# Patient Record
Sex: Female | Born: 1975
Health system: Southern US, Community
[De-identification: ages and names within clinical notes are randomized; demographics above are authoritative.]

## PROBLEM LIST (undated history)

## (undated) DIAGNOSIS — T7840XA Allergy, unspecified, initial encounter: Secondary | ICD-10-CM

## (undated) DIAGNOSIS — J302 Other seasonal allergic rhinitis: Secondary | ICD-10-CM

## (undated) DIAGNOSIS — J45909 Unspecified asthma, uncomplicated: Secondary | ICD-10-CM

## (undated) DIAGNOSIS — Z6791 Unspecified blood type, Rh negative: Secondary | ICD-10-CM

## (undated) DIAGNOSIS — F329 Major depressive disorder, single episode, unspecified: Secondary | ICD-10-CM

## (undated) DIAGNOSIS — R51 Headache: Secondary | ICD-10-CM

## (undated) DIAGNOSIS — J42 Unspecified chronic bronchitis: Secondary | ICD-10-CM

## (undated) DIAGNOSIS — M19071 Primary osteoarthritis, right ankle and foot: Secondary | ICD-10-CM

## (undated) DIAGNOSIS — F1910 Other psychoactive substance abuse, uncomplicated: Secondary | ICD-10-CM

## (undated) HISTORY — DX: Allergy, unspecified, initial encounter: T78.40XA

## (undated) HISTORY — DX: Other psychoactive substance abuse, uncomplicated: F19.10

## (undated) HISTORY — DX: Major depressive disorder, single episode, unspecified: F32.9

## (undated) HISTORY — DX: Unspecified blood type, rh negative: Z67.91

## (undated) HISTORY — PX: BREAST BIOPSY: SHX20

## (undated) HISTORY — DX: Headache: R51

---

## 1998-07-12 ENCOUNTER — Encounter: Admission: RE | Admit: 1998-07-12 | Discharge: 1998-07-12 | Payer: Self-pay | Admitting: *Deleted

## 1998-10-07 DIAGNOSIS — F32A Depression, unspecified: Secondary | ICD-10-CM

## 1998-10-07 HISTORY — DX: Depression, unspecified: F32.A

## 2000-10-16 ENCOUNTER — Encounter: Payer: Self-pay | Admitting: Obstetrics & Gynecology

## 2000-10-16 ENCOUNTER — Encounter: Admission: RE | Admit: 2000-10-16 | Discharge: 2000-10-16 | Payer: Self-pay | Admitting: Obstetrics & Gynecology

## 2000-10-16 ENCOUNTER — Emergency Department (HOSPITAL_COMMUNITY): Admission: EM | Admit: 2000-10-16 | Discharge: 2000-10-16 | Payer: Self-pay | Admitting: Emergency Medicine

## 2001-09-03 ENCOUNTER — Emergency Department (HOSPITAL_COMMUNITY): Admission: EM | Admit: 2001-09-03 | Discharge: 2001-09-03 | Payer: Self-pay | Admitting: Emergency Medicine

## 2001-09-03 ENCOUNTER — Encounter: Payer: Self-pay | Admitting: Emergency Medicine

## 2001-12-17 ENCOUNTER — Emergency Department (HOSPITAL_COMMUNITY): Admission: EM | Admit: 2001-12-17 | Discharge: 2001-12-17 | Payer: Self-pay

## 2004-12-18 ENCOUNTER — Emergency Department (HOSPITAL_COMMUNITY): Admission: EM | Admit: 2004-12-18 | Discharge: 2004-12-18 | Payer: Self-pay | Admitting: Emergency Medicine

## 2008-09-20 ENCOUNTER — Emergency Department (HOSPITAL_COMMUNITY): Admission: EM | Admit: 2008-09-20 | Discharge: 2008-09-20 | Payer: Self-pay | Admitting: Emergency Medicine

## 2009-05-22 ENCOUNTER — Encounter: Admission: RE | Admit: 2009-05-22 | Discharge: 2009-05-22 | Payer: Self-pay | Admitting: Obstetrics and Gynecology

## 2011-08-08 ENCOUNTER — Other Ambulatory Visit: Payer: Self-pay | Admitting: Podiatrist

## 2011-08-08 DIAGNOSIS — M25571 Pain in right ankle and joints of right foot: Secondary | ICD-10-CM

## 2011-08-09 ENCOUNTER — Other Ambulatory Visit: Payer: Self-pay

## 2011-08-09 ENCOUNTER — Ambulatory Visit
Admission: RE | Admit: 2011-08-09 | Discharge: 2011-08-09 | Disposition: A | Discharge status: Home / Self Care | Payer: Federal, State, Local not specified - PPO | Source: Ambulatory Visit | Attending: Podiatrist | Admitting: Podiatrist

## 2011-08-09 DIAGNOSIS — M25571 Pain in right ankle and joints of right foot: Secondary | ICD-10-CM

## 2012-02-25 ENCOUNTER — Ambulatory Visit (INDEPENDENT_AMBULATORY_CARE_PROVIDER_SITE_OTHER): Payer: Federal, State, Local not specified - PPO | Admitting: Obstetrics and Gynecology

## 2012-02-25 ENCOUNTER — Encounter: Payer: Self-pay | Admitting: Obstetrics and Gynecology

## 2012-02-25 VITALS — BP 112/72 | Temp 98.1°F | Ht 60.5 in | Wt 179.0 lb

## 2012-02-25 DIAGNOSIS — N644 Mastodynia: Secondary | ICD-10-CM

## 2012-02-25 DIAGNOSIS — N63 Unspecified lump in unspecified breast: Secondary | ICD-10-CM

## 2012-02-25 NOTE — Progress Notes (Signed)
36 YO complains of pain in right breast x 2 weeks. Pain is achy, occurs off & on throughout the day.  No obvious trigger.  Has worked for the [employer] x 8 years with lots  upper body activity.  Denies trauma to breast, FH breast problems or nipple discharge.  Admits to having breast tissue removed from right breast-benign. Denies chest pain or shortness of breath though she does on occasion have "a cough" (alternating years).   O: Chest: clear to auscultation       Breast: right breast: tender lipomatous appearing  mass        at 10 o'clock adjacent to previous breast biopsy scar; no       additional masses, tenderness, retractions, skin changes       or nipple discharge; no adenpathy.        left breast: no masses, discharge, skin changes, nipple       retraction or adenopathy  A:  Right Breast Pain/Mass      S/P Right Breast Mass Excision   P: Diagnostic Mammogram with ultrasound as needed.     Patient may also want a surgical consultation   Rasaan Brotherton, PA-C

## 2012-03-06 ENCOUNTER — Other Ambulatory Visit: Payer: Self-pay | Admitting: Obstetrics and Gynecology

## 2012-03-06 ENCOUNTER — Ambulatory Visit
Admission: RE | Admit: 2012-03-06 | Discharge: 2012-03-06 | Disposition: A | Discharge status: Home / Self Care | Payer: Federal, State, Local not specified - PPO | Source: Ambulatory Visit | Attending: Obstetrics and Gynecology | Admitting: Obstetrics and Gynecology

## 2012-03-06 DIAGNOSIS — N644 Mastodynia: Secondary | ICD-10-CM

## 2012-03-06 DIAGNOSIS — N63 Unspecified lump in unspecified breast: Secondary | ICD-10-CM

## 2012-03-11 ENCOUNTER — Ambulatory Visit: Payer: Self-pay | Admitting: Obstetrics and Gynecology

## 2012-04-28 ENCOUNTER — Encounter: Payer: Self-pay | Admitting: Obstetrics and Gynecology

## 2012-04-28 ENCOUNTER — Ambulatory Visit (INDEPENDENT_AMBULATORY_CARE_PROVIDER_SITE_OTHER): Payer: Federal, State, Local not specified - PPO | Admitting: Obstetrics and Gynecology

## 2012-04-28 VITALS — BP 118/72 | Ht 60.0 in | Wt 180.0 lb

## 2012-04-28 DIAGNOSIS — Z01419 Encounter for gynecological examination (general) (routine) without abnormal findings: Secondary | ICD-10-CM

## 2012-04-28 NOTE — Progress Notes (Signed)
Last Pap: 09/20/2010  WNL: Yes Regular Periods:yes Contraception: none   Monthly Breast exam:yes Tetanus<79yrs:?  Nl.Bladder Function:yes Daily BMs:yes Healthy Diet:no Calcium:yes Mammogram:yes Date of Mammogram: 03/06/2012 Exercise:no Have often Exercise: no  Seatbelt: yes Abuse at home: no Stressful work:no Sigmoid-colonoscopy: no  BP 118/72  Ht 5' (1.524 m)  Wt 180 lb (81.647 kg)  BMI 35.15 kg/m2  LMP 04/10/2012 Pt without complaints Physical Examination: General appearance - alert, well appearing, and in no distress Mental status - normal mood, behavior, speech, dress, motor activity, and thought processes Neck - supple, no significant adenopathy, thyroid exam: thyroid is normal in size without nodules or tenderness Chest - clear to auscultation, no wheezes, rales or rhonchi, symmetric air entry Heart - normal rate and regular rhythm Abdomen - soft, nontender, nondistended, no masses or organomegaly Breasts - breasts appear normal, no suspicious masses, no skin or nipple changes or axillary nodes Pelvic - normal external genitalia, vulva, vagina, cervix, uterus and adnexa Rectal - normal rectal, no masses, rectal exam not indicated Back exam - full range of motion, no tenderness, palpable spasm or pain on motion Neurological - alert, oriented, normal speech, no focal findings or movement disorder noted Musculoskeletal - no joint tenderness, deformity or swelling Extremities - no edema, redness or tenderness in the calves or thighs Skin - normal coloration and turgor, no rashes, no suspicious skin lesions noted Routine exam Pap sent yes Mammogram due no pt is abstinant RT 1 yr  Bone Density: No PCP: none  Change in PMH: unchanged Change in BJY:[id number]

## 2012-04-29 LAB — PAP IG W/ RFLX HPV ASCU

## 2012-10-07 DIAGNOSIS — N63 Unspecified lump in unspecified breast: Secondary | ICD-10-CM | POA: Insufficient documentation

## 2012-10-10 ENCOUNTER — Encounter (HOSPITAL_COMMUNITY): Payer: Self-pay | Admitting: *Deleted

## 2012-10-10 ENCOUNTER — Emergency Department (HOSPITAL_COMMUNITY)
Admission: EM | Admit: 2012-10-10 | Discharge: 2012-10-10 | Disposition: A | Discharge status: Home / Self Care | Payer: Federal, State, Local not specified - PPO | Source: Home / Self Care

## 2012-10-10 DIAGNOSIS — J9801 Acute bronchospasm: Secondary | ICD-10-CM

## 2012-10-10 DIAGNOSIS — R0982 Postnasal drip: Secondary | ICD-10-CM

## 2012-10-10 DIAGNOSIS — J069 Acute upper respiratory infection, unspecified: Secondary | ICD-10-CM

## 2012-10-10 MED ORDER — ALBUTEROL SULFATE HFA 108 (90 BASE) MCG/ACT IN AERS: 1.0000 | INHALATION_SPRAY | Freq: Four times a day (QID) | RESPIRATORY_TRACT | Status: DC | PRN

## 2012-10-10 NOTE — ED Notes (Signed)
Has been taking Dayquil and Alka-Seltzer Plus.

## 2012-10-10 NOTE — ED Notes (Signed)
On Monday started with back ache, body aches, scratchy throat, slightly productive cough without fevers.  All sxs have gone away except continued cough.  States she feels "clammy".  BBS clear.

## 2012-10-10 NOTE — ED Provider Notes (Signed)
History     CSN: [id number]  Arrival date & time 10/10/12  1120   None     Chief Complaint  Patient presents with  . Cough    (Consider location/radiation/quality/duration/timing/severity/associated sxs/prior treatment) HPI Comments: 37 year old female who 6 days ago had myalgias and scratchy throat. She is improved with the symptoms but continues to have a stuffy nose and a cough. Shelf and feels cold. Denies recent fevers.   Past Medical History  Diagnosis Date  . Yeast infection   . Bacterial infection   . Blood type, Rh negative     History reviewed. No pertinent past surgical history.  Family History  Problem Relation Age of Onset  . Hypertension Paternal Grandmother   . Diabetes Paternal Grandmother   . Heart disease Maternal Grandmother     9 bypass surgery  . Cancer Maternal Grandmother     mouth  . Sickle cell trait Father   . Seizures Father   . Thyroid disease Mother     as a child  . Hypertension Mother   . Anemia Mother   . Migraines Mother   . Hernia Mother   . Arthritis Maternal Aunt     History  Substance Use Topics  . Smoking status: Never Smoker   . Smokeless tobacco: Never Used  . Alcohol Use: Yes     Comment: Drinks 3x daily    OB History    Grav Para Term Preterm Abortions TAB SAB Ect Mult Living   9 0   8  1   0      Review of Systems  Constitutional: Negative.  Negative for fever, chills, activity change, appetite change and fatigue.  HENT: Positive for rhinorrhea. Negative for congestion, sore throat, facial swelling, neck pain, neck stiffness and postnasal drip.         As per history of present illness  Eyes: Negative.   Respiratory: Negative.   Cardiovascular: Negative.   Skin: Negative for pallor and rash.  Neurological: Negative.     Allergies  Review of patient's allergies indicates no known allergies.  Home Medications   Current Outpatient Rx  Name  Route  Sig  Dispense  Refill  . ALBUTEROL SULFATE HFA 108 (90  BASE) MCG/ACT IN AERS   Inhalation   Inhale 1-2 puffs into the lungs every 6 (six) hours as needed for wheezing.   1 Inhaler   0   . ONE-DAILY MULTI VITAMINS PO TABS   Oral   Take 1 tablet by mouth daily.           BP 131/83  Pulse 83  Temp 98.9 F (37.2 C) (Oral)  Resp 18  SpO2 100%  LMP 10/06/2012  Physical Exam  Nursing note and vitals reviewed. Constitutional: She is oriented to person, place, and time. She appears well-developed and well-nourished. No distress.  HENT:  Head: Normocephalic.       Minor erythema of the posterior oropharynx. More and streaky type fashion. No exudates or edema Clear PND Bilateral TMs are normal  Eyes: Conjunctivae normal and EOM are normal.  Neck: Normal range of motion. Neck supple.  Cardiovascular: Normal rate and regular rhythm.   No murmur heard. Pulmonary/Chest: Effort normal and breath sounds normal. No respiratory distress. She has no rales.       With cough and forced expiration there is a faint and expiratory coarseness.  Musculoskeletal: Normal range of motion.  Lymphadenopathy:    She has no cervical adenopathy.  Neurological: She  is alert and oriented to person, place, and time. No cranial nerve deficit.  Skin: Skin is warm and dry.  Psychiatric: She has a normal mood and affect.    ED Course  Procedures (including critical care time)  Labs Reviewed - No data to display No results found.   1. URI (upper respiratory infection)   2. Bronchospasm   3. PND (post-nasal drip)       MDM  Patient has minor bronchospasm we will treat with albuterol HFA 2 puffs every 4-6 hours when necessary cough and wheeze. Claritin 10 mg once daily for drainage Tylenol every 4 hours as needed for discomfort or fever For any worsening, new symptoms or problems may return. Patient is discharged in a stable condition.         Hayden Rasmussen, NP 10/10/12 1215

## 2012-10-13 NOTE — ED Provider Notes (Signed)
Medical screening examination/treatment/procedure(s) were performed by resident physician or non-physician practitioner and as supervising physician I was immediately available for consultation/collaboration.   Barkley Bruns MD.    Linna Hoff, MD 10/13/12 [phone]

## 2013-01-27 ENCOUNTER — Ambulatory Visit (INDEPENDENT_AMBULATORY_CARE_PROVIDER_SITE_OTHER): Payer: Federal, State, Local not specified - PPO | Admitting: Emergency Medicine

## 2013-01-27 VITALS — BP 108/78 | HR 98 | Temp 99.0°F | Resp 16 | Ht 60.5 in | Wt 189.8 lb

## 2013-01-27 DIAGNOSIS — Z0000 Encounter for general adult medical examination without abnormal findings: Secondary | ICD-10-CM

## 2013-01-27 DIAGNOSIS — I8390 Asymptomatic varicose veins of unspecified lower extremity: Secondary | ICD-10-CM

## 2013-01-27 DIAGNOSIS — Z789 Other specified health status: Secondary | ICD-10-CM

## 2013-01-27 DIAGNOSIS — R002 Palpitations: Secondary | ICD-10-CM

## 2013-01-27 DIAGNOSIS — F1010 Alcohol abuse, uncomplicated: Secondary | ICD-10-CM

## 2013-01-27 DIAGNOSIS — N898 Other specified noninflammatory disorders of vagina: Secondary | ICD-10-CM

## 2013-01-27 DIAGNOSIS — N939 Abnormal uterine and vaginal bleeding, unspecified: Secondary | ICD-10-CM

## 2013-01-27 DIAGNOSIS — R200 Anesthesia of skin: Secondary | ICD-10-CM

## 2013-01-27 DIAGNOSIS — D72829 Elevated white blood cell count, unspecified: Secondary | ICD-10-CM

## 2013-01-27 LAB — PROTIME-INR: INR: 0.95 (ref <1.50)

## 2013-01-27 LAB — COMPREHENSIVE METABOLIC PANEL
ALT: 15 U/L (ref 0–35)
Albumin: 4.4 g/dL (ref 3.5–5.2)
CO2: 21 mEq/L (ref 19–32)
Calcium: 9.8 mg/dL (ref 8.4–10.5)
Chloride: 100 mEq/L (ref 96–112)
Glucose, Bld: 89 mg/dL (ref 70–99)
Potassium: 3.8 mEq/L (ref 3.5–5.3)
Sodium: 132 mEq/L — ABNORMAL LOW (ref 135–145)
Total Bilirubin: 0.7 mg/dL (ref 0.3–1.2)
Total Protein: 8 g/dL (ref 6.0–8.3)

## 2013-01-27 LAB — POCT CBC
Granulocyte percent: 73.7 %G (ref 37–80)
Hemoglobin: 12.6 g/dL (ref 12.2–16.2)
MCH, POC: 26.6 pg — AB (ref 27–31.2)
MCV: 82.8 fL (ref 80–97)
MPV: 10.1 fL (ref 0–99.8)
RBC: 4.73 M/uL (ref 4.04–5.48)
WBC: 12.8 10*3/uL — AB (ref 4.6–10.2)

## 2013-01-27 LAB — POCT URINALYSIS DIPSTICK
Glucose, UA: NEGATIVE
Leukocytes, UA: NEGATIVE
Spec Grav, UA: >=1.03
Urobilinogen, UA: 0.2

## 2013-01-27 LAB — POCT UA - MICROSCOPIC ONLY: Yeast, UA: NEGATIVE

## 2013-01-27 LAB — LIPID PANEL
Cholesterol: 188 mg/dL (ref 0–200)
Triglycerides: 92 mg/dL (ref <150)

## 2013-01-27 LAB — LIPASE: Lipase: 19 U/L (ref 0–75)

## 2013-01-27 LAB — AMYLASE: Amylase: 61 U/L (ref 0–105)

## 2013-01-27 NOTE — Progress Notes (Addendum)
Subjective:    Patient ID: Sherri Buckley, female    DOB: Oct 29, 1975, 37 y.o.   MRN: [id number]  HPI Patient here today for a physical exam.  Patient states she does see an OBGYN for pap exams.  Patient concerned with vericose veins on her legs. Complains of palpitations also. EKG obtained in office today to evaluate this. Has appointment next week for endometrial biopsy, advised to avoid aspirin or NSAIDs until after this is done.    Review of Systems     Objective:   Physical Exam HEENT exam is unremarkable TMs are clear. The nose is normal. Throat is normal to chest is clear to both auscultation and percussion heart regular rate without murmurs rubs or gallops the abdomen is soft without tenderness to extremity exam reveals multiple spider varicosities of both legs. Results for orders placed in visit on 01/27/13  POCT CBC      Result Value Range   WBC 12.8 (*) 4.6 - 10.2 K/uL   Lymph, poc 2.5  0.6 - 3.4   POC LYMPH PERCENT 19.5  10 - 50 %L   MID (cbc) 0.9  0 - 0.9   POC MID % 6.8  0 - 12 %M   POC Granulocyte 9.4 (*) 2 - 6.9   Granulocyte percent 73.7  37 - 80 %G   RBC 4.73  4.04 - 5.48 M/uL   Hemoglobin 12.6  12.2 - 16.2 g/dL   HCT, POC 40.9  81.1 - 47.9 %   MCV 82.8  80 - 97 fL   MCH, POC 26.6 (*) 27 - 31.2 pg   MCHC 32.1  31.8 - 35.4 g/dL   RDW, POC 91.4     Platelet Count, POC 322  142 - 424 K/uL   MPV 10.1  0 - 99.8 fL  GLUCOSE, POCT (MANUAL RESULT ENTRY)      Result Value Range   POC Glucose 82  70 - 99 mg/dl  POCT URINALYSIS DIPSTICK      Result Value Range   Color, UA yellow     Clarity, UA hazy     Glucose, UA neg     Bilirubin, UA neg     Ketones, UA 40     Spec Grav, UA >=1.030     Blood, UA neg     pH, UA 5.5     Protein, UA tr     Urobilinogen, UA 0.2     Nitrite, UA neg     Leukocytes, UA Negative    POCT UA - MICROSCOPIC ONLY      Result Value Range   WBC, Ur, HPF, POC 1-3     RBC, urine, microscopic 0-2     Bacteria, U Microscopic small     Mucus, UA small     Epithelial cells, urine per micros 3-6     Crystals, Ur, HPF, POC neg     Casts, Ur, LPF, POC neg     Yeast, UA neg     EKG NSR      Assessment & Plan:  Discussed with patient she needs to decrease alcohol intake. Encouraged AA meetings, advised to speak to substance abuse professional and try to eliminate the alcohol intake. Patient will check status of last Tdap and contact our office if she is due for this. CT scan of head to evaluate her complaints of numbness of arms and legs. It is unclear to me why her white count is up. She does not have any signs  of infection. Daughter past reviewed on her blood work as well as routine labs that were done .

## 2013-01-28 ENCOUNTER — Emergency Department (HOSPITAL_COMMUNITY)
Admission: EM | Admit: 2013-01-28 | Discharge: 2013-01-28 | Disposition: A | Discharge status: Home / Self Care | Payer: Federal, State, Local not specified - PPO | Attending: Emergency Medicine | Admitting: Emergency Medicine

## 2013-01-28 ENCOUNTER — Ambulatory Visit
Admission: RE | Admit: 2013-01-28 | Discharge: 2013-01-28 | Disposition: A | Discharge status: Home / Self Care | Payer: Federal, State, Local not specified - PPO | Source: Ambulatory Visit | Attending: Emergency Medicine | Admitting: Emergency Medicine

## 2013-01-28 ENCOUNTER — Encounter: Payer: Self-pay | Admitting: Family Medicine

## 2013-01-28 ENCOUNTER — Encounter (HOSPITAL_COMMUNITY): Payer: Self-pay

## 2013-01-28 DIAGNOSIS — Z8619 Personal history of other infectious and parasitic diseases: Secondary | ICD-10-CM | POA: Insufficient documentation

## 2013-01-28 DIAGNOSIS — R05 Cough: Secondary | ICD-10-CM | POA: Insufficient documentation

## 2013-01-28 DIAGNOSIS — Z8659 Personal history of other mental and behavioral disorders: Secondary | ICD-10-CM | POA: Insufficient documentation

## 2013-01-28 DIAGNOSIS — R059 Cough, unspecified: Secondary | ICD-10-CM | POA: Insufficient documentation

## 2013-01-28 DIAGNOSIS — R200 Anesthesia of skin: Secondary | ICD-10-CM

## 2013-01-28 DIAGNOSIS — R209 Unspecified disturbances of skin sensation: Secondary | ICD-10-CM | POA: Insufficient documentation

## 2013-01-28 DIAGNOSIS — IMO0001 Reserved for inherently not codable concepts without codable children: Secondary | ICD-10-CM | POA: Insufficient documentation

## 2013-01-28 DIAGNOSIS — F1910 Other psychoactive substance abuse, uncomplicated: Secondary | ICD-10-CM | POA: Insufficient documentation

## 2013-01-28 DIAGNOSIS — R202 Paresthesia of skin: Secondary | ICD-10-CM

## 2013-01-28 DIAGNOSIS — R079 Chest pain, unspecified: Secondary | ICD-10-CM | POA: Insufficient documentation

## 2013-01-28 DIAGNOSIS — Z8739 Personal history of other diseases of the musculoskeletal system and connective tissue: Secondary | ICD-10-CM | POA: Insufficient documentation

## 2013-01-28 LAB — SICKLE CELL SCREEN: Sickle Cell Screen: NEGATIVE

## 2013-01-28 LAB — PATHOLOGIST SMEAR REVIEW

## 2013-01-28 MED ORDER — ASPIRIN EC 81 MG PO TBEC: 81.0000 mg | DELAYED_RELEASE_TABLET | Freq: Every day | ORAL | Status: DC

## 2013-01-28 NOTE — ED Provider Notes (Signed)
I saw and evaluated the patient, reviewed the resident's note and I agree with the findings and plan.  I personally evaluated the ECG and agree with the interpretation of the resident  The patient is well-appearing.  She has a completely normal neurologic exam at this time.  She does report some abnormal sensations in her left arm and leg which are described as "tingling".  She has no numbness to pinprick or light touch on exam.  She does not need an MRI at this time.  I do not believe this to be a stroke.  She was referred to neurology for ongoing workup.  Home on an aspirin.   Lyanne Co, MD 01/28/13 1534

## 2013-01-28 NOTE — ED Notes (Signed)
Pt c/o numbness to left side of face and arm. Dull and sharp sensation intact.

## 2013-01-28 NOTE — ED Notes (Signed)
Pt presents with 2 day h/o numbness to L side.  Pt reports she went to Urgent Care yesterday morning "for my annual exam", told provider about the numbness and was referred for CT scan of which she just had.  Pt reports she had abnormal vaginal bleeding x 2 weeks ago "with blood clots" and is scheduled for biopsy 4/29.  Pt reports she came here "because I got scared and didn't want to wait for the results", reports labs showed slightly low sodium and elevated WBC.

## 2013-01-28 NOTE — ED Provider Notes (Signed)
History     CSN: [id number]  Arrival date & time 01/28/13  1334   First MD Initiated Contact with Patient 01/28/13 1342      No chief complaint on file.   (Consider location/radiation/quality/duration/timing/severity/associated sxs/prior treatment) HPI  Patient presents with left sided face, arm, and leg tingling.  The arm tingling has been going on several days but the face tingling is new.  She says she saw Dr. Cleta Alberts for a physical yesterday and complained about this as well as palpitations.  He ordered a CT head which she had done.  She says the tingling in her face made her worry about a stroke so she came to the ER after the CT scan.  She denies weakness in left arm or leg, and denies facial droop, difficulty speaking, or other focal neurological deficits. She endorses several other complaints on ROS- including cough, mild chest pain, muscle aches.  She says that she is a heavy drinker but has been cutting back lately.  She says she used to drink 1/2 liter of vodka in 2 days.  She says she has been cutting back, drinking moscoto wine over the past few weeks but has not had a drink in 3 days. She says her sodium was low yesterday at Dr. Ellis Parents office and her white count was mildly elevated.   Past Medical History  Diagnosis Date  . Yeast infection   . Bacterial infection   . Blood type, Rh negative   . Allergy   . Arthritis   . Depression   . Substance abuse     Past Surgical History  Procedure Laterality Date  . Breast surgery      Family History  Problem Relation Age of Onset  . Hypertension Paternal Grandmother   . Diabetes Paternal Grandmother   . Heart disease Maternal Grandmother     9 bypass surgery  . Cancer Maternal Grandmother     mouth  . Sickle cell trait Father   . Seizures Father   . Thyroid disease Mother     as a child  . Hypertension Mother   . Anemia Mother   . Migraines Mother   . Hernia Mother   . Arthritis Maternal Aunt     History   Substance Use Topics  . Smoking status: Never Smoker   . Smokeless tobacco: Never Used  . Alcohol Use: Yes     Comment: Drinks 3x daily    OB History   Grav Para Term Preterm Abortions TAB SAB Ect Mult Living   9 0   8  1   0      Review of Systems See HPI Allergies  Review of patient's allergies indicates no known allergies.  Home Medications  No current outpatient prescriptions on file.  BP 140/85  Pulse 84  Temp(Src) 98.7 F (37.1 C) (Oral)  Resp 18  SpO2 99%  LMP 12/31/2012  Physical Exam  Constitutional: She is oriented to person, place, and time. She appears well-developed and well-nourished. No distress.  HENT:  Head: Normocephalic and atraumatic.  Mouth/Throat: Oropharynx is clear and moist.  Eyes: EOM are normal. Pupils are equal, round, and reactive to light.  Neck: Normal range of motion. Neck supple.  Cardiovascular: Normal rate, regular rhythm and normal heart sounds.   Pulmonary/Chest: Effort normal and breath sounds normal. She has no rales.  Abdominal: Soft. Bowel sounds are normal.  Musculoskeletal: She exhibits no edema.  Neurological: She is alert and oriented to person, place, and  time. She has normal strength. No cranial nerve deficit or sensory deficit.  Skin: No rash noted.    ED Course  Procedures (including critical care time)  Labs Reviewed - No data to display Ct Head Wo Contrast  01/28/2013  *RADIOLOGY REPORT*  Clinical Data: Numbness in extremities.  Headache.  CT HEAD WITHOUT CONTRAST  Technique:  Contiguous axial images were obtained from the base of the skull through the vertex without contrast.  Comparison: None  Findings: Ventricle size is normal.  Negative for intracranial hemorrhage.  Negative for infarct or mass lesion.  No fluid collection or shift of the midline structures.  No acute bony abnormality.  There is mucosal edema in the paranasal sinuses.  IMPRESSION: Normal CT brain.  Mucosal disease in the ethmoid sinuses.    Original Report Authenticated By: Janeece Riggers, M.D.     Date: 01/28/2013  Rate: 87  Rhythm: normal sinus rhythm  QRS Axis: normal  Intervals: normal  ST/T Wave abnormalities: normal  Conduction Disutrbances:none  Narrative Interpretation: Normal EKG  Old EKG Reviewed: unchanged    1. Tingling sensation       MDM  Patient with normal neurologic exam, CT head normal.  Will have her start ASA 81 daily and follow up with Neurology.        Ardyth Gal, MD 01/28/13 1426

## 2013-01-29 ENCOUNTER — Telehealth: Payer: Self-pay

## 2013-01-29 NOTE — Telephone Encounter (Signed)
Pt would like a copy of her ct scan results mailed to her home address  Best number [phone]

## 2013-01-29 NOTE — Telephone Encounter (Signed)
done

## 2013-02-02 ENCOUNTER — Other Ambulatory Visit: Payer: Self-pay | Admitting: Obstetrics and Gynecology

## 2013-02-05 ENCOUNTER — Ambulatory Visit (INDEPENDENT_AMBULATORY_CARE_PROVIDER_SITE_OTHER): Payer: Federal, State, Local not specified - PPO | Admitting: Neurology

## 2013-02-05 ENCOUNTER — Encounter: Payer: Self-pay | Admitting: Neurology

## 2013-02-05 VITALS — BP 145/86 | HR 80 | Temp 98.5°F | Ht 62.0 in | Wt 195.0 lb

## 2013-02-05 DIAGNOSIS — R209 Unspecified disturbances of skin sensation: Secondary | ICD-10-CM

## 2013-02-05 DIAGNOSIS — IMO0001 Reserved for inherently not codable concepts without codable children: Secondary | ICD-10-CM
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
## 2013-02-05 NOTE — Patient Instructions (Addendum)
I think overall you are doing fairly well but I do want to suggest a few things today:  I will order an MRI brain and blood work today.   Remember to drink plenty of fluid, eat healthy meals and do not skip any meals. Try to eat protein with a every meal and eat a healthy snack such as fruit or nuts in between meals. Try to keep a regular sleep-wake schedule and try to exercise daily, particularly in the form of walking, 20-30 minutes a day, if you can.   As far as your medications are concerned, I would like to suggest no changes today.   I would like to see you back in 3 months, sooner if we need to. Please call us with any interim questions, concerns, problems, updates or refill requests.  Please also call us for any test results so we can go over those with you on the phone. Brett Canales is my clinical assistant and will answer any of your questions and relay your messages to me and also relay most of my messages to you.  Our phone number is [phone]. We also have an after hours call service for urgent matters and there is a physician on-call for urgent questions. For any emergencies you know to call 911 or go to the nearest emergency room.

## 2013-02-08 LAB — COMPREHENSIVE METABOLIC PANEL
AST: 21 IU/L (ref 0–40)
Albumin: 4 g/dL (ref 3.5–5.5)
Alkaline Phosphatase: 44 IU/L (ref 39–117)
BUN/Creatinine Ratio: 19 (ref 8–20)
BUN: 13 mg/dL (ref 6–20)
CO2: 25 mmol/L (ref 19–28)
Chloride: 107 mmol/L (ref 97–108)
GFR calc Af Amer: 127 mL/min/{1.73_m2} (ref >59)
Sodium: 142 mmol/L (ref 134–144)
Total Bilirubin: 0.3 mg/dL (ref 0.0–1.2)

## 2013-02-08 LAB — CBC WITH DIFFERENTIAL
Basos: 0 % (ref 0–3)
Eos: 6 % — ABNORMAL HIGH (ref 0–5)
Hemoglobin: 11.5 g/dL (ref 11.1–15.9)
Immature Grans (Abs): 0 10*3/uL (ref 0.0–0.1)
Immature Granulocytes: 0 % (ref 0–2)
Monocytes: 8 % (ref 4–12)
Neutrophils Relative %: 63 % (ref 40–74)
Platelets: 347 10*3/uL (ref 155–379)
RBC: 4.19 x10E6/uL (ref 3.77–5.28)
WBC: 8.7 10*3/uL (ref 3.4–10.8)

## 2013-02-08 LAB — VITAMIN D 25 HYDROXY (VIT D DEFICIENCY, FRACTURES): Vit D, 25-Hydroxy: 18.9 ng/mL — ABNORMAL LOW (ref 30.0–100.0)

## 2013-02-08 LAB — B12 AND FOLATE PANEL: Vitamin B-12: 716 pg/mL (ref 211–946)

## 2013-02-08 LAB — RPR: RPR: NONREACTIVE

## 2013-02-08 LAB — AMMONIA: Ammonia: 31 ug/dL (ref 19–87)

## 2013-02-08 LAB — CK TOTAL AND CKMB (NOT AT ARMC): CK-MB Index: 5 ng/mL — ABNORMAL HIGH (ref 0.0–2.9)

## 2013-02-08 LAB — C-REACTIVE PROTEIN: CRP: 2.5 mg/L (ref 0.0–4.9)

## 2013-02-08 NOTE — Progress Notes (Signed)
Quick Note:  I just talked with the patient regarding her blood work results. Her sodium has normalized and her white count has normalized. Her B12, TSH and ammonia, CBC and CMP look fine. She does have a CK elevation to 206 and a CK-MB elevation to 5.0. She denies any recent falls or her hurting herself or bumping into anything, she denies chest pain or shortness of breath. She had a recent EKG in the emergency room. She is advised to find a primary care physician and get her cardiac enzymes be checked, repeat EKG and a general checkup as well as a cardiac checkup done. She indicates that she will go see somebody tomorrow that was recommended to her. Once she has established her appointment she is advised to call us so we can fax her test results. In addition. She was found to have low vitamin D level and is advised to start over-the-counter vitamin D in it but may need a prescription from her new PCP for that as well. She understands. She also indicates that she was previously diagnosed with low vitamin D but did not followup with a prescription for that.  She has not yet been contacted for her brain MRI and I told her I would follow up on that. ______

## 2013-02-09 ENCOUNTER — Ambulatory Visit (INDEPENDENT_AMBULATORY_CARE_PROVIDER_SITE_OTHER): Payer: Federal, State, Local not specified - PPO | Admitting: Emergency Medicine

## 2013-02-09 VITALS — BP 136/88 | HR 69 | Temp 99.3°F | Resp 18 | Wt 195.0 lb

## 2013-02-09 DIAGNOSIS — E559 Vitamin D deficiency, unspecified: Secondary | ICD-10-CM

## 2013-02-09 DIAGNOSIS — IMO0001 Reserved for inherently not codable concepts without codable children: Secondary | ICD-10-CM

## 2013-02-09 DIAGNOSIS — F1010 Alcohol abuse, uncomplicated: Secondary | ICD-10-CM | POA: Insufficient documentation

## 2013-02-09 LAB — COMPREHENSIVE METABOLIC PANEL
ALT: 13 U/L (ref 0–35)
AST: 16 U/L (ref 0–37)
Alkaline Phosphatase: 40 U/L (ref 39–117)
BUN: 11 mg/dL (ref 6–23)
Calcium: 9.2 mg/dL (ref 8.4–10.5)
Creat: 0.68 mg/dL (ref 0.50–1.10)
Total Bilirubin: 0.7 mg/dL (ref 0.3–1.2)

## 2013-02-09 LAB — CK TOTAL AND CKMB (NOT AT ARMC)
CK, MB: 3.1 ng/mL (ref 0.3–4.0)
Relative Index: 1.9 (ref 0.0–2.5)
Total CK: 159 U/L (ref 7–177)

## 2013-02-09 MED ORDER — ERGOCALCIFEROL 1.25 MG (50000 UT) PO CAPS: 50000.0000 [IU] | ORAL_CAPSULE | ORAL | Status: DC

## 2013-02-09 NOTE — Progress Notes (Signed)
[facility] [address] [place] [address] [phone]  Date:  02/09/2013   Name:  Sherri Buckley   DOB:  10/12/1975   MRN:  [id number]  PCP:  Pcp Not In System    Chief Complaint: Follow-up   History of Present Illness:  Sherri Buckley is a 37 y.o. very pleasant female patient who presents with the following:  Multiple concerns about labs and recent visits.  Had a CK/MB done by her neurologist and they were elevated in the absence of other symptoms.  She is undergoing a workup for headaches and weakness by the neurologist that has thus far been normal.    Patient Active Problem List   Diagnosis Date Noted  . Hypovitaminosis D 02/09/2013  . Alcohol abuse 02/09/2013    Past Medical History  Diagnosis Date  . Yeast infection   . Bacterial infection   . Blood type, Rh negative   . Allergy   . Arthritis   . Depression   . Substance abuse   . Headache     Past Surgical History  Procedure Laterality Date  . Breast surgery      History  Substance Use Topics  . Smoking status: Never Smoker   . Smokeless tobacco: Never Used  . Alcohol Use: Yes     Comment: Drinks 3x daily    Family History  Problem Relation Age of Onset  . Hypertension Paternal Grandmother   . Diabetes Paternal Grandmother   . Heart disease Maternal Grandmother     9 bypass surgery  . Cancer Maternal Grandmother     mouth  . Sickle cell trait Father   . Seizures Father   . Thyroid disease Mother     as a child  . Hypertension Mother   . Anemia Mother   . Migraines Mother   . Hernia Mother   . Arthritis Maternal Aunt     No Known Allergies  Medication list has been reviewed and updated.  Current Outpatient Prescriptions on File Prior to Visit  Medication Sig Dispense Refill  . aspirin EC 81 MG tablet Take 1 tablet (81 mg total) by mouth daily.  30 tablet  0   No current facility-administered medications on file prior to visit.    Review of  Systems:  As per HPI, otherwise negative.    Physical Examination: Filed Vitals:   02/09/13 1530  BP: 136/88  Pulse: 69  Temp: 99.3 F (37.4 C)  Resp: 18   Filed Vitals:   02/09/13 1530  Weight: 195 lb (88.451 kg)   Body mass index is 35.66 kg/(m^2). Ideal Body Weight:     GEN: WDWN, NAD, Non-toxic, Alert & Oriented x 3 HEENT: Atraumatic, Normocephalic.  Ears and Nose: No external deformity. EXTR: No clubbing/cyanosis/edema NEURO: Normal gait.  PSYCH: Normally interactive. Conversant. Not depressed or anxious appearing.  Calm demeanor.    Assessment and Plan: Hypovitaminosis d Hyponatremia Follow up based on labs Signed,  Phillips Odor, MD   Looks to me as though her elevation in ck and mb are related to lifting at work. Will repeat now.  Low risk for CAD.

## 2013-02-10 ENCOUNTER — Ambulatory Visit (INDEPENDENT_AMBULATORY_CARE_PROVIDER_SITE_OTHER): Payer: Federal, State, Local not specified - PPO

## 2013-02-10 DIAGNOSIS — IMO0001 Reserved for inherently not codable concepts without codable children: Secondary | ICD-10-CM

## 2013-02-10 DIAGNOSIS — R209 Unspecified disturbances of skin sensation: Secondary | ICD-10-CM

## 2013-02-11 ENCOUNTER — Telehealth: Payer: Self-pay

## 2013-02-11 NOTE — Telephone Encounter (Signed)
PATIENT IS REQUESTING LAB RESULTS. STATES THAT SHE WAS HERE 2 DAYS AGO AND GOT LAB WORK DONE. STATES THAT SHE WAS ALSO HERE 2 WEEKS AGO AND GOT A CALLBACK THE NEXT DAY ABOUT THOSE LAB RESULTS AND SHE WOULD LIKE KNOW WHY THESE HAVE NOT COME BACK YET. STATED THAT SHE WOULD TRY TO BE PATIENT... [phone]

## 2013-02-11 NOTE — Progress Notes (Signed)
Quick Note:  Please call patient back and informed her that her MRI of brain without contrast showed no problems with her brain but there was evidence of sinus disease in both maxillary sinuses, left more than right. She may benefit from treatment of sinusitis if she has symptoms of sinus congestion. Please have her discuss this with her primary care physician. ______

## 2013-02-11 NOTE — Telephone Encounter (Signed)
Called patient. Advised her labs normal to you FYI

## 2013-02-12 ENCOUNTER — Telehealth: Payer: Self-pay | Admitting: Neurology

## 2013-02-12 NOTE — Telephone Encounter (Signed)
Results called to pt per Natividad Brood, CMA.  (MRI and labs)

## 2013-02-12 NOTE — Progress Notes (Signed)
Quick Note:  Spoke with patient and relayed the results of their MRI and labs as well as Dr Teofilo Pod advise or recommendations. The patient was also reminded of any future appointments. The patient understood and had no questions.  ______

## 2013-02-12 NOTE — Telephone Encounter (Signed)
Patient calling to tell us she would like to have the results from her most recent MRI.  Her call back number is:  [phone]

## 2013-02-15 ENCOUNTER — Ambulatory Visit: Payer: Federal, State, Local not specified - PPO | Admitting: Neurology

## 2013-02-16 NOTE — Telephone Encounter (Signed)
Called and left message about MRI results. Stated if any other questions she could call the office back.

## 2013-03-06 ENCOUNTER — Other Ambulatory Visit: Payer: Self-pay | Admitting: Radiology

## 2013-03-06 ENCOUNTER — Ambulatory Visit (INDEPENDENT_AMBULATORY_CARE_PROVIDER_SITE_OTHER): Payer: Federal, State, Local not specified - PPO | Admitting: Family Medicine

## 2013-03-06 VITALS — BP 138/81 | HR 77 | Temp 97.9°F | Resp 16 | Ht 60.5 in | Wt 191.0 lb

## 2013-03-06 DIAGNOSIS — J0100 Acute maxillary sinusitis, unspecified: Secondary | ICD-10-CM

## 2013-03-06 DIAGNOSIS — J309 Allergic rhinitis, unspecified: Secondary | ICD-10-CM

## 2013-03-06 MED ORDER — FLUTICASONE PROPIONATE 50 MCG/ACT NA SUSP: 2.0000 | Freq: Every day | NASAL | Status: DC

## 2013-03-06 NOTE — Progress Notes (Signed)
[address]   [place], [place]  [address]   [phone]  Subjective:    Patient ID: Sherri Buckley, female    DOB: April 23, 1976, 37 y.o.   MRN: [id number]  HPI This 37 y.o. female presents for evaluation of cough, wheezing.  Going to sleep after getting of work, has a wheezing, crackling.  Sneezing, bad cough.  February had bronchitis.  Hears crackling at night.  Onset of cold symptoms in past two weeks.  No fever/chills/sweats.  No ear pain or sore throat.  +sneezing.  +itchy eyes and nose; no medications.  +dry itching eyes.  S/p MRI head and noticed sinusitis; ordered by neurologist for muscle spasms and tingling/prickling that was normal other than sinus congestion; placed on antibiotic but has not started taking it; prescribed two weeks ago.  No headaches.  +rhinorrhea clear  +nasal congestion.  +PND; +coughing; no SOB.  Neurology referred to ENT due to sinusitis on MRI brain.  Evaluated by Dr. Pollyann Kennedy two weeks ago who prescribed the antibiotic; never started.  PCP:  None; wants to establish with [facility].   Review of Systems  Constitutional: Negative for fever, chills, diaphoresis and fatigue.  HENT: Positive for congestion, rhinorrhea, sneezing, voice change and postnasal drip. Negative for ear pain, sore throat, trouble swallowing and sinus pressure.   Eyes: Positive for itching. Negative for redness.  Respiratory: Positive for cough and wheezing. Negative for shortness of breath and stridor.   Gastrointestinal: Negative for nausea, vomiting and diarrhea.  Neurological: Negative for dizziness and headaches.    Past Medical History  Diagnosis Date  . Yeast infection   . Bacterial infection   . Blood type, Rh negative   . Allergy   . Arthritis   . Depression   . Substance abuse   . UVOZDGUY(403.4)     Past Surgical History  Procedure Laterality Date  . Breast surgery      Prior to Admission medications   Medication Sig Start Date End Date Taking? Authorizing Provider    ergocalciferol (VITAMIN D2) 50000 UNITS capsule Take 1 capsule (50,000 Units total) by mouth once a week. 02/09/13  Yes Phillips Odor, MD  fluticasone (FLONASE) 50 MCG/ACT nasal spray Place 2 sprays into the nose daily. 03/06/13   Ethelda Chick, MD    No Known Allergies  History   Social History  . Marital Status: Single    Spouse Name: N/A    Number of Children: N/A  . Years of Education: N/A   Occupational History  .  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 Smoking status: Never Smoker   . Smokeless tobacco: Never Used  . Alcohol Use: Yes     Comment: Drinks 3x daily  . Drug Use: No  . Sexually Active: Not Currently   Other Topics Concern  . Not on file   Social History Narrative  . No narrative on file    Family History  Problem Relation Age of Onset  . Hypertension Paternal Grandmother   . Diabetes Paternal Grandmother   . Heart disease Maternal Grandmother     9 bypass surgery  . Cancer Maternal Grandmother     mouth  . Sickle cell trait Father   . Seizures Father   . Thyroid disease Mother     as a child  . Hypertension Mother   . Anemia Mother   . Migraines Mother   . Hernia Mother   . Arthritis  Maternal Aunt        Objective:   Physical Exam  Nursing note and vitals reviewed. Constitutional: She is oriented to person, place, and time. She appears well-developed and well-nourished. No distress.  HENT:  Head: Normocephalic and atraumatic.  Right Ear: External ear normal.  Left Ear: External ear normal.  Nose: Nose normal.  Mouth/Throat: Mucous membranes are normal. Oropharyngeal exudate and posterior oropharyngeal edema present. No posterior oropharyngeal erythema or tonsillar abscesses.  Eyes: Conjunctivae are normal. Pupils are equal, round, and reactive to light.  Neck: Normal range of motion. Neck supple.  Cardiovascular: Normal rate and regular rhythm.   Pulmonary/Chest: Effort normal and breath sounds  normal.  Neurological: She is alert and oriented to person, place, and time.  Skin: She is not diaphoretic.  Psychiatric: She has a normal mood and affect. Her behavior is normal.        Assessment & Plan:  Sinusitis, acute maxillary  Allergic rhinitis, cause unspecified   1.  Acute maxillary sinusitis:  New.  Start antibiotic prescribed by Dr. Sandford Craze of ENT. 2. Allergic Rhinitis:  New.  Uncontrolled; rx for Flonase provided; also recommend starting Claritin 10mg  daily.  Call in two weeks if crackling in throat/PND not improved.  Meds ordered this encounter  Medications  . fluticasone (FLONASE) 50 MCG/ACT nasal spray    Sig: Place 2 sprays into the nose daily.    Dispense:  16 g    Refill:  6

## 2013-03-06 NOTE — Patient Instructions (Addendum)
1.  Call with name of antibiotic that was prescribed by neurologist. 2.  Purchase Claritin 10mg  one tablet.  Recommend taking Claritin once daily through May and June for allergies. 3.  Start Flonase two sprays in each nostril once daily; recommend taking through May and June for allergies.

## 2013-03-22 ENCOUNTER — Ambulatory Visit: Payer: Federal, State, Local not specified - PPO

## 2013-03-22 ENCOUNTER — Ambulatory Visit (INDEPENDENT_AMBULATORY_CARE_PROVIDER_SITE_OTHER): Payer: Federal, State, Local not specified - PPO | Admitting: Family Medicine

## 2013-03-22 ENCOUNTER — Encounter: Payer: Self-pay | Admitting: Family Medicine

## 2013-03-22 VITALS — BP 113/69 | HR 76 | Temp 98.8°F | Resp 20 | Ht 61.0 in | Wt 191.0 lb

## 2013-03-22 DIAGNOSIS — R05 Cough: Secondary | ICD-10-CM

## 2013-03-22 DIAGNOSIS — R059 Cough, unspecified: Secondary | ICD-10-CM

## 2013-03-22 DIAGNOSIS — J9801 Acute bronchospasm: Secondary | ICD-10-CM

## 2013-03-22 DIAGNOSIS — J209 Acute bronchitis, unspecified: Secondary | ICD-10-CM

## 2013-03-22 DIAGNOSIS — J309 Allergic rhinitis, unspecified: Secondary | ICD-10-CM

## 2013-03-22 MED ORDER — AZITHROMYCIN 250 MG PO TABS: ORAL_TABLET | ORAL | Status: DC

## 2013-03-22 MED ORDER — PREDNISONE 20 MG PO TABS: ORAL_TABLET | ORAL | Status: DC

## 2013-03-22 NOTE — Progress Notes (Signed)
[address]   [place], [place]  [address]   [phone]  Subjective:    Patient ID: Sherri Buckley, female    DOB: 07/25/1976, 37 y.o.   MRN: [id number]  HPI This 37 y.o. female presents for evaluation of cough, wheezing.  Two week follow-up for sinusitis, allergic rhinitis.  Will completed Augmentin therapy x 14 days that was prescribed by Dr. Pollyann Kennedy.  Started Claritin 10mg  daily for past two weeks. Just started Flonase one week ago.  Also taking Cheratussin AC tid for worsening cough, wheezing.  Things got worse one week ago.  Slight fever Tmax 99.3.  No headaches.   No sore throat or ear pain.  No chest pain.  No rhinorrhea; no nasal congestion.  +PND clear and yellowish.  Cough really worsened one week ago.  Cough comes in bouts; early mornings really bad; OK during the day; wheezing occurs in throat.  No chest wheezing.  Breathing is a little labored.  Sneezing and rhinorrhea and nasal congestion improved since last visit.  Brother with bronchitis one week ago.  Has been prescribed Tessalon Perles but has never used; has also been prescribed ProAir but has never used.  No tobacco.  No history of asthma.  Has follow-up with  Dr. Pollyann Kennedy for repeat CT scan of sinuses next week.   Review of Systems  Constitutional: Positive for fever. Negative for chills, diaphoresis and fatigue.  HENT: Positive for postnasal drip. Negative for ear pain, congestion, sore throat, rhinorrhea, sneezing, mouth sores, trouble swallowing, voice change and sinus pressure.   Respiratory: Positive for cough, shortness of breath and wheezing.   Gastrointestinal: Negative for nausea, vomiting and diarrhea.  Neurological: Negative for dizziness, numbness and headaches.    Past Medical History  Diagnosis Date  . Yeast infection   . Bacterial infection   . Blood type, Rh negative   . Allergy   . Arthritis   . Depression   . Substance abuse   . UXLKGMWN(027.2)     Past Surgical History  Procedure Laterality Date    . Breast surgery      Prior to Admission medications   Medication Sig Start Date End Date Taking? Authorizing Provider  amoxicillin-clavulanate (AUGMENTIN) 875-125 MG per tablet Take 1 tablet by mouth 2 (two) times daily.   Yes Historical Provider, MD  ergocalciferol (VITAMIN D2) 50000 UNITS capsule Take 1 capsule (50,000 Units total) by mouth once a week. 02/09/13  Yes Phillips Odor, MD  fluticasone (FLONASE) 50 MCG/ACT nasal spray Place 2 sprays into the nose daily. 03/06/13  Yes Ethelda Chick, MD  guaiFENesin-codeine [facility]) 100-10 MG/5ML syrup Take 5 mLs by mouth 3 (three) times daily as needed for cough.   Yes Historical Provider, MD  loratadine (CLARITIN) 10 MG tablet Take 10 mg by mouth daily.   Yes Historical Provider, MD  azithromycin (ZITHROMAX Z-PAK) 250 MG tablet Take two tablets daily x 1 day then one tablet daily x 4 days 03/22/13   Ethelda Chick, MD  predniSONE (DELTASONE) 20 MG tablet Two tablets daily x 5 days then one tablet daily x 5 days 03/22/13   Ethelda Chick, MD    No Known Allergies  History   Social History  . Marital Status: Single    Spouse Name: N/A    Number of Children: N/A  . Years of Education: N/A   Occupational History  .  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  Smoking status: Never Smoker   . Smokeless tobacco: Never Used  . Alcohol Use: Yes     Comment: Drinks 3x daily  . Drug Use: No  . Sexually Active: Not Currently   Other Topics Concern  . Not on file   Social History Narrative  . No narrative on file    Family History  Problem Relation Age of Onset  . Hypertension Paternal Grandmother   . Diabetes Paternal Grandmother   . Heart disease Maternal Grandmother     9 bypass surgery  . Cancer Maternal Grandmother     mouth  . Sickle cell trait Father   . Seizures Father   . Thyroid disease Mother     as a child  . Hypertension Mother   . Anemia Mother   . Migraines Mother   .  Hernia Mother   . Arthritis Maternal Aunt        Objective:   Physical Exam  Nursing note and vitals reviewed. Constitutional: She is oriented to person, place, and time. She appears well-developed and well-nourished. No distress.  HENT:  Head: Normocephalic and atraumatic.  Right Ear: External ear normal.  Left Ear: External ear normal.  Nose: Nose normal.  Mouth/Throat: Oropharynx is clear and moist.  OP with drainage.  Eyes: Conjunctivae are normal. Pupils are equal, round, and reactive to light.  Neck: Normal range of motion. Neck supple.  Cardiovascular: Normal rate, regular rhythm and normal heart sounds.   Pulmonary/Chest: Effort normal. No respiratory distress. She has wheezes in the right upper field, the right middle field, the right lower field, the left upper field, the left middle field and the left lower field. She has no rhonchi. She has no rales.  Lymphadenopathy:    She has no cervical adenopathy.  Neurological: She is alert and oriented to person, place, and time.  Skin: She is not diaphoretic.  Psychiatric: She has a normal mood and affect. Her behavior is normal.   ALBUTEROL NEBULIZER ADMINISTERED IN OFFICE.    [facility] reading (PRIMARY) by  Dr. Katrinka Blazing. CXR: NAD.   Assessment & Plan:  Cough - Plan: DG Chest 2 View, PR INHAL RX, AIRWAY OBST/DX SPUTUM INDUCT, Pulse oximetry (single)  Bronchospasm - Plan: DG Chest 2 View, PR INHAL RX, AIRWAY OBST/DX SPUTUM INDUCT, Pulse oximetry (single), predniSONE (DELTASONE) 20 MG tablet  Allergic rhinitis, cause unspecified  Acute bronchitis - Plan: azithromycin (ZITHROMAX Z-PAK) 250 MG tablet   1.  Acute bronchitis: New.  Rx for Zpack provided; continue cough syrup and Tessalon Perles PRN. 2.  Bronchospasm:  New.  S/p albuterol nebulizer in office; advised to start Proair qid for next week and then PRN.  Rx for Prednisone also provided. 3.  Allergic Rhinitis: Improving with Flonase and Claritin; advised to continue both  medications. 4. Acute sinusitis: Improving; s/p Augmentin x 14 days; rx for Zpack for bronchitis. Follow-up with Dr. Pollyann Kennedy of ENT next week.  Meds ordered this encounter  Medications  . loratadine (CLARITIN) 10 MG tablet    Sig: Take 10 mg by mouth daily.  Marland Kitchen guaiFENesin-codeine (ROBITUSSIN AC) 100-10 MG/5ML syrup    Sig: Take 5 mLs by mouth 3 (three) times daily as needed for cough.  Marland Kitchen amoxicillin-clavulanate (AUGMENTIN) 875-125 MG per tablet    Sig: Take 1 tablet by mouth 2 (two) times daily.  . predniSONE (DELTASONE) 20 MG tablet    Sig: Two tablets daily x 5 days then one tablet daily x 5 days    Dispense:  15 tablet    Refill:  0  . azithromycin (ZITHROMAX Z-PAK) 250 MG tablet    Sig: Take two tablets daily x 1 day then one tablet daily x 4 days    Dispense:  6 each    Refill:  0

## 2013-03-22 NOTE — Patient Instructions (Addendum)
1.  Continue Claritin and Flonase. 2.  Start ProAir inhaler two inhalations four times daily. 3.  Start Prednisone and Zpack for bronchitis. 4.  Continue cough syrup until gone.  When you run out of cough syrup, start  (Benzonatate) cough pills three times daily as needed for cough.

## 2013-05-07 ENCOUNTER — Other Ambulatory Visit: Payer: Self-pay | Admitting: Obstetrics and Gynecology

## 2013-05-07 DIAGNOSIS — N6311 Unspecified lump in the right breast, upper outer quadrant: Secondary | ICD-10-CM

## 2013-05-08 ENCOUNTER — Encounter (HOSPITAL_COMMUNITY): Payer: Self-pay | Admitting: Emergency Medicine

## 2013-05-08 ENCOUNTER — Emergency Department (HOSPITAL_COMMUNITY): Payer: Federal, State, Local not specified - PPO

## 2013-05-08 ENCOUNTER — Emergency Department (HOSPITAL_COMMUNITY)
Admission: EM | Admit: 2013-05-08 | Discharge: 2013-05-08 | Disposition: A | Discharge status: Home / Self Care | Payer: Federal, State, Local not specified - PPO | Attending: Emergency Medicine | Admitting: Emergency Medicine

## 2013-05-08 DIAGNOSIS — R202 Paresthesia of skin: Secondary | ICD-10-CM

## 2013-05-08 DIAGNOSIS — R29810 Facial weakness: Secondary | ICD-10-CM | POA: Insufficient documentation

## 2013-05-08 DIAGNOSIS — Z8659 Personal history of other mental and behavioral disorders: Secondary | ICD-10-CM | POA: Insufficient documentation

## 2013-05-08 DIAGNOSIS — Z79899 Other long term (current) drug therapy: Secondary | ICD-10-CM | POA: Insufficient documentation

## 2013-05-08 DIAGNOSIS — Z8619 Personal history of other infectious and parasitic diseases: Secondary | ICD-10-CM | POA: Insufficient documentation

## 2013-05-08 DIAGNOSIS — Z3202 Encounter for pregnancy test, result negative: Secondary | ICD-10-CM | POA: Insufficient documentation

## 2013-05-08 DIAGNOSIS — Z8739 Personal history of other diseases of the musculoskeletal system and connective tissue: Secondary | ICD-10-CM | POA: Insufficient documentation

## 2013-05-08 DIAGNOSIS — R209 Unspecified disturbances of skin sensation: Secondary | ICD-10-CM | POA: Insufficient documentation

## 2013-05-08 DIAGNOSIS — R259 Unspecified abnormal involuntary movements: Secondary | ICD-10-CM | POA: Insufficient documentation

## 2013-05-08 DIAGNOSIS — F411 Generalized anxiety disorder: Secondary | ICD-10-CM | POA: Insufficient documentation

## 2013-05-08 LAB — CBC WITH DIFFERENTIAL/PLATELET
Basophils Absolute: 0 10*3/uL (ref 0.0–0.1)
Basophils Relative: 0 % (ref 0–1)
Eosinophils Absolute: 0.2 10*3/uL (ref 0.0–0.7)
MCH: 26.5 pg (ref 26.0–34.0)
MCHC: 36.3 g/dL — ABNORMAL HIGH (ref 30.0–36.0)
Monocytes Absolute: 1.1 10*3/uL — ABNORMAL HIGH (ref 0.1–1.0)
Monocytes Relative: 10 % (ref 3–12)
Neutro Abs: 5.8 10*3/uL (ref 1.7–7.7)
Neutrophils Relative %: 57 % (ref 43–77)
RDW: 14.1 % (ref 11.5–15.5)

## 2013-05-08 LAB — COMPREHENSIVE METABOLIC PANEL
AST: 29 U/L (ref 0–37)
Albumin: 4.1 g/dL (ref 3.5–5.2)
BUN: 13 mg/dL (ref 6–23)
Chloride: 98 mEq/L (ref 96–112)
Creatinine, Ser: 0.78 mg/dL (ref 0.50–1.10)
Potassium: 4.3 mEq/L (ref 3.5–5.1)
Total Bilirubin: 0.9 mg/dL (ref 0.3–1.2)
Total Protein: 8.1 g/dL (ref 6.0–8.3)

## 2013-05-08 LAB — URINALYSIS, ROUTINE W REFLEX MICROSCOPIC
Glucose, UA: NEGATIVE mg/dL
Hgb urine dipstick: NEGATIVE
Leukocytes, UA: NEGATIVE
pH: 5.5 (ref 5.0–8.0)

## 2013-05-08 LAB — PREGNANCY, URINE: Preg Test, Ur: NEGATIVE

## 2013-05-08 NOTE — ED Notes (Signed)
Pt c/o tingling to head since last night, pt states she has a "pricking" sensation x 3 days. Pt states has had CT and MRI in past for same with no etiology found. Pt now feels shaky.

## 2013-05-08 NOTE — ED Provider Notes (Signed)
CSN: [id number]     Arrival date & time 05/08/13  0700 History     First MD Initiated Contact with Patient 05/08/13 [phone]     Chief Complaint  Patient presents with  . parasthesia    (Consider location/radiation/quality/duration/timing/severity/associated sxs/prior Treatment) HPI Comments: Patient presents with "pricking" sensation to her head that has been going on for the past 3 days. She describes this as a tingling sensation that lasted a few seconds at a time and resolves. It is moving all over her head. Since last night she felt a "goose bumps" sensation in concerns her. She feels anxious and shaky. She's had the pricking sensation before in May and so neurology and had a CT scan MRI that showed only sinusitis. She denies any headache, vision change, focal weakness, difficulty breathing, difficulty swallowing or talking. No paresthesias in the arms or legs. She denies any chest pain or shortness of breath. She denies any chronic medical problems.  The history is provided by the patient.    Past Medical History  Diagnosis Date  . Yeast infection   . Bacterial infection   . Blood type, Rh negative   . Allergy   . Arthritis   . Depression   . Substance abuse   . JXBJYNWG(956.2)    Past Surgical History  Procedure Laterality Date  . Breast surgery     Family History  Problem Relation Age of Onset  . Hypertension Paternal Grandmother   . Diabetes Paternal Grandmother   . Heart disease Maternal Grandmother     9 bypass surgery  . Cancer Maternal Grandmother     mouth  . Sickle cell trait Father   . Seizures Father   . Thyroid disease Mother     as a child  . Hypertension Mother   . Anemia Mother   . Migraines Mother   . Hernia Mother   . Arthritis Maternal Aunt    History  Substance Use Topics  . Smoking status: Never Smoker   . Smokeless tobacco: Never Used  . Alcohol Use: Yes     Comment: Drinks 3x daily   OB History   Grav Para Term Preterm Abortions TAB SAB  Ect Mult Living   9 0   8  1   0     Review of Systems  Constitutional: Negative for fever, activity change and appetite change.  HENT: Negative for neck pain and neck stiffness.   Eyes: Negative for visual disturbance.  Respiratory: Negative for cough, chest tightness and shortness of breath.   Cardiovascular: Negative for chest pain.  Gastrointestinal: Negative for nausea, vomiting and abdominal pain.  Genitourinary: Negative for dysuria and hematuria.  Musculoskeletal: Negative for back pain.  Skin: Negative for rash.  Neurological: Positive for facial asymmetry and numbness. Negative for dizziness, speech difficulty and weakness.  Psychiatric/Behavioral: The patient is nervous/anxious.   A complete 10 system review of systems was obtained and all systems are negative except as noted in the HPI and PMH.    Allergies  Review of patient's allergies indicates no known allergies.  Home Medications   Current Outpatient Rx  Name  Route  Sig  Dispense  Refill  . Multiple Vitamin (MULTIVITAMIN WITH MINERALS) TABS   Oral   Take 1 tablet by mouth daily.         . Vitamin D, Ergocalciferol, (DRISDOL) 50000 UNITS CAPS   Oral   Take 50,000 Units by mouth every Saturday.  BP 121/61  Pulse 106  Temp(Src) 99.1 F (37.3 C) (Oral)  Resp 18  Ht 5' (1.524 m)  Wt 186 lb (84.369 kg)  BMI 36.33 kg/m2  SpO2 100%  LMP 04/18/2013 Physical Exam  Constitutional: She is oriented to person, place, and time. She appears well-developed and well-nourished. No distress.  HENT:  Head: Normocephalic and atraumatic.  Mouth/Throat: Oropharynx is clear and moist. No oropharyngeal exudate.  Eyes: Conjunctivae and EOM are normal. Pupils are equal, round, and reactive to light.  Neck: Normal range of motion. Neck supple.  No meningismus  Cardiovascular: Normal rate, regular rhythm and normal heart sounds.   No murmur heard. Pulmonary/Chest: Effort normal and breath sounds normal. No  respiratory distress.  Abdominal: Soft. There is no tenderness. There is no rebound and no guarding.  Musculoskeletal: Normal range of motion. She exhibits no edema and no tenderness.  Neurological: She is alert and oriented to person, place, and time. She exhibits normal muscle tone. Coordination normal.  Subtle R sided facial droop with NLF flattening. Forehead spared. no ataxia on finger to nose, no nystagmus, 5/5 strength throughout, no pronator drift, Romberg negative, normal gait.   Skin: Skin is warm.    ED Course   Procedures (including critical care time)  Labs Reviewed  CBC WITH DIFFERENTIAL - Abnormal; Notable for the following:    RBC 5.39 (*)    MCV 73.1 (*)    MCHC 36.3 (*)    Monocytes Absolute 1.1 (*)    All other components within normal limits  COMPREHENSIVE METABOLIC PANEL - Abnormal; Notable for the following:    Sodium 132 (*)    All other components within normal limits  URINALYSIS, ROUTINE W REFLEX MICROSCOPIC - Abnormal; Notable for the following:    Ketones, ur 15 (*)    All other components within normal limits  PREGNANCY, URINE   Mr Brain Wo Contrast  05/08/2013   *RADIOLOGY REPORT*  Clinical Data: Right facial droop.  Tingling sensation in head.  MRI HEAD WITHOUT CONTRAST  Technique:  Multiplanar, multiecho pulse sequences of the brain and surrounding structures were obtained according to standard protocol without intravenous contrast.  Comparison: None.  Findings: The diffusion weighted images demonstrate no evidence for acute or subacute infarction.  No acute hemorrhage or mass lesion is present.  The ventricles are of normal size.  No significant extra-axial fluid collection is present.  No significant white matter disease is evident.  Flow is present in the major intracranial arteries.  Midline structures are within normal limits. The globes orbits are intact.  Circumferential mucosal thickening is present in the maxillary sinuses bilaterally.  The fluid  levels are present in both maxillary sinuses.  The anterior ethmoid air cells are opacified. Mucosal thickening is present in the hypoplastic frontal sinuses. Minimal mucosal thickening is present in the right sphenoid sinus and posterior ethmoid air cells.  The left sphenoid sinus is clear. The mastoid air cells are clear.  IMPRESSION:  1.  Extensive anterior paranasal sinusitis. 2.  Normal MRI appearance of the brain.   Original Report Authenticated By: Marin Roberts, M.D.   1. Paresthesias      MDM  Nonspecific "pricking" sensation and goosebump sensation diffusely in the head for the past 3 days. This is not overly concerning for primary neurological process however her exam reveals a subtle right-sided facial droop. She has been doing a liquid diet for some type of cleanse.  MRI is negative for stroke. Patient was able to show  me some pictures of herself where she has a similar asymmetric smile.  Electrolytes are normal. She does appear slightly anxious on exam. Her paresthesias suggest TIA or CVA. She saw neurology in May for similar symptoms and has followup this month. Patient has googled her symptoms and is worried about MS.  While I told her testing today does not rule out MS, there is nothing on the MRI brain to suggest it. Her symptoms may be metabolic or anxiety related but do not appear to be life threatening. She is stable for followup with neurology.  Glynn Octave, MD 05/08/13 [phone]

## 2013-05-10 ENCOUNTER — Telehealth: Payer: Self-pay | Admitting: Neurology

## 2013-05-11 ENCOUNTER — Ambulatory Visit: Payer: Federal, State, Local not specified - PPO | Admitting: Neurology

## 2013-05-11 NOTE — Telephone Encounter (Signed)
Appt made for 05-14-13 as of today.

## 2013-05-12 ENCOUNTER — Emergency Department (HOSPITAL_COMMUNITY)
Admission: EM | Admit: 2013-05-12 | Discharge: 2013-05-12 | Disposition: A | Discharge status: Home / Self Care | Payer: Federal, State, Local not specified - PPO | Source: Home / Self Care | Attending: Family Medicine | Admitting: Family Medicine

## 2013-05-12 ENCOUNTER — Telehealth: Payer: Self-pay | Admitting: *Deleted

## 2013-05-12 ENCOUNTER — Encounter (HOSPITAL_COMMUNITY): Payer: Self-pay | Admitting: Emergency Medicine

## 2013-05-12 DIAGNOSIS — F10239 Alcohol dependence with withdrawal, unspecified: Secondary | ICD-10-CM

## 2013-05-12 DIAGNOSIS — R251 Tremor, unspecified: Secondary | ICD-10-CM

## 2013-05-12 DIAGNOSIS — R259 Unspecified abnormal involuntary movements: Secondary | ICD-10-CM

## 2013-05-12 DIAGNOSIS — F10230 Alcohol dependence with withdrawal, uncomplicated: Secondary | ICD-10-CM

## 2013-05-12 LAB — POCT I-STAT, CHEM 8
BUN: 8 mg/dL (ref 6–23)
Chloride: 104 mEq/L (ref 96–112)
HCT: 42 % (ref 36.0–46.0)
Potassium: 3.6 mEq/L (ref 3.5–5.1)
Sodium: 139 mEq/L (ref 135–145)

## 2013-05-12 MED ORDER — LORAZEPAM 0.5 MG PO TABS: 0.5000 mg | ORAL_TABLET | Freq: Two times a day (BID) | ORAL | Status: DC | PRN

## 2013-05-12 NOTE — ED Notes (Signed)
Pt c/o waking up this am shaking and doesn't know why... Reports she was seen at [facility] ED on 8/2 for shaking and prickly sensation all over body... MRI was done; neg... Also seen by neurologist and MRI was also done; Neg MRI... Reports she has stopped drinking alcahol x2-3 weeks and has changed her diet since last Monday that consists of "smoothies" 3x per day w/snacks in between... Has appt w/neuro this Friday... Alert w/no signs of acute distress.

## 2013-05-12 NOTE — Telephone Encounter (Signed)
Pt recalling due to her sx that she is experiencing.  Shakiness, was unable to continue conversation due to pt seeing nurse at urgent care at this time.   She has an appt on Friday, with Dr. Frances Furbish.  She will call us back after appt.

## 2013-05-12 NOTE — ED Provider Notes (Addendum)
CSN: [id number]     Arrival date & time 05/12/13  0826 History     First MD Initiated Contact with Patient 05/12/13 0900     Chief Complaint  Patient presents with  . Shaking   (Consider location/radiation/quality/duration/timing/severity/associated sxs/prior Treatment) HPI Comments: 37 year old female with history of mood disorder an substance abuse here complaining of an episode of "body shaking" this morning and waking up. No loss of consciousness. Mostly hands involved. Lasted few minutes. Patient had similar symptoms associated with "prickly" sensation all over her body intermittently since May. She has seen a neurologist with whom she has an appointment in 2 days. Today symptoms have resolved now. She was seen 4 days ago at the emergency department for similar symptoms and had a normal MRI and her complete metabolic panel was only significant for a sodium level of 132. Patient states she has been trying to lose weight and is "drinking half her body weight in water daily". She does not have a primary care provider but she has an appointment at [facility] to practice next week to establish primary care. Patient also tells me that she used to drink alcohol at least 3 drinks daily and has not had alcoholic drinks for the last 2 and half weeks. States that in the past she has stopped drinking alcohol for up to 2 years and did not experience side effects when she stopped. states her neurologist is aware of her stopping alcohol drinking. Denies dizziness, headache or visual changes. No balance problems. No extremity numbness or weakness. She has had issues with anxiety and is not taking any medications other than vitamin D and sporadically multivitamins. She had B12 and TSH checked at her neurologist office with normal results as per her report.   Past Medical History  Diagnosis Date  . Yeast infection   . Bacterial infection   . Blood type, Rh negative   . Allergy   . Arthritis   . Depression   .  Substance abuse   . NWGNFAOZ(308.6)    Past Surgical History  Procedure Laterality Date  . Breast surgery     Family History  Problem Relation Age of Onset  . Hypertension Paternal Grandmother   . Diabetes Paternal Grandmother   . Heart disease Maternal Grandmother     9 bypass surgery  . Cancer Maternal Grandmother     mouth  . Sickle cell trait Father   . Seizures Father   . Thyroid disease Mother     as a child  . Hypertension Mother   . Anemia Mother   . Migraines Mother   . Hernia Mother   . Arthritis Maternal Aunt    History  Substance Use Topics  . Smoking status: Never Smoker   . Smokeless tobacco: Never Used  . Alcohol Use: Yes     Comment: Drinks 3x daily   OB History   Grav Para Term Preterm Abortions TAB SAB Ect Mult Living   9 0   8  1   0     Review of Systems  Constitutional: Negative for fever, chills, diaphoresis and fatigue.  Eyes: Negative for visual disturbance.  Gastrointestinal: Negative for nausea, vomiting and abdominal pain.  Endocrine: Negative for polydipsia, polyphagia and polyuria.  Skin: Negative for rash.  Neurological: Positive for tremors. Negative for dizziness, seizures, syncope, speech difficulty, weakness, numbness and headaches.  All other systems reviewed and are negative.    Allergies  Review of patient's allergies indicates no known allergies.  Home Medications   Current Outpatient Rx  Name  Route  Sig  Dispense  Refill  . Vitamin D, Ergocalciferol, (DRISDOL) 50000 UNITS CAPS   Oral   Take 50,000 Units by mouth every Saturday.         Marland Kitchen LORazepam (ATIVAN) 0.5 MG tablet   Oral   Take 1 tablet (0.5 mg total) by mouth 2 (two) times daily as needed for anxiety.   6 tablet   0   . Multiple Vitamin (MULTIVITAMIN WITH MINERALS) TABS   Oral   Take 1 tablet by mouth daily.          BP 115/82  Pulse 84  Temp(Src) 98.9 F (37.2 C) (Oral)  Resp 18  SpO2 100%  LMP 04/18/2013 Physical Exam  Nursing note and  vitals reviewed. Constitutional: She is oriented to person, place, and time. She appears well-developed and well-nourished. No distress.  HENT:  Mouth/Throat: Oropharynx is clear and moist.  Eyes: Conjunctivae are normal. No scleral icterus.  Neck: No thyromegaly present.  Cardiovascular: Normal heart sounds.   Pulmonary/Chest: Breath sounds normal. No respiratory distress. She has no wheezes. She has no rales. She exhibits no tenderness.  Abdominal: Soft. There is no tenderness.  Neurological: She is alert and oriented to person, place, and time. She has normal strength. She displays no tremor. No cranial nerve deficit or sensory deficit. She displays a negative Romberg sign. Coordination and gait normal.  No face drop. No arm drop.  Normal rapid alternating movements. Visual fields normal by comparison.  Skin: No rash noted. She is not diaphoretic.  Psychiatric: She has a normal mood and affect. Her behavior is normal. Judgment and thought content normal.    ED Course   Procedures (including critical care time)  Labs Reviewed  POCT I-STAT, CHEM 8 - Abnormal; Notable for the following:    Glucose, Bld 101 (*)    All other components within normal limits   No results found. 1. Occasional tremors   2. Withdrawal symptoms, alcohol, uncomplicated     MDM  Normal neurologic exam and vital signs. Sodium and other I-stat components normal.  Possible mild alcohol withdrawal symptoms, patient already followed by a neurologist with close follow up in 2 days. Prescribed ativan 0.5mg  bid prn #6 pills. Behavioral health referral. Counseled about withdrawal from alcohol. Asked to go tobehavioral health hospital if worsening or new symptoms despite following treatment.    Sharin Grave, MD 05/13/13 1610  Sharin Grave, MD 05/13/13 [id number]

## 2013-05-12 NOTE — Telephone Encounter (Signed)
Pt called me back and relayed that urgent care thinks more anxiety / ? ETOH withdrawal?  Pt has had 2 MRI brain, CT and normal.  Labs ok today.  (Na back to normal from 132).  Given 6 tabs of ativan by urgent care.  Told her to keep appt on Friday with Dr. Frances Furbish.  She was reassured.  Will back as needed.

## 2013-05-14 ENCOUNTER — Encounter: Payer: Self-pay | Admitting: Neurology

## 2013-05-14 ENCOUNTER — Ambulatory Visit (INDEPENDENT_AMBULATORY_CARE_PROVIDER_SITE_OTHER): Payer: Federal, State, Local not specified - PPO | Admitting: Neurology

## 2013-05-14 VITALS — BP 118/72 | HR 66 | Temp 98.4°F | Ht 62.0 in | Wt 186.0 lb

## 2013-05-14 DIAGNOSIS — F1010 Alcohol abuse, uncomplicated: Secondary | ICD-10-CM

## 2013-05-14 DIAGNOSIS — R209 Unspecified disturbances of skin sensation: Secondary | ICD-10-CM

## 2013-05-14 DIAGNOSIS — F411 Generalized anxiety disorder: Secondary | ICD-10-CM

## 2013-05-14 NOTE — Patient Instructions (Signed)
I think overall you are doing fairly well and are stable at this point.  Pls stop drinking alcohol.  As far as your medications are concerned, I would like to suggest: no new meds.     As far as diagnostic testing, I recommend: no new test. I do think you should address your anxiety with your new PCP. I do not think we need to make any changes in your medications at this point. I think you're stable enough that I can see you back if needed.  Call 911 for sudden blindness, weakness and numbness. For any emergencies you know to call 911 or go to the nearest emergency room.
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
## 2013-05-17 ENCOUNTER — Ambulatory Visit
Admission: RE | Admit: 2013-05-17 | Discharge: 2013-05-17 | Disposition: A | Discharge status: Home / Self Care | Payer: Federal, State, Local not specified - PPO | Source: Ambulatory Visit | Attending: Obstetrics and Gynecology | Admitting: Obstetrics and Gynecology

## 2013-05-17 DIAGNOSIS — N6311 Unspecified lump in the right breast, upper outer quadrant: Secondary | ICD-10-CM

## 2013-05-20 ENCOUNTER — Other Ambulatory Visit: Payer: Federal, State, Local not specified - PPO

## 2013-05-24 ENCOUNTER — Ambulatory Visit: Payer: Federal, State, Local not specified - PPO | Admitting: Neurology

## 2013-06-04 ENCOUNTER — Encounter (INDEPENDENT_AMBULATORY_CARE_PROVIDER_SITE_OTHER): Payer: Self-pay | Admitting: Surgery

## 2013-06-11 ENCOUNTER — Ambulatory Visit (INDEPENDENT_AMBULATORY_CARE_PROVIDER_SITE_OTHER): Payer: Federal, State, Local not specified - PPO | Admitting: Surgery

## 2013-06-21 ENCOUNTER — Encounter (INDEPENDENT_AMBULATORY_CARE_PROVIDER_SITE_OTHER): Payer: Self-pay | Admitting: Surgery

## 2013-06-21 ENCOUNTER — Ambulatory Visit (INDEPENDENT_AMBULATORY_CARE_PROVIDER_SITE_OTHER): Payer: Federal, State, Local not specified - PPO | Admitting: Surgery

## 2013-06-21 VITALS — BP 110/80 | HR 78 | Temp 98.2°F | Resp 14 | Ht 60.0 in | Wt 183.4 lb

## 2013-06-21 DIAGNOSIS — N6452 Nipple discharge: Secondary | ICD-10-CM | POA: Insufficient documentation

## 2013-06-21 DIAGNOSIS — N6459 Other signs and symptoms in breast: Secondary | ICD-10-CM

## 2013-06-21 NOTE — Progress Notes (Signed)
Patient ID: Sherri Buckley, female   DOB: 24-May-1976, 37 y.o.   MRN: [id number]  Chief Complaint  Patient presents with  . Other    breast pain/nipple discharge    HPI Sherri Buckley is a 37 y.o. female.   HPI This is a pleasant female for by Dr. Jaymes Graff for evaluation of right breast nipple discharge. She reports she thinks she is has a discharge from her breast over the past 2 months which is intermittent and clear. She really has a difficult time characterizing the drainage. It is definitely not bloody. She feels like her right breast is slightly larger and more lumpy. She's had a previous benign mass excised from the breast many years ago on the right side. She has consistently been getting mammograms and ultrasounds for last several years. Her most recent films which showed no abnormalities in August of this year. Her family history is negative for breast cancer Past Medical History  Diagnosis Date  . Yeast infection   . Bacterial infection   . Blood type, Rh negative   . Allergy   . Arthritis   . Depression   . Substance abuse   . [id number](478.2)     Past Surgical History  Procedure Laterality Date  . Breast surgery      Family History  Problem Relation Age of Onset  . Hypertension Paternal Grandmother   . Diabetes Paternal Grandmother   . Heart disease Maternal Grandmother     9 bypass surgery  . Cancer Maternal Grandmother     mouth  . Sickle cell trait Father   . Seizures Father   . Thyroid disease Mother     as a child  . Hypertension Mother   . Anemia Mother   . Migraines Mother   . Hernia Mother   . Arthritis Maternal Aunt     Social History History  Substance Use Topics  . Smoking status: Never Smoker   . Smokeless tobacco: Never Used  . Alcohol Use: Yes     Comment: Drinks 3x daily    No Known Allergies  Current Outpatient Prescriptions  Medication Sig Dispense Refill  . LORazepam (ATIVAN) 0.5 MG tablet Take 1 tablet (0.5 mg total)  by mouth 2 (two) times daily as needed for anxiety.  6 tablet  0  . Multiple Vitamin (MULTIVITAMIN WITH MINERALS) TABS Take 1 tablet by mouth daily.      . Vitamin D, Ergocalciferol, (DRISDOL) 50000 UNITS CAPS Take 50,000 Units by mouth every Saturday.       No current facility-administered medications for this visit.    Review of Systems Review of Systems  Constitutional: Negative for fever, chills and unexpected weight change.  HENT: Negative for hearing loss, congestion, sore throat, trouble swallowing and voice change.   Eyes: Negative for visual disturbance.  Respiratory: Negative for cough and wheezing.   Cardiovascular: Negative for chest pain, palpitations and leg swelling.  Gastrointestinal: Negative for nausea, vomiting, abdominal pain, diarrhea, constipation, blood in stool, abdominal distention and anal bleeding.  Genitourinary: Negative for hematuria, vaginal bleeding and difficulty urinating.  Musculoskeletal: Negative for arthralgias.  Skin: Negative for rash and wound.  Neurological: Negative for seizures, syncope and headaches.  Hematological: Negative for adenopathy. Does not bruise/bleed easily.  Psychiatric/Behavioral: Negative for confusion. The patient is nervous/anxious.     Blood pressure 110/80, pulse 78, temperature 98.2 F (36.8 C), temperature source Temporal, resp. rate 14, height 5' (1.524 m), weight 183 lb 6.4 oz (83.19 kg).  Physical Exam Physical Exam  Constitutional: She is oriented to person, place, and time. She appears well-developed and well-nourished. No distress.  HENT:  Head: Normocephalic and atraumatic.  Right Ear: External ear normal.  Left Ear: External ear normal.  Nose: Nose normal.  Eyes: Conjunctivae are normal. Pupils are equal, round, and reactive to light. Right eye exhibits no discharge. Left eye exhibits no discharge. No scleral icterus.  Neck: Normal range of motion. Neck supple. No tracheal deviation present.  Cardiovascular:  Normal rate, regular rhythm, normal heart sounds and intact distal pulses.   Pulmonary/Chest: Effort normal and breath sounds normal. No respiratory distress. She has no wheezes.  Abdominal: Soft. Bowel sounds are normal. She exhibits no distension.  Musculoskeletal: Normal range of motion. She exhibits no edema and no tenderness.  Lymphadenopathy:    She has no cervical adenopathy.    She has no axillary adenopathy.  Neurological: She is alert and oriented to person, place, and time.  Skin: Skin is warm and dry. No erythema.  Psychiatric: Her behavior is normal. Judgment normal.  Breast:  I cannot palpate a mass in the breast. I cannot elicit any discharge from the right nipple.  Data Reviewed I have reviewed her mammograms and ultrasound. There are no masses or dilated ducts  Assessment    Right nipple discharge     Plan    At this point, I believe this is benign and I am uncertain whether she is really having discharge. I will see her back in 3 months for reevaluation. She will try to better characterize the nipple discharge. If The discharge persists, she may need a ductogram        Ixel Boehning A 06/21/2013, 9:30 AM

## 2013-08-12 ENCOUNTER — Other Ambulatory Visit: Payer: Self-pay

## 2013-10-04 ENCOUNTER — Encounter (INDEPENDENT_AMBULATORY_CARE_PROVIDER_SITE_OTHER): Payer: Self-pay | Admitting: Surgery

## 2013-10-04 ENCOUNTER — Ambulatory Visit (INDEPENDENT_AMBULATORY_CARE_PROVIDER_SITE_OTHER): Payer: Federal, State, Local not specified - PPO | Admitting: Surgery

## 2013-10-04 VITALS — BP 126/82 | HR 84 | Temp 98.7°F | Resp 14 | Ht 60.0 in | Wt 189.0 lb

## 2013-10-04 DIAGNOSIS — N6452 Nipple discharge: Secondary | ICD-10-CM

## 2013-10-04 DIAGNOSIS — N6459 Other signs and symptoms in breast: Secondary | ICD-10-CM

## 2013-10-04 NOTE — Progress Notes (Signed)
Subjective:     Patient ID: Sherri Buckley, female   DOB: [birth year], 37 y.o.   MRN: [id number]  HPI She is here for a followup visit of her nipple discharge. She has had no nipple discharge since I saw her last approximately 3 months ago. She has no other problems regarding her breast.  Review of Systems     Objective:   Physical Exam On exam, there are no palpable breast masses in the areolar are normal    Assessment:     Resolved nipple discharge     Plan:     At this point, I doubt any kind of malignancy or mass. If the nipple discharge returned, I will order a ductogram. If not, she will continue her yearly mammograms and I will see her as needed

## 2013-12-17 ENCOUNTER — Emergency Department (HOSPITAL_COMMUNITY)
Admission: EM | Admit: 2013-12-17 | Discharge: 2013-12-17 | Disposition: A | Discharge status: Home / Self Care | Payer: Federal, State, Local not specified - PPO | Attending: Emergency Medicine | Admitting: Emergency Medicine

## 2013-12-17 ENCOUNTER — Encounter (HOSPITAL_COMMUNITY): Payer: Self-pay | Admitting: Emergency Medicine

## 2013-12-17 DIAGNOSIS — Z8659 Personal history of other mental and behavioral disorders: Secondary | ICD-10-CM | POA: Insufficient documentation

## 2013-12-17 DIAGNOSIS — R209 Unspecified disturbances of skin sensation: Secondary | ICD-10-CM | POA: Insufficient documentation

## 2013-12-17 DIAGNOSIS — Z8619 Personal history of other infectious and parasitic diseases: Secondary | ICD-10-CM | POA: Insufficient documentation

## 2013-12-17 DIAGNOSIS — F1020 Alcohol dependence, uncomplicated: Secondary | ICD-10-CM | POA: Insufficient documentation

## 2013-12-17 DIAGNOSIS — R202 Paresthesia of skin: Secondary | ICD-10-CM

## 2013-12-17 DIAGNOSIS — M129 Arthropathy, unspecified: Secondary | ICD-10-CM | POA: Insufficient documentation

## 2013-12-17 NOTE — ED Provider Notes (Signed)
Medical screening examination/treatment/procedure(s) were performed by non-physician practitioner and as supervising physician I was immediately available for consultation/collaboration.   EKG Interpretation None       Varney Biles, MD 12/17/13 0930

## 2013-12-17 NOTE — ED Provider Notes (Signed)
CSN: [id number]     Arrival date & time 12/17/13  0515 History   First MD Initiated Contact with Patient 12/17/13 [phone]     Chief Complaint  Patient presents with  . Back Pain    (Consider location/radiation/quality/duration/timing/severity/associated sxs/prior Treatment) HPI Comments: Patient presents with complaint of tingling and a gripping sensation in the middle of her back underneath her bra strap. This has been going on for 6 days. It is worse with activity and standing and better with lying flat and sleeping. Patient saw her physician for this 3 days ago and she had vitamin D, vitamin B and basic labs checked which were normal. She is concerned that she has a blood clots. Patient has history of paresthesias for which she was worked up with head CT and MRI 1-2 years ago. This was unrevealing. Patient admits to alcoholism. No red flag signs and symptoms of back pain. The onset of this condition was acute. The course is constant. Aggravating factors: none. Alleviating factors: none.    The history is provided by the patient.    Past Medical History  Diagnosis Date  . Yeast infection   . Bacterial infection   . Blood type, Rh negative   . Allergy   . Arthritis   . Depression   . Substance abuse   . TFTDDUKG(254.2)    Past Surgical History  Procedure Laterality Date  . Breast surgery     Family History  Problem Relation Age of Onset  . Hypertension Paternal Grandmother   . Diabetes Paternal Grandmother   . Heart disease Maternal Grandmother     9 bypass surgery  . Cancer Maternal Grandmother     mouth  . Sickle cell trait Father   . Seizures Father   . Thyroid disease Mother     as a child  . Hypertension Mother   . Anemia Mother   . Migraines Mother   . Hernia Mother   . Arthritis Maternal Aunt    History  Substance Use Topics  . Smoking status: Never Smoker   . Smokeless tobacco: Never Used  . Alcohol Use: Yes     Comment: Drinks 3x daily   OB History   Grav Para Term Preterm Abortions TAB SAB Ect Mult Living   9 0   8  1   0     Review of Systems  Constitutional: Negative for fever.  HENT: Negative for rhinorrhea and sore throat.   Eyes: Negative for redness.  Respiratory: Negative for cough.   Cardiovascular: Negative for chest pain.  Gastrointestinal: Negative for nausea, vomiting, abdominal pain and diarrhea.  Genitourinary: Negative for dysuria.  Musculoskeletal: Negative for myalgias.  Skin: Negative for rash.  Neurological: Positive for numbness (paresthesia). Negative for headaches.    Allergies  Review of patient's allergies indicates no known allergies.  Home Medications   Current Outpatient Rx  Name  Route  Sig  Dispense  Refill  . Multiple Vitamin (MULTIVITAMIN WITH MINERALS) TABS   Oral   Take 1 tablet by mouth daily.         . Vitamin D, Ergocalciferol, (DRISDOL) 50000 UNITS CAPS   Oral   Take 50,000 Units by mouth every Saturday.          BP 115/86  Pulse 87  Temp(Src) 97.9 F (36.6 C) (Oral)  Resp 18  SpO2 100%  LMP 11/29/2013  Physical Exam  Nursing note and vitals reviewed. Constitutional: She is oriented to person, place, and time. She  appears well-developed and well-nourished.  HENT:  Head: Normocephalic and atraumatic.  Right Ear: Tympanic membrane, external ear and ear canal normal.  Left Ear: Tympanic membrane, external ear and ear canal normal.  Nose: Nose normal.  Mouth/Throat: Uvula is midline, oropharynx is clear and moist and mucous membranes are normal.  Eyes: Conjunctivae, EOM and lids are normal. Pupils are equal, round, and reactive to light. Right eye exhibits no nystagmus. Left eye exhibits no nystagmus.  Neck: Normal range of motion. Neck supple.  Cardiovascular: Normal rate and regular rhythm.   Pulmonary/Chest: Effort normal and breath sounds normal.  Abdominal: Soft. There is no tenderness. There is no CVA tenderness.  Musculoskeletal: Normal range of motion. She  exhibits no edema and no tenderness.       Cervical back: She exhibits normal range of motion, no tenderness and no bony tenderness.  No step-off noted with palpation of spine.   Neurological: She is alert and oriented to person, place, and time. She has normal strength and normal reflexes. No cranial nerve deficit or sensory deficit. She displays a negative Romberg sign. Coordination and gait normal. GCS eye subscore is 4. GCS verbal subscore is 5. GCS motor subscore is 6.  5/5 strength in entire lower extremities bilaterally. No sensation deficit.   Skin: Skin is warm and dry. No rash noted.  Psychiatric: She has a normal mood and affect.   ED Course  Procedures (including critical care time) Labs Review Labs Reviewed - No data to display Imaging Review No results found.   EKG Interpretation None      7:19 AM Patient seen and examined.   Vital signs reviewed and are as follows: Filed Vitals:   12/17/13 0519  BP: 115/86  Pulse: 87  Temp: 97.9 F (36.6 C)  Resp: 18   We discussed at length that there are no concerns for blood clot, stroke, spinal cord injury at this current time. She is reassured.  I encouraged patient to followup with her primary care physician for further workup and evaluation of her symptoms.  Patient counseled to return if they have weakness in their arms or legs, slurred speech, trouble walking or talking, confusion, trouble with their balance, or if they have any other concerns. Patient verbalizes understanding and agrees with plan.    MDM   Final diagnoses:  Paresthesia   Patient with paresthesias without any objective findings on her neuro exam. No concern for cauda equina, spinal cord infarction or infection. No concern for stroke at this time. Not able to definitively rule out MS. Patient has had venous evaluation of her PCP. She is to follow up with PCP and return with worsening.  No dangerous or life-threatening conditions suspected or  identified by history, physical exam, and by work-up. No indications for hospitalization identified.     Carlisle Cater, PA-C 12/17/13 [phone]

## 2013-12-17 NOTE — Discharge Instructions (Signed)
Please read and follow all provided instructions.  Your diagnoses today include:  1. Paresthesia     Tests performed today include:  Vital signs. See below for your results today.   Medications prescribed:   None  Home care instructions:  Follow any educational materials contained in this packet.  Follow-up instructions: Please follow-up with your primary care provider as needed for further evaluation of your symptoms.  If you do not have a primary care doctor -- see below for referral information.   Return instructions:   Please return to the Emergency Department if you experience worsening symptoms.   Please return if you have any other emergent concerns.  Additional Information:  Your vital signs today were: BP 115/86   Pulse 87   Temp(Src) 97.9 F (36.6 C) (Oral)   Resp 18   SpO2 100%   LMP 11/29/2013 If your blood pressure (BP) was elevated above 135/85 this visit, please have this repeated by your doctor within one month. ---------------

## 2013-12-17 NOTE — ED Notes (Signed)
Pt reports thoracic "pulling and tingling" up into her neck since last Saturday. Denies any known injuries or strenuous activities. States that she has had x2 MRI and x1 CT of her brain for this previous sensation in other parts of her body. States she was seen at her PCP for the same on Tuesday but states she did not feel it was necessary at that time, but was told to follow up if the symptoms increased. Pt states that she also has tremors and they are believed to be d/t her alcohol dependence. Pt a&o x4, ambulatory to triage.

## 2013-12-20 ENCOUNTER — Other Ambulatory Visit: Payer: Self-pay | Admitting: Family Medicine

## 2013-12-20 ENCOUNTER — Ambulatory Visit
Admission: RE | Admit: 2013-12-20 | Discharge: 2013-12-20 | Disposition: A | Discharge status: Home / Self Care | Payer: Federal, State, Local not specified - PPO | Source: Ambulatory Visit | Attending: Family Medicine | Admitting: Family Medicine

## 2013-12-20 DIAGNOSIS — M549 Dorsalgia, unspecified: Secondary | ICD-10-CM

## 2014-01-12 ENCOUNTER — Other Ambulatory Visit: Payer: Self-pay

## 2014-01-12 DIAGNOSIS — Z1231 Encounter for screening mammogram for malignant neoplasm of breast: Secondary | ICD-10-CM

## 2014-02-09 ENCOUNTER — Telehealth: Payer: Self-pay | Admitting: Emergency Medicine

## 2014-02-09 ENCOUNTER — Ambulatory Visit (INDEPENDENT_AMBULATORY_CARE_PROVIDER_SITE_OTHER): Payer: Federal, State, Local not specified - PPO | Admitting: Emergency Medicine

## 2014-02-09 VITALS — BP 120/96 | HR 92 | Temp 98.6°F | Resp 16 | Ht 60.0 in | Wt 189.0 lb

## 2014-02-09 DIAGNOSIS — J309 Allergic rhinitis, unspecified: Secondary | ICD-10-CM

## 2014-02-09 DIAGNOSIS — J209 Acute bronchitis, unspecified: Secondary | ICD-10-CM

## 2014-02-09 MED ORDER — ALBUTEROL SULFATE HFA 108 (90 BASE) MCG/ACT IN AERS: 2.0000 | INHALATION_SPRAY | RESPIRATORY_TRACT | Status: DC | PRN

## 2014-02-09 NOTE — Patient Instructions (Signed)

## 2014-02-09 NOTE — Progress Notes (Signed)
[facility] [address], [place] [address] [phone]  Date:  02/09/2014   Name:  Sherri Buckley   DOB:  [birth year]   MRN:  [id number]  PCP:  Reginia Naas, MD    Chief Complaint: Cough and Wheezing   History of Present Illness:  Sherri Buckley is a 38 y.o. very pleasant female patient who presents with the following:  Ill for two days with a cough. No nasal drainage or congestion.  Has post nasal drainage but doesn't see it.  Now has a non productive cough with some wheezing particularly at night.  No shortness of breath.  No fever or chills.  No nausea or vomiting.  No stool change or rash.  Has trouble with allergies.  No improvement with over the counter medications or other home remedies. Denies other complaint or health concern today.   Patient Active Problem List   Diagnosis Date Noted  . Nipple discharge 06/21/2013  . Hypovitaminosis D 02/09/2013  . Alcohol abuse 02/09/2013    Past Medical History  Diagnosis Date  . Yeast infection   . Bacterial infection   . Blood type, Rh negative   . Allergy   . Arthritis   . Depression   . Substance abuse   . AOZHYQMV(784.6)     Past Surgical History  Procedure Laterality Date  . Breast surgery      History  Substance Use Topics  . Smoking status: Never Smoker   . Smokeless tobacco: Never Used  . Alcohol Use: Yes     Comment: Drinks 3x daily    Family History  Problem Relation Age of Onset  . Hypertension Paternal Grandmother   . Diabetes Paternal Grandmother   . Heart disease Maternal Grandmother     9 bypass surgery  . Cancer Maternal Grandmother     mouth  . Sickle cell trait Father   . Seizures Father   . Thyroid disease Mother     as a child  . Hypertension Mother   . Anemia Mother   . Migraines Mother   . Hernia Mother   . Arthritis Maternal Aunt     No Known Allergies  Medication list has been reviewed and updated.  Current Outpatient Prescriptions on  File Prior to Visit  Medication Sig Dispense Refill  . Multiple Vitamin (MULTIVITAMIN WITH MINERALS) TABS Take 1 tablet by mouth daily.      . Vitamin D, Ergocalciferol, (DRISDOL) 50000 UNITS CAPS Take 50,000 Units by mouth every Saturday.       No current facility-administered medications on file prior to visit.    Review of Systems:  As per HPI, otherwise negative.    Physical Examination: Filed Vitals:   02/09/14 0832  BP: 120/96  Pulse: 92  Temp: 98.6 F (37 C)  Resp: 16   Filed Vitals:   02/09/14 0832  Height: 5' (1.524 m)  Weight: 189 lb (85.73 kg)   Body mass index is 36.91 kg/(m^2). Ideal Body Weight: Weight in (lb) to have BMI = 25: 127.7  GEN: WDWN, NAD, Non-toxic, A & O x 3 HEENT: Atraumatic, Normocephalic. Neck supple. No masses, No LAD. Ears and Nose: No external deformity. CV: RRR, No M/G/R. No JVD. No thrill. No extra heart sounds. PULM: CTA B, scattered wheezes, no crackles, rhonchi. No retractions. No resp. distress. No accessory muscle use. ABD: S, NT, ND, +BS. No rebound. No HSM. EXTR: No c/c/e NEURO Normal gait.  PSYCH: Normally interactive. Conversant.  Not depressed or anxious appearing.  Calm demeanor.    Assessment and Plan: Bronchitis Albuterol Change to claritin d  Signed,  Ellison Carwin, MD

## 2014-02-09 NOTE — Telephone Encounter (Signed)
Patient would like her albuterol script sent to [facility] on [address].   [phone]

## 2014-02-09 NOTE — Telephone Encounter (Signed)
rx sent to [facility]

## 2014-02-09 NOTE — Addendum Note (Signed)
Addended by: Kem Boroughs D on: 02/09/2014 10:03 AM   Modules accepted: Orders

## 2014-02-11 ENCOUNTER — Telehealth: Payer: Self-pay

## 2014-02-11 NOTE — Telephone Encounter (Signed)
Pt saw Dr.Anderson for coughing and wheezing, and said she was told to call if what she was prescribed did not work, and he would call in something else. She also mentioned that her symptoms have worsened and is now coughing up  [place] flim. Please [phone]

## 2014-02-11 NOTE — Telephone Encounter (Signed)
Dr. Anderson please advise

## 2014-02-17 ENCOUNTER — Telehealth: Payer: Self-pay

## 2014-02-17 ENCOUNTER — Other Ambulatory Visit: Payer: Self-pay | Admitting: Emergency Medicine

## 2014-02-17 NOTE — Telephone Encounter (Signed)
Patient stated she called on Feb 11, 2014 complaining of coughing and wheezing and no one followed up with her.She is coughing up clear and brownish fluid.

## 2014-02-18 ENCOUNTER — Other Ambulatory Visit: Payer: Self-pay | Admitting: Emergency Medicine

## 2014-02-18 MED ORDER — AZITHROMYCIN 250 MG PO TABS: ORAL_TABLET | ORAL | Status: DC

## 2014-02-18 NOTE — Telephone Encounter (Signed)
She should return if she has fever, or if her cough is getting worse, and wheezing. She can try Mucinex, if she is improving. Called patient, she indicates Dr Ouida Sills spoke to her yesterday. She is advised to return if she worsens.

## 2014-03-06 ENCOUNTER — Ambulatory Visit (INDEPENDENT_AMBULATORY_CARE_PROVIDER_SITE_OTHER): Payer: Federal, State, Local not specified - PPO | Admitting: Family Medicine

## 2014-03-06 ENCOUNTER — Ambulatory Visit: Payer: Federal, State, Local not specified - PPO

## 2014-03-06 VITALS — BP 112/70 | HR 80 | Temp 98.3°F | Resp 16 | Ht 61.0 in | Wt 183.4 lb

## 2014-03-06 DIAGNOSIS — R059 Cough, unspecified: Secondary | ICD-10-CM

## 2014-03-06 DIAGNOSIS — R0602 Shortness of breath: Secondary | ICD-10-CM

## 2014-03-06 DIAGNOSIS — J309 Allergic rhinitis, unspecified: Secondary | ICD-10-CM

## 2014-03-06 DIAGNOSIS — R209 Unspecified disturbances of skin sensation: Secondary | ICD-10-CM

## 2014-03-06 DIAGNOSIS — R202 Paresthesia of skin: Secondary | ICD-10-CM

## 2014-03-06 DIAGNOSIS — R05 Cough: Secondary | ICD-10-CM

## 2014-03-06 DIAGNOSIS — J9801 Acute bronchospasm: Secondary | ICD-10-CM

## 2014-03-06 MED ORDER — PREDNISONE 20 MG PO TABS: ORAL_TABLET | ORAL | Status: DC

## 2014-03-06 MED ORDER — FLUTICASONE PROPIONATE 50 MCG/ACT NA SUSP: 2.0000 | Freq: Every day | NASAL | Status: DC

## 2014-03-06 MED ORDER — ALBUTEROL SULFATE (2.5 MG/3ML) 0.083% IN NEBU: 2.5000 mg | INHALATION_SOLUTION | Freq: Once | RESPIRATORY_TRACT | Status: DC

## 2014-03-06 NOTE — Progress Notes (Signed)
Subjective:    Patient ID: Sherri Buckley, female    DOB: 25-Nov-1975, 38 y.o.   MRN: [id number]  03/06/2014  Cough and Wheezing   Cough Associated symptoms include wheezing. Pertinent negatives include no chest pain, chills, fever or shortness of breath.  Wheezing  Associated symptoms include coughing. Pertinent negatives include no chest pain, chills, fever, neck pain or shortness of breath.   This 38 y.o. female presents for evaluation of persistent cough.   Onset three weeks ago.  No fever/chill/sweats.  Mild intermittent headache.  L ear pain one day.  No sore throat.  No rhinorrhea; no nasal congestion; no PND.+coughing and wheezing.  Intermittent sputum clear to brown.  No SOB.  No v/d.  Known allergic rhinitis.  Some eye itching; some sneezing.  Coughing has improved; using Albuterol sparingly; using Robitussin DM without improvement.  Works in Pensions consultant at [employer]. No tobacco; smokes marijuana twice yearly.  No history of asthma.  Evaluated two weeks ago; diagnosed with bronchitis; prescribed Albuterol and switch to Claritin.  Two days later, prescribed Zpack without improvement.   Has multiple other concerns today.  Complaining of paresthesias along spine and other parts of body.  Also complaining of loud stomach and poking sensation in lower abdomen.  No n/v/d/c.  Tingling sensation intermittent and comes and goes but occurs daily in upper back midline.  No radiation into arms or legs; no focal weakness; s/p spine films by PCP; also labs checked including TSH, vitamin B12 normal.  Worried about etiology.  No medications prescribed.  S/p MRI brain in past year due to paresthesias and normal other than sinus congestion/fluid.   Review of Systems  Constitutional: Negative for fever, chills, diaphoresis and fatigue.  Respiratory: Positive for cough and wheezing. Negative for shortness of breath and stridor.   Cardiovascular: Negative for chest pain and leg swelling.    Musculoskeletal: Negative for back pain, neck pain and neck stiffness.  Neurological: Positive for numbness. Negative for weakness.    Past Medical History  Diagnosis Date  . Yeast infection   . Bacterial infection   . Blood type, Rh negative   . Allergy   . Arthritis   . Depression   . Substance abuse   . XLKGMWNU(272.5)    Past Surgical History  Procedure Laterality Date  . Breast surgery      No Known Allergies Current Outpatient Prescriptions  Medication Sig Dispense Refill  . albuterol (PROVENTIL HFA;VENTOLIN HFA) 108 (90 BASE) MCG/ACT inhaler Inhale 2 puffs into the lungs every 4 (four) hours as needed for wheezing or shortness of breath (cough, shortness of breath or wheezing.).  1 Inhaler  1  . Multiple Vitamin (MULTIVITAMIN WITH MINERALS) TABS Take 1 tablet by mouth daily.      . Vitamin D, Ergocalciferol, (DRISDOL) 50000 UNITS CAPS Take 50,000 Units by mouth every Saturday.      . fluticasone (FLONASE) 50 MCG/ACT nasal spray Place 2 sprays into both nostrils daily.  16 g  6  . predniSONE (DELTASONE) 20 MG tablet Three tablets daily x 1 day then two tablets daily x 5 days then one tablet daily x 5 days  18 tablet  0   Current Facility-Administered Medications  Medication Dose Route Frequency Provider Last Rate Last Dose  . albuterol (PROVENTIL) (2.5 MG/3ML) 0.083% nebulizer solution 2.5 mg  2.5 mg Nebulization Once Sherri Honour, MD       History   Social History  . Marital Status: Single  Spouse Name: N/A    Number of Children: N/A  . Years of Education: N/A   Occupational History  .  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 Smoking status: Never Smoker   . Smokeless tobacco: Never Used  . Alcohol Use: Yes     Comment: Drinks 3x daily  . Drug Use: Yes    Special: Marijuana  . Sexual Activity: Not Currently   Other Topics Concern  . Not on file   Social History Narrative  . No narrative on file         Objective:    BP 112/70  Pulse 80  Temp(Src) 98.3 F (36.8 C) (Oral)  Resp 16  Ht 5\' 1"  (1.549 m)  Wt 183 lb 6.4 oz (83.19 kg)  BMI 34.67 kg/m2  SpO2 97%  LMP 02/15/2014 Physical Exam  Nursing note and vitals reviewed. Constitutional: She is oriented to person, place, and time. She appears well-developed and well-nourished. No distress.  HENT:  Head: Normocephalic and atraumatic.  Right Ear: External ear normal.  Left Ear: External ear normal.  Nose: Nose normal.  Mouth/Throat: Oropharynx is clear and moist. No oropharyngeal exudate.  Eyes: Conjunctivae and EOM are normal. Pupils are equal, round, and reactive to light.  Neck: Normal range of motion. Neck supple.  Cardiovascular: Normal rate, regular rhythm and normal heart sounds.  Exam reveals no gallop and no friction rub.   No murmur heard. Pulmonary/Chest: Effort normal. She has wheezes. She has no rales.  Diffuse inspiratory and expiratory wheezing throughout; moderate air movement; no tachypnea; no accessory muscle use.  Speaking complete sentences.  Abdominal: Soft. Bowel sounds are normal. She exhibits no distension. There is no tenderness. There is no rebound and no guarding.  Musculoskeletal:       Cervical back: She exhibits normal range of motion, no tenderness, no pain and no spasm.       Thoracic back: She exhibits normal range of motion and no tenderness.  Lymphadenopathy:    She has no cervical adenopathy.  Neurological: She is alert and oriented to person, place, and time. No cranial nerve deficit. She exhibits normal muscle tone. Coordination normal.  Skin: Skin is warm and dry. No rash noted. She is not diaphoretic. No erythema. No pallor.  Psychiatric: She has a normal mood and affect. Her behavior is normal.   ALBUTEROL NEBULIZER ADMINISTERED IN OFFICE.  [facility] reading (PRIMARY) by  Sherri Buckley.  CXR:  NAD       Assessment & Plan:  Shortness of breath - Plan: DG Chest 2 View, albuterol (PROVENTIL) (2.5  MG/3ML) 0.083% nebulizer solution 2.5 mg  Bronchospasm  Allergic rhinitis  Paresthesias  Cough  1 .Bronchospasm:  New.  S/p Albuterol nebulizer in the office.  Rx Prednisone provided; also recommend increasing Albuterol to tid scheduled for five days and then PRN. Call in two weeks if no improvement. Triggered from allergic rhinitis. 2. Allergic Rhinitis: uncontrolled; switch to Claritin; restart Flonase; rx provided. 3.  Paresthesias:  New.  S/p labs and xrays spine by PCP and reported normal; obtain records; Prednisone taper may help any inflammation from thoracic/cervical spines.  No associated weakness or pain. Benign exam.  Normal neurological exam.  Meds ordered this encounter  Medications  . albuterol (PROVENTIL) (2.5 MG/3ML) 0.083% nebulizer solution 2.5 mg    Sig:   . predniSONE (DELTASONE) 20 MG tablet    Sig: Three tablets daily x 1 day then  two tablets daily x 5 days then one tablet daily x 5 days    Dispense:  18 tablet    Refill:  0  . fluticasone (FLONASE) 50 MCG/ACT nasal spray    Sig: Place 2 sprays into both nostrils daily.    Dispense:  16 g    Refill:  6    No Follow-up on file.   Reginia Forts, M.D.  [facility] [address] [place], [place]  [address] [phone] phone [phone] fax

## 2014-03-06 NOTE — Patient Instructions (Signed)
1.  Increase Albuterol inhaler to 2 puffs three times daily for the next 5 days and then use as needed. 2. Take Prednisone as prescribed. 3.  Switch Claritin D to Claritin and take one tablet daily for the next month.   4. Start Flonase 2 sprays into each nostril daily for the next month. 5.  You can stop Claritin and Flonase on July 1; recommend restarting both Claritin and Flonase on March 1 each year.  6. The prednisone taper should help the tingling in your back.

## 2014-03-07 ENCOUNTER — Other Ambulatory Visit: Payer: Self-pay | Admitting: Emergency Medicine

## 2014-04-06 ENCOUNTER — Other Ambulatory Visit: Payer: Self-pay | Admitting: Emergency Medicine

## 2014-04-06 NOTE — Telephone Encounter (Signed)
Pt is needing a refill on Vitamin D, Ergocalciferol, (DRISDOL) 50000 UNITS CAPS capsule [[id number]], says she previously had a prescription for a year and would like to know if she can have that agin instead of having to renew every month, please advice a pt

## 2014-04-07 ENCOUNTER — Other Ambulatory Visit: Payer: Self-pay | Admitting: Emergency Medicine

## 2014-05-23 ENCOUNTER — Ambulatory Visit
Admission: RE | Admit: 2014-05-23 | Discharge: 2014-05-23 | Disposition: A | Discharge status: Home / Self Care | Payer: Federal, State, Local not specified - PPO | Source: Ambulatory Visit

## 2014-05-23 ENCOUNTER — Ambulatory Visit: Payer: Federal, State, Local not specified - PPO

## 2014-05-23 DIAGNOSIS — Z1231 Encounter for screening mammogram for malignant neoplasm of breast: Secondary | ICD-10-CM

## 2014-09-15 ENCOUNTER — Telehealth: Payer: Self-pay

## 2014-09-15 MED ORDER — VITAMIN D (ERGOCALCIFEROL) 1.25 MG (50000 UNIT) PO CAPS: 50000.0000 [IU] | ORAL_CAPSULE | ORAL | Status: DC

## 2014-09-15 NOTE — Telephone Encounter (Signed)
Pt LMOM that she needs RF of Vit D, stating pharm has been sending reqs. We have had no reqs since July in [place]. We have been putting notices on last couple of RFs that she needs f/up, and I don't see any discussion of vit D since 02/2013. We haven't done any vit D labs. I called pt and LMOM explaining this and advised I will send in a 2 week RF and she needs to come in for f/up/labs.

## 2014-10-13 ENCOUNTER — Ambulatory Visit (INDEPENDENT_AMBULATORY_CARE_PROVIDER_SITE_OTHER): Payer: Federal, State, Local not specified - PPO | Admitting: Family Medicine

## 2014-10-13 ENCOUNTER — Ambulatory Visit (INDEPENDENT_AMBULATORY_CARE_PROVIDER_SITE_OTHER): Payer: Federal, State, Local not specified - PPO

## 2014-10-13 VITALS — BP 128/82 | HR 101 | Temp 98.3°F | Resp 18 | Ht 61.0 in | Wt 197.6 lb

## 2014-10-13 DIAGNOSIS — J4521 Mild intermittent asthma with (acute) exacerbation: Secondary | ICD-10-CM

## 2014-10-13 DIAGNOSIS — J9801 Acute bronchospasm: Secondary | ICD-10-CM

## 2014-10-13 MED ORDER — ALBUTEROL SULFATE HFA 108 (90 BASE) MCG/ACT IN AERS: 2.0000 | INHALATION_SPRAY | Freq: Four times a day (QID) | RESPIRATORY_TRACT | Status: DC | PRN

## 2014-10-13 MED ORDER — PREDNISONE 20 MG PO TABS: ORAL_TABLET | ORAL | Status: DC

## 2014-10-13 MED ORDER — BECLOMETHASONE DIPROPIONATE 40 MCG/ACT IN AERS: 2.0000 | INHALATION_SPRAY | Freq: Two times a day (BID) | RESPIRATORY_TRACT | Status: DC

## 2014-10-13 MED ORDER — ALBUTEROL SULFATE (2.5 MG/3ML) 0.083% IN NEBU
2.5000 mg | INHALATION_SOLUTION | Freq: Once | RESPIRATORY_TRACT | Status: AC
  Administered 2014-10-13: 2.5 mg via RESPIRATORY_TRACT

## 2014-10-13 NOTE — Progress Notes (Addendum)
Patient ID: Sherri Buckley, female   DOB: Aug 19, 1976, 39 y.o.   MRN: [id number]   Subjective:  This chart was scribed for Reginia Forts, MD by Randa Evens, ED Scribe. This Patient was seen in room 10 and the patients care was started at 4:55 PM   Patient ID: Sherri Buckley, female    DOB: June 22, 1976, 39 y.o.   MRN: [id number]  10/13/2014  Wheezing and Cough   HPI HPI Comments: Sherri Buckley is a 39 y.o. female who [facility] complaining of recurrent wheezing and cough onset 1 month prior. Pt states she has associated post nasal drip. Pt states that her symptoms present yearly when the weather gets colder.  Pt was here in May 2015 for same symptoms. Pt states she would like to receive breathing treatment and chest x-ray. Pt states she has a Hx of chronic sinus congestion; she is currently not taking Flonase or oral antihistamine as prescribed at visit in 02/2014. Pt states she works at [employer] center and there is a lot of dust in the environment. Pt states she used her inhaler last night for the first time since onset of symptoms one month ago but it didn't provide any relief. Denies fever, chills, sweats, HA, ear pain, sore throat, congestion, rhinorrhea, vomiting, or diarrhea. Denies malaise, myalgias, fatigue.  Energy level is good. Does not feel acutely ill.  Review of Systems  Constitutional: Negative for fever, chills, diaphoresis and fatigue.  HENT: Positive for congestion and postnasal drip. Negative for ear pain, rhinorrhea, sinus pressure and sore throat.   Respiratory: Positive for cough and wheezing. Negative for shortness of breath and stridor.   Cardiovascular: Negative for chest pain, palpitations and leg swelling.  Gastrointestinal: Negative for nausea, vomiting, abdominal pain and diarrhea.  Skin: Negative for rash.  Neurological: Negative for headaches.    Past Medical History  Diagnosis Date  . Yeast infection   .  Bacterial infection   . Blood type, Rh negative   . Allergy   . Arthritis   . Depression   . Substance abuse   . JOITGPQD(826.4)    Past Surgical History  Procedure Laterality Date  . Breast surgery     No Known Allergies      Objective:    BP 128/82 mmHg  Pulse 101  Temp(Src) 98.3 F (36.8 C) (Oral)  Resp 18  Ht 5\' 1"  (1.549 m)  Wt 197 lb 9.6 oz (89.631 kg)  BMI 37.36 kg/m2  SpO2 100%  LMP 10/06/2014   Physical Exam  Constitutional: She is oriented to person, place, and time. She appears well-developed and well-nourished. No distress.  HENT:  Head: Normocephalic and atraumatic.  Right Ear: External ear normal.  Left Ear: External ear normal.  Nose: Mucosal edema and rhinorrhea present. Right sinus exhibits no maxillary sinus tenderness and no frontal sinus tenderness. Left sinus exhibits no maxillary sinus tenderness and no frontal sinus tenderness.  Mouth/Throat: Oropharynx is clear and moist.  Congestion in left nare.   Eyes: Conjunctivae and EOM are normal. Pupils are equal, round, and reactive to light.  Neck: Normal range of motion. Neck supple. No tracheal deviation present.  Cardiovascular: Normal rate, regular rhythm, normal heart sounds and intact distal pulses.  Exam reveals no gallop and no friction rub.   No murmur heard. Pulmonary/Chest: Effort normal. No respiratory distress. She has wheezes. She has no rales.  Diffuse wheezing throughout.   Musculoskeletal: Normal range of motion.  She exhibits no edema.  No leg edema noted.   Lymphadenopathy:    She has no cervical adenopathy.  Neurological: She is alert and oriented to person, place, and time.  Skin: Skin is warm and dry. No rash noted. She is not diaphoretic.  Psychiatric: She has a normal mood and affect. Her behavior is normal.  Nursing note and vitals reviewed.  [facility] reading (PRIMARY) by  Dr. Tamala Julian.  CXR:  NAD  SPIROMETRY: Restricted pattern; FVC 57%  ALBUTEROL NEBULIZER ADMINISTERED IN  OFFICE.     Assessment & Plan:   1. Bronchospasm   2. Asthma with acute exacerbation, mild intermittent     1. Acute Bronchospasm: New/recurrent.  S/p albuterol nebulizer in office with near resolution of wheezing on exam.  Rx for PRednisone taper provided; recommend increasing Albuterol HFA to qid scheduled for one week and then PRN.  RTC for acute worsening. 2.  Asthma mild intermittent with acute exacerbation: New diagnosis for patient.  Has suffered with recurrent wheezing with cold weather exposure for past three years.  Rx for QVAR provided to use daily during winter months; continue Albuterol PRN.  Follow-up in 3 months.  Pt declined flu vaccine.    Meds ordered this encounter  Medications  . albuterol (PROVENTIL) (2.5 MG/3ML) 0.083% nebulizer solution 2.5 mg    Sig:   . DISCONTD: predniSONE (DELTASONE) 20 MG tablet    Sig: Three tablets daily x 2 days, then two tablets daily x 5 days, then one tablet daily x 5 days    Dispense:  21 tablet    Refill:  0  . DISCONTD: albuterol (PROVENTIL HFA;VENTOLIN HFA) 108 (90 BASE) MCG/ACT inhaler    Sig: Inhale 2 puffs into the lungs every 6 (six) hours as needed for wheezing or shortness of breath (cough, shortness of breath or wheezing.).    Dispense:  1 Inhaler    Refill:  1  . beclomethasone (QVAR) 40 MCG/ACT inhaler    Sig: Inhale 2 puffs into the lungs 2 (two) times daily.    Dispense:  1 Inhaler    Refill:  12  . predniSONE (DELTASONE) 20 MG tablet    Sig: Three tablets daily x 2 days, then two tablets daily x 5 days, then one tablet daily x 5 days    Dispense:  21 tablet    Refill:  0  . albuterol (PROVENTIL HFA;VENTOLIN HFA) 108 (90 BASE) MCG/ACT inhaler    Sig: Inhale 2 puffs into the lungs every 6 (six) hours as needed for wheezing or shortness of breath (cough, shortness of breath or wheezing.).    Dispense:  1 Inhaler    Refill:  2    Return if symptoms worsen or fail to improve.    I personally performed the  services described in this documentation, which was scribed in my presence. The recorded information has been reviewed and considered.   Malka Bocek Elayne Guerin, M.D. [facility] [address] [place], [place]  [address] [phone] phone [phone] fax

## 2014-10-13 NOTE — Patient Instructions (Signed)

## 2014-10-25 ENCOUNTER — Ambulatory Visit: Payer: Federal, State, Local not specified - PPO

## 2014-11-15 ENCOUNTER — Ambulatory Visit: Payer: Federal, State, Local not specified - PPO

## 2014-12-13 ENCOUNTER — Ambulatory Visit: Payer: Federal, State, Local not specified - PPO

## 2014-12-13 DIAGNOSIS — M779 Enthesopathy, unspecified: Secondary | ICD-10-CM

## 2014-12-21 ENCOUNTER — Inpatient Hospital Stay (HOSPITAL_COMMUNITY)
Admission: EM | Admit: 2014-12-21 | Discharge: 2014-12-22 | DRG: 310 | Disposition: A | Discharge status: Home / Self Care | Payer: Federal, State, Local not specified - PPO | Attending: Cardiology | Admitting: Cardiology

## 2014-12-21 ENCOUNTER — Encounter (HOSPITAL_COMMUNITY): Payer: Self-pay | Admitting: Emergency Medicine

## 2014-12-21 DIAGNOSIS — R002 Palpitations: Principal | ICD-10-CM | POA: Diagnosis present

## 2014-12-21 DIAGNOSIS — R7989 Other specified abnormal findings of blood chemistry: Secondary | ICD-10-CM | POA: Diagnosis present

## 2014-12-21 DIAGNOSIS — J45909 Unspecified asthma, uncomplicated: Secondary | ICD-10-CM | POA: Diagnosis present

## 2014-12-21 DIAGNOSIS — Z79899 Other long term (current) drug therapy: Secondary | ICD-10-CM | POA: Diagnosis not present

## 2014-12-21 DIAGNOSIS — F329 Major depressive disorder, single episode, unspecified: Secondary | ICD-10-CM | POA: Diagnosis present

## 2014-12-21 DIAGNOSIS — I2510 Atherosclerotic heart disease of native coronary artery without angina pectoris: Secondary | ICD-10-CM | POA: Diagnosis present

## 2014-12-21 DIAGNOSIS — R778 Other specified abnormalities of plasma proteins: Secondary | ICD-10-CM | POA: Diagnosis present

## 2014-12-21 DIAGNOSIS — I493 Ventricular premature depolarization: Secondary | ICD-10-CM | POA: Diagnosis present

## 2014-12-21 DIAGNOSIS — Z7982 Long term (current) use of aspirin: Secondary | ICD-10-CM | POA: Diagnosis not present

## 2014-12-21 HISTORY — DX: Unspecified asthma, uncomplicated: J45.909

## 2014-12-21 HISTORY — DX: Other seasonal allergic rhinitis: J30.2

## 2014-12-21 HISTORY — DX: Unspecified chronic bronchitis: J42

## 2014-12-21 HISTORY — DX: Primary osteoarthritis, right ankle and foot: M19.071

## 2014-12-21 LAB — CK TOTAL AND CKMB (NOT AT ARMC)
CK, MB: 4.6 ng/mL — ABNORMAL HIGH (ref 0.3–4.0)
Relative Index: 3 — ABNORMAL HIGH (ref 0.0–2.5)
Total CK: 153 U/L (ref 7–177)

## 2014-12-21 LAB — CBC
HCT: 36.1 % (ref 36.0–46.0)
HEMOGLOBIN: 12.8 g/dL (ref 12.0–15.0)
MCH: 26.4 pg (ref 26.0–34.0)
MCHC: 35.5 g/dL (ref 30.0–36.0)
MCV: 74.4 fL — ABNORMAL LOW (ref 78.0–100.0)
Platelets: 292 10*3/uL (ref 150–400)
RBC: 4.85 MIL/uL (ref 3.87–5.11)
RDW: 14.5 % (ref 11.5–15.5)
WBC: 10.1 10*3/uL (ref 4.0–10.5)

## 2014-12-21 LAB — TSH: TSH: 1.783 u[IU]/mL (ref 0.350–4.500)

## 2014-12-21 LAB — MAGNESIUM
MAGNESIUM: 1.9 mg/dL (ref 1.5–2.5)
Magnesium: 1.9 mg/dL (ref 1.5–2.5)

## 2014-12-21 LAB — BASIC METABOLIC PANEL
Anion gap: 6 (ref 5–15)
BUN: 11 mg/dL (ref 6–23)
CHLORIDE: 105 mmol/L (ref 96–112)
CO2: 27 mmol/L (ref 19–32)
Calcium: 9.2 mg/dL (ref 8.4–10.5)
Creatinine, Ser: 0.81 mg/dL (ref 0.50–1.10)
GFR calc Af Amer: >90 mL/min (ref >90)
GFR calc non Af Amer: 90 mL/min — ABNORMAL LOW (ref >90)
Glucose, Bld: 87 mg/dL (ref 70–99)
Potassium: 3.8 mmol/L (ref 3.5–5.1)
Sodium: 138 mmol/L (ref 135–145)

## 2014-12-21 LAB — I-STAT TROPONIN, ED: Troponin i, poc: 0.89 ng/mL (ref 0.00–0.08)

## 2014-12-21 LAB — TROPONIN I
TROPONIN I: 1.39 ng/mL — AB (ref <0.031)
Troponin I: 2.05 ng/mL (ref <0.031)
Troponin I: 1.54 ng/mL

## 2014-12-21 LAB — D-DIMER, QUANTITATIVE (NOT AT ARMC): D-Dimer, Quant: <0.27 ug/mL-FEU (ref 0.00–0.48)

## 2014-12-21 LAB — HEPARIN LEVEL (UNFRACTIONATED): HEPARIN UNFRACTIONATED: 0.2 [IU]/mL — AB (ref 0.30–0.70)

## 2014-12-21 MED ORDER — ASPIRIN 300 MG RE SUPP: 300.0000 mg | RECTAL | Status: DC

## 2014-12-21 MED ORDER — ADULT MULTIVITAMIN W/MINERALS CH
1.0000 | ORAL_TABLET | Freq: Every day | ORAL | Status: DC
  Administered 2014-12-22: 1 via ORAL
  Filled 2014-12-21 (×2): qty 1

## 2014-12-21 MED ORDER — HEPARIN BOLUS VIA INFUSION
4000.0000 [IU] | Freq: Once | INTRAVENOUS | Status: AC
  Administered 2014-12-21: 4000 [IU] via INTRAVENOUS
  Filled 2014-12-21: qty 4000

## 2014-12-21 MED ORDER — SODIUM CHLORIDE 0.9 % IJ SOLN: 3.0000 mL | INTRAMUSCULAR | Status: DC | PRN

## 2014-12-21 MED ORDER — HEPARIN (PORCINE) IN NACL 100-0.45 UNIT/ML-% IJ SOLN
900.0000 [IU]/h | INTRAMUSCULAR | Status: DC
  Administered 2014-12-21: 900 [IU]/h via INTRAVENOUS
  Filled 2014-12-21: qty 250

## 2014-12-21 MED ORDER — ASPIRIN 325 MG PO TABS
325.0000 mg | ORAL_TABLET | Freq: Once | ORAL | Status: AC
  Administered 2014-12-21: 325 mg via ORAL
  Filled 2014-12-21: qty 1

## 2014-12-21 MED ORDER — ONDANSETRON HCL 4 MG/2ML IJ SOLN
4.0000 mg | Freq: Four times a day (QID) | INTRAMUSCULAR | Status: DC | PRN
Start: 2014-12-21 — End: 2014-12-22

## 2014-12-21 MED ORDER — ACETAMINOPHEN 325 MG PO TABS: 650.0000 mg | ORAL_TABLET | ORAL | Status: DC | PRN

## 2014-12-21 MED ORDER — HEPARIN (PORCINE) IN NACL 100-0.45 UNIT/ML-% IJ SOLN
1100.0000 [IU]/h | INTRAMUSCULAR | Status: DC
  Administered 2014-12-22: 1050 [IU]/h via INTRAVENOUS
  Filled 2014-12-21 (×3): qty 250

## 2014-12-21 MED ORDER — FLUTICASONE PROPIONATE HFA 44 MCG/ACT IN AERO
1.0000 | INHALATION_SPRAY | Freq: Two times a day (BID) | RESPIRATORY_TRACT | Status: DC
  Administered 2014-12-21: 1 via RESPIRATORY_TRACT
  Filled 2014-12-21: qty 10.6

## 2014-12-21 MED ORDER — ASPIRIN 81 MG PO CHEW: 324.0000 mg | CHEWABLE_TABLET | ORAL | Status: DC

## 2014-12-21 MED ORDER — ASPIRIN EC 81 MG PO TBEC
81.0000 mg | DELAYED_RELEASE_TABLET | Freq: Every day | ORAL | Status: DC
Start: 2014-12-22 — End: 2014-12-22
  Administered 2014-12-22: 81 mg via ORAL
  Filled 2014-12-21: qty 1

## 2014-12-21 MED ORDER — SODIUM CHLORIDE 0.9 % IV SOLN
250.0000 mL | INTRAVENOUS | Status: DC | PRN
  Administered 2014-12-21: 250 mL via INTRAVENOUS

## 2014-12-21 MED ORDER — FLUTICASONE PROPIONATE 50 MCG/ACT NA SUSP: 2.0000 | Freq: Every day | NASAL | Status: DC

## 2014-12-21 MED ORDER — ALBUTEROL SULFATE (2.5 MG/3ML) 0.083% IN NEBU: 2.5000 mg | INHALATION_SOLUTION | Freq: Four times a day (QID) | RESPIRATORY_TRACT | Status: DC | PRN

## 2014-12-21 MED ORDER — NITROGLYCERIN 0.4 MG SL SUBL: 0.4000 mg | SUBLINGUAL_TABLET | SUBLINGUAL | Status: DC | PRN

## 2014-12-21 MED ORDER — SODIUM CHLORIDE 0.9 % IJ SOLN: 3.0000 mL | Freq: Two times a day (BID) | INTRAMUSCULAR | Status: DC

## 2014-12-21 NOTE — Progress Notes (Signed)
ANTICOAGULATION CONSULT NOTE - Initial Consult  Pharmacy Consult for heparin Indication: chest pain/ACS  No Known Allergies  Patient Measurements: Height: 5' (152.4 cm) Weight: 192 lb (87.091 kg) IBW/kg (Calculated) : 45.5 Heparin Dosing Weight: 66kg  Vital Signs: Temp: 98.3 F (36.8 C) (03/16 0436) Temp Source: Oral (03/16 0436) BP: 106/55 mmHg (03/16 1000) Pulse Rate: 88 (03/16 1000)  Labs:  Recent Labs  12/21/14 0645  HGB 12.8  HCT 36.1  PLT 292  CREATININE 0.81  CKTOTAL 153  CKMB 4.6*  TROPONINI 2.05*    Estimated Creatinine Clearance: 90.5 mL/min (by C-G formula based on Cr of 0.81).   Medical History: Past Medical History  Diagnosis Date  . Yeast infection   . Bacterial infection   . Blood type, Rh negative   . Allergy   . Arthritis   . Depression   . Substance abuse   . Headache(784.0)     Medications:  Infusions:  . heparin    . heparin      Assessment: 72 yof presented to the ED with palpitations. Troponin found to be elevated. To start IV heparin. Baseline CBC is WNL and she is not on any anticoagulation PTA.  Goal of Therapy:  Heparin level 0.3-0.7 units/ml Monitor platelets by anticoagulation protocol: Yes   Plan:  - Heparin bolus 4000 units IV x 1 - Heparin gtt 900 units/hr - Check a 6 hour heparin level - Daily heparin level and CBC  Ali Mclaurin, Rande Lawman 12/21/2014,10:56 AM

## 2014-12-21 NOTE — ED Provider Notes (Signed)
Patient presented to the ER with palpitations. Has had intermittently for some time, became worse last night. No chest pain with symptoms.  Face to face Exam: HEENT - PERRLA Lungs - CTAB Heart - RRR, no M/R/G Abd - S/NT/ND Neuro - alert, oriented x3  Plan: Patient with no noted arrhythmia here in the ER. Markedly elevated troponin, cardiology to see.   Orpah Greek, MD 12/21/14 1003

## 2014-12-21 NOTE — ED Notes (Signed)
CRITICAL VALUE ALERT  Critical value received:  Troponin 2.05  Date of notification: 12/21/2014 Time of notification:  0850  Critical value read back: yes  Nurse who received alert:  Renne Crigler RN   MD Notified: Betsey Holiday MD

## 2014-12-21 NOTE — ED Notes (Signed)
Cardiology at bedside.

## 2014-12-21 NOTE — ED Notes (Signed)
Pt c/o palpitations that started last night at 2200. Pt denies any pain or other sx.

## 2014-12-21 NOTE — ED Notes (Signed)
Per 2W, RN did not know she was getting a patient at this time. Bed assigned for 16 minutes. Will return call.

## 2014-12-21 NOTE — Progress Notes (Signed)
ANTICOAGULATION CONSULT NOTE - FOLLOW UP    HL = 0.2 (goal 0.3 - 0.7 units/mL) Heparin dosing weight = 66 kg   Assessment: 38 YOF with elevated troponin to continue on IV heparin.  Heparin level sub-therapeutic.  No bleeding nor complication with infusion reported.   Plan: - Increase heparin gtt to 1050 units/hr - Check 6 hr HL    Sherri Buckley D. Mina Marble, PharmD, BCPS 12/21/2014, 8:03 PM

## 2014-12-21 NOTE — ED Provider Notes (Signed)
CSN: [id number]     Arrival date & time 12/21/14  4401 History   First MD Initiated Contact with Patient 12/21/14 0606     Chief Complaint  Patient presents with  . Palpitations     (Consider location/radiation/quality/duration/timing/severity/associated sxs/prior Treatment) HPI Comments: Sherri Buckley is a 39 y.o. female with a PMHx of chronic headaches, depression, arthritis, and substance abuse, who presents to the ED with complaints of recurrent intermittent palpitations last occurring at 10 PM. She states she's had these in the past but has never been evaluated for them. She reports they feel like a "fluttering" in the middle of her chest, but denies any pain or pressure related to fluttering, as it is currently resolved at this time. She denies any known eliciting factors, denies any dehydration, caffeine, attempted suppressant use, illicit drug use, or stimulant use. She has been attempting to lose weight recently but denies any correlation to exercise with her symptoms. Reports that she has not been drinking any sodas or coffee, only drinking water. Denies any alcohol use. Denies any history of known heart disease, A. fib, thyroid disease, DVT/PE, or over-the-counter sinus medications. Denies any fevers, chills, chest pain, chest pressure, cough, orthopnea, PND, diaphoresis, hemoptysis, leg swelling, recent travel/surgery/immobilizations, shortness of breath, abdominal pain, nausea, vomiting, diarrhea, constipation, dysuria, hematuria, vaginal bleeding or discharge, numbness, tingling, weakness, vision changes, presyncope, dizziness, fatigue, or unintentional weight loss.  Patient is a 39 y.o. female presenting with palpitations. The history is provided by the patient. No language interpreter was used.  Palpitations Palpitations quality:  Irregular Onset quality:  Sudden Duration:  1 minute Timing:  Intermittent Progression:  Resolved Chronicity:  Recurrent Context: not appetite  suppressants, not caffeine, not dehydration, not illicit drugs and not stimulant use   Relieved by:  None tried Worsened by:  Nothing Ineffective treatments:  None tried Associated symptoms: no chest pain, no chest pressure, no cough, no diaphoresis, no dizziness, no hemoptysis, no leg pain, no lower extremity edema, no malaise/fatigue, no nausea, no near-syncope, no numbness, no orthopnea, no PND, no shortness of breath, no syncope, no vomiting and no weakness   Risk factors: no diabetes mellitus, no heart disease, no hx of atrial fibrillation, no hx of DVT, no hx of PE, no hx of thyroid disease, no hyperthyroidism and no OTC sinus medications     Past Medical History  Diagnosis Date  . Yeast infection   . Bacterial infection   . Blood type, Rh negative   . Allergy   . Arthritis   . Depression   . Substance abuse   . UUVOZDGU(440.3)    Past Surgical History  Procedure Laterality Date  . Breast surgery     Family History  Problem Relation Age of Onset  . Hypertension Paternal Grandmother   . Diabetes Paternal Grandmother   . Heart disease Maternal Grandmother     9 bypass surgery  . Cancer Maternal Grandmother     mouth  . Sickle cell trait Father   . Seizures Father   . Thyroid disease Mother     as a child  . Hypertension Mother   . Anemia Mother   . Migraines Mother   . Hernia Mother   . Arthritis Maternal Aunt    History  Substance Use Topics  . Smoking status: Never Smoker   . Smokeless tobacco: Never Used  . Alcohol Use: Yes     Comment: Drinks 3x daily   OB History    Gravida Para Term  Preterm AB TAB SAB Ectopic Multiple Living   9 0   8  1   0     Review of Systems  Constitutional: Negative for fever, chills, malaise/fatigue and diaphoresis.  Respiratory: Negative for cough, hemoptysis and shortness of breath.   Cardiovascular: Positive for palpitations. Negative for chest pain, orthopnea, leg swelling, syncope, PND and near-syncope.  Gastrointestinal:  Negative for nausea, vomiting, abdominal pain and diarrhea.  Genitourinary: Negative for dysuria, hematuria, vaginal bleeding and vaginal discharge.  Musculoskeletal: Negative for myalgias and arthralgias.  Skin: Negative for color change.  Neurological: Negative for dizziness, weakness and numbness.  Psychiatric/Behavioral: Negative for confusion.   10 Systems reviewed and are negative for acute change except as noted in the HPI.    Allergies  Review of patient's allergies indicates no known allergies.  Home Medications   Prior to Admission medications   Medication Sig Start Date End Date Taking? Authorizing Provider  albuterol (PROVENTIL HFA;VENTOLIN HFA) 108 (90 BASE) MCG/ACT inhaler Inhale 2 puffs into the lungs every 6 (six) hours as needed for wheezing or shortness of breath (cough, shortness of breath or wheezing.). 10/13/14  Yes Wardell Honour, MD  beclomethasone (QVAR) 40 MCG/ACT inhaler Inhale 2 puffs into the lungs 2 (two) times daily. 10/13/14  Yes Wardell Honour, MD  Multiple Vitamin (MULTIVITAMIN WITH MINERALS) TABS Take 1 tablet by mouth daily.   Yes Historical Provider, MD  fluticasone (FLONASE) 50 MCG/ACT nasal spray Place 2 sprays into both nostrils daily. Patient not taking: Reported on 10/13/2014 03/06/14   Wardell Honour, MD  predniSONE (DELTASONE) 20 MG tablet Three tablets daily x 2 days, then two tablets daily x 5 days, then one tablet daily x 5 days Patient not taking: Reported on 12/21/2014 10/13/14   Wardell Honour, MD  Vitamin D, Ergocalciferol, (DRISDOL) 50000 UNITS CAPS capsule Take 1 capsule (50,000 Units total) by mouth once a week. Patient not taking: Reported on 10/13/2014 09/15/14   Mancel Bale, PA-C   BP 116/71 mmHg  Pulse 99  Temp(Src) 98.3 F (36.8 C) (Oral)  Resp 17  Ht 5' (1.524 m)  Wt 192 lb (87.091 kg)  BMI 37.50 kg/m2  SpO2 100%  LMP 12/16/2014 Physical Exam  Constitutional: She is oriented to person, place, and time. Vital signs are normal. She  appears well-developed and well-nourished.  Non-toxic appearance. No distress.  Afebrile, nontoxic, NAD  HENT:  Head: Normocephalic and atraumatic.  Mouth/Throat: Oropharynx is clear and moist and mucous membranes are normal.  Eyes: Conjunctivae and EOM are normal. Right eye exhibits no discharge. Left eye exhibits no discharge.  Neck: Normal range of motion. Neck supple. No thyromegaly present.  No thyromegaly  Cardiovascular: Normal rate, regular rhythm, normal heart sounds and intact distal pulses.  Exam reveals no gallop and no friction rub.   No murmur heard. RRR, nl s1/s2, no m/r/g, distal pulses intact, no pedal edema   Pulmonary/Chest: Effort normal and breath sounds normal. No respiratory distress. She has no decreased breath sounds. She has no wheezes. She has no rhonchi. She has no rales.  Abdominal: Soft. Normal appearance and bowel sounds are normal. She exhibits no distension. There is no tenderness. There is no rigidity, no rebound and no guarding.  Musculoskeletal: Normal range of motion.  Neurological: She is alert and oriented to person, place, and time. She has normal strength. No sensory deficit.  Skin: Skin is warm, dry and intact. No rash noted.  Psychiatric: She has a normal mood  and affect.  Nursing note and vitals reviewed.   ED Course  Procedures (including critical care time) Labs Review Labs Reviewed  CBC - Abnormal; Notable for the following:    MCV 74.4 (*)    All other components within normal limits  BASIC METABOLIC PANEL - Abnormal; Notable for the following:    GFR calc non Af Amer 90 (*)    All other components within normal limits  TROPONIN I - Abnormal; Notable for the following:    Troponin I 2.05 (*)    All other components within normal limits  CK TOTAL AND CKMB - Abnormal; Notable for the following:    CK, MB 4.6 (*)    Relative Index 3.0 (*)    All other components within normal limits  I-STAT TROPOININ, ED - Abnormal; Notable for the  following:    Troponin i, poc 0.89 (*)    All other components within normal limits  MAGNESIUM  D-DIMER, QUANTITATIVE  HEPARIN LEVEL (UNFRACTIONATED)    Imaging Review No results found.   EKG Interpretation   Date/Time:  Wednesday December 21 2014 04:35:33 EDT Ventricular Rate:  94 PR Interval:  154 QRS Duration: 77 QT Interval:  344 QTC Calculation: 430 R Axis:   61 Text Interpretation:  Sinus rhythm Nonspecific T abnormalities, lateral  leads Baseline wander in lead(s) I II III aVR aVL aVF Confirmed by OTTER   MD, OLGA ([id number]) on 12/21/2014 5:20:12 AM      MDM   Final diagnoses:  Palpitations  Elevated troponin    39 y.o. female here with palpitations. None ongoing, no pain. Reports that these have occurred in the past. Denies use of caffeine or accelerant medications. Reports good hydration. Denies hx of thyroid disease or s/sx of hyperthyroidism. EKG with some T-wave inversions but no arrhythmias. Will get basic labs and likely refer to cardiology for holter monitoring.   7:04 AM Istat trop elevated at 0.89. Dr. Sharol Given in to evaluate pt, will order formal troponin and CK levels. Chart review reveals that CK has been elevated in the past. Will continue to monitor.   9:00 AM Formal troponin and CK both returning elevated, trop 2.05 and CKMB 4.6. Will consult cardiology. CBC WNL. BMP WNL. Mg WNL.  9:14 AM Cardiology returning page, Dr. Caryl Comes will come to see the pt.   10:54 AM Dr. Aundra Dubin down to see pt, would like d-dimer ordered on pt, and would like [place] to follow up with results. If positive, obtain CTA. They will be admitting patient now. Will monitor until d-dimer results.   12:26 PM D-dimer neg. Pt cleared from ED standpoint, please see cardiology notes for further documentation of care.   BP 118/67 mmHg  Pulse 86  Temp(Src) 98.3 F (36.8 C) (Oral)  Resp 11  Ht 5' (1.524 m)  Wt 192 lb (87.091 kg)  BMI 37.50 kg/m2  SpO2 99%  LMP 12/16/2014  Meds ordered  this encounter  Medications  . aspirin tablet 325 mg    Sig:   . 0.9 %  sodium chloride infusion    Sig:      Jamarii Banks Camprubi-Soms, PA-C 12/21/14 1228  Linton Flemings, MD 12/21/14 [phone]

## 2014-12-21 NOTE — H&P (Signed)
Physician History and Physical    Sherri Buckley MRN: [id number] DOB/AGE: 01-30-1976 39 y.o. Admit date: 12/21/2014  HPI: 39 yo with history of asthma and no prior cardiac problems came to the ER last night for evaluation of palpitations.  She has had periodic palpitations (brief "flutters") in the past, but last night starting around 10, she would feel a brief flutter that would recur every 5-10 minutes.  No long runs. No lightheadedness or syncope.  Because the palpitations continued, she came to the ER.  In the ER, she has continued to have "flutters."  Telemetry has shown occasional PVCs.  No significant arrhythmias noted. She denies any dyspnea or chest pain.  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  No exercise intolerance.  She had a troponin drawn.  This was initially 0.89 then repeated at 2.05.  No history of venous thromboembolism in herself or in family.  She is not a smoker and does not use OCPs.  She is not sedentary. She has asthma but has not had any recent wheezing. Remote marijuana, no cocaine, no recent ETOH.   Review of systems complete and found to be negative unless listed above   PMH: 1. Asthma 2. Allergic rhinitis 3. Depression  Family History  Problem Relation Age of Onset  . Hypertension Paternal Grandmother   . Diabetes Paternal Grandmother   . Heart disease Maternal Grandmother     9 bypass surgery  . Cancer Maternal Grandmother     mouth  . Sickle cell trait Father   . Seizures Father   . Thyroid disease Mother     as a child  . Hypertension Mother   . Anemia Mother   . Migraines Mother   . Hernia Mother   . Arthritis Maternal Aunt     History   Social History  . Marital Status: Single    Spouse Name: N/A  . Number of Children: N/A  . Years of Education: N/A   Occupational History  .  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 Smoking status: Never Smoker   . Smokeless tobacco:  Never Used  . Alcohol Use: Yes     Comment: Drinks 3x daily  . Drug Use: Yes    Special: Marijuana  . Sexual Activity: Not Currently   Other Topics Concern  . Not on file   Social History Narrative     Current Facility-Administered Medications  Medication Dose Route Frequency Provider Last Rate Last Dose  . albuterol (PROVENTIL) (2.5 MG/3ML) 0.083% nebulizer solution 2.5 mg  2.5 mg Nebulization Once Wardell Honour, MD       Current Outpatient Prescriptions  Medication Sig Dispense Refill  . albuterol (PROVENTIL HFA;VENTOLIN HFA) 108 (90 BASE) MCG/ACT inhaler Inhale 2 puffs into the lungs every 6 (six) hours as needed for wheezing or shortness of breath (cough, shortness of breath or wheezing.). 1 Inhaler 2  . beclomethasone (QVAR) 40 MCG/ACT inhaler Inhale 2 puffs into the lungs 2 (two) times daily. 1 Inhaler 12  . Multiple Vitamin (MULTIVITAMIN WITH MINERALS) TABS Take 1 tablet by mouth daily.    . fluticasone (FLONASE) 50 MCG/ACT nasal spray Place 2 sprays into both nostrils daily. (Patient not taking: Reported on 10/13/2014) 16 g 6  . predniSONE (DELTASONE) 20 MG tablet Three tablets daily x 2 days, then two tablets daily x 5 days, then one tablet daily x 5  days (Patient not taking: Reported on 12/21/2014) 21 tablet 0  . Vitamin D, Ergocalciferol, (DRISDOL) 50000 UNITS CAPS capsule Take 1 capsule (50,000 Units total) by mouth once a week. (Patient not taking: Reported on 10/13/2014) 2 capsule 0    Physical Exam: Blood pressure 106/55, pulse 88, temperature 98.3 F (36.8 C), temperature source Oral, resp. rate 22, height 5' (1.524 m), weight 192 lb (87.091 kg), last menstrual period 12/16/2014, SpO2 99 %.  General: NAD Neck: No JVD, no thyromegaly or thyroid nodule.  Lungs: Clear to auscultation bilaterally with normal respiratory effort. CV: Nondisplaced PMI.  Mildly tachycardic, regular S1/S2, no S3/S4, no murmur.  No peripheral edema.  No carotid bruit.  Normal pedal pulses.    Abdomen: Soft, nontender, no hepatosplenomegaly, no distention.  Skin: Intact without lesions or rashes.  Neurologic: Alert and oriented x 3.  Psych: Normal affect. Extremities: No clubbing or cyanosis.  HEENT: Normal.   Labs:   Lab Results  Component Value Date   WBC 10.1 12/21/2014   HGB 12.8 12/21/2014   HCT 36.1 12/21/2014   MCV 74.4* 12/21/2014   PLT 292 12/21/2014    Recent Labs Lab 12/21/14 0645  NA 138  K 3.8  CL 105  CO2 27  BUN 11  CREATININE 0.81  CALCIUM 9.2  GLUCOSE 87   Lab Results  Component Value Date   CKTOTAL 153 12/21/2014   CKMB 4.6* 12/21/2014   CKMBINDEX 5.0* 02/05/2013   TROPONINI 2.05* 12/21/2014    Radiology: - CXR: no abnormalities  EKG: NSR, difficult baseline but no definite abnormality  ASSESSMENT AND PLAN: 39 yo with history of asthma and no prior cardiac problems came to the ER last night for evaluation of palpitations.  She was found to have elevated troponin. 1. Palpitations: In the ER, these have seemed to correlate to PVCs.  She has been having more of the palpitations since 10 pm last night, but had had them in the past.  Denies drug use, etc.  Will follow for now on telemetry.  Check magnesium level.  2. Elevated troponin: ?Cause for this.  Palpitation history is not very impressive and does not suggest any long run of SVT/VT.  No chest pain or dyspnea.  Nothing to point to her having a PE.  No significant cardiac risk factors (no smoking, family history, etc).  - I will keep her overnight on telemetry.  If rest of workup negative, will need 30 day monitor at discharge to look for more significant arrhythmias.  - Echocardiogram - Will check D dimer.  If elevated, will need CTA chest.  - Cycle troponin for now and will get repeat ECG as baseline ECG difficult.  - Will keep her on heparin gtt for now.  - Further workup will depend upon this initial evaluation.   Signed: Loralie Champagne 12/21/2014, 10:54 AM

## 2014-12-21 NOTE — Progress Notes (Signed)
Pt arrived to 2W32 from ED in NAD. Pt walked from stretcher to room with no assistance. Pt hooked to monitor. Will continue to monitor pt closely.

## 2014-12-22 ENCOUNTER — Inpatient Hospital Stay (HOSPITAL_COMMUNITY): Payer: Federal, State, Local not specified - PPO

## 2014-12-22 ENCOUNTER — Other Ambulatory Visit: Payer: Self-pay | Admitting: Physician Assistant

## 2014-12-22 DIAGNOSIS — I25119 Atherosclerotic heart disease of native coronary artery with unspecified angina pectoris: Secondary | ICD-10-CM

## 2014-12-22 DIAGNOSIS — R002 Palpitations: Secondary | ICD-10-CM

## 2014-12-22 LAB — CBC
HCT: 37.8 % (ref 36.0–46.0)
Hemoglobin: 13.5 g/dL (ref 12.0–15.0)
MCH: 26.7 pg (ref 26.0–34.0)
MCHC: 35.7 g/dL (ref 30.0–36.0)
MCV: 74.9 fL — AB (ref 78.0–100.0)
Platelets: 301 10*3/uL (ref 150–400)
RBC: 5.05 MIL/uL (ref 3.87–5.11)
RDW: 14.6 % (ref 11.5–15.5)
WBC: 8.9 10*3/uL (ref 4.0–10.5)

## 2014-12-22 LAB — BASIC METABOLIC PANEL
ANION GAP: 10 (ref 5–15)
BUN: 8 mg/dL (ref 6–23)
CO2: 21 mmol/L (ref 19–32)
CREATININE: 0.78 mg/dL (ref 0.50–1.10)
Calcium: 9.2 mg/dL (ref 8.4–10.5)
Chloride: 107 mmol/L (ref 96–112)
GFR calc non Af Amer: >90 mL/min (ref >90)
Glucose, Bld: 83 mg/dL (ref 70–99)
Potassium: 3.7 mmol/L (ref 3.5–5.1)
Sodium: 138 mmol/L (ref 135–145)

## 2014-12-22 LAB — LIPID PANEL
Cholesterol: 142 mg/dL (ref 0–200)
HDL: 42 mg/dL (ref >39)
LDL Cholesterol: 91 mg/dL (ref 0–99)
Total CHOL/HDL Ratio: 3.4 RATIO
Triglycerides: 45 mg/dL (ref <150)
VLDL: 9 mg/dL (ref 0–40)

## 2014-12-22 LAB — HEPARIN LEVEL (UNFRACTIONATED)
Heparin Unfractionated: 0.35 IU/mL (ref 0.30–0.70)
Heparin Unfractionated: 0.3 IU/mL (ref 0.30–0.70)

## 2014-12-22 LAB — TROPONIN I: Troponin I: 1.32 ng/mL (ref <0.031)

## 2014-12-22 MED ORDER — ASPIRIN 81 MG PO TBEC
81.0000 mg | DELAYED_RELEASE_TABLET | Freq: Every day | ORAL | Status: DC
Start: 2014-12-22 — End: 2015-12-12

## 2014-12-22 MED ORDER — REGADENOSON 0.4 MG/5ML IV SOLN
0.4000 mg | Freq: Once | INTRAVENOUS | Status: AC
  Administered 2014-12-22: 0.4 mg via INTRAVENOUS

## 2014-12-22 MED ORDER — TECHNETIUM TC 99M SESTAMIBI GENERIC - CARDIOLITE
30.0000 | Freq: Once | INTRAVENOUS | Status: AC | PRN
  Administered 2014-12-22: 30 via INTRAVENOUS

## 2014-12-22 MED ORDER — TECHNETIUM TC 99M SESTAMIBI GENERIC - CARDIOLITE
10.0000 | Freq: Once | INTRAVENOUS | Status: AC | PRN
  Administered 2014-12-22: 10 via INTRAVENOUS

## 2014-12-22 MED ORDER — REGADENOSON 0.4 MG/5ML IV SOLN
INTRAVENOUS | Status: AC
  Filled 2014-12-22: qty 5

## 2014-12-22 NOTE — Progress Notes (Signed)
Discharge instructions reviewed with patients and all questions answered. IV discontinued. Patient verbalized understanding of all instructions including the importance of keeping all f/u appointments. Patient discharged via wheelchair to personal vehicle.

## 2014-12-22 NOTE — Progress Notes (Signed)
Patient Name: Sherri Buckley Date of Encounter: 12/22/2014  Primary cardiologist: Dr. Aundra Dubin   Principal Problem:   Elevated troponin Active Problems:   Palpitation    SUBJECTIVE  Denies any CP or SOB. Move in [place] heavy boxes, no recent decline in functional status or DOE. Continue to have palpitation after admission.   CURRENT MEDS . aspirin EC  81 mg Oral Daily  . fluticasone  1 puff Inhalation BID  . multivitamin with minerals  1 tablet Oral Daily  . sodium chloride  3 mL Intravenous Q12H    OBJECTIVE  Filed Vitals:   12/21/14 1517 12/21/14 1947 12/21/14 2142 12/22/14 0339  BP: 124/72 109/65  116/74  Pulse: 77 83 66 79  Temp: 98.1 F (36.7 C) 98.1 F (36.7 C)  98.8 F (37.1 C)  TempSrc: Oral Oral  Oral  Resp: 20 18 15 18   Height:      Weight:      SpO2: 100% 100% 100% 100%   No intake or output data in the 24 hours ending 12/22/14 0910 Filed Weights   12/21/14 0436  Weight: 192 lb (87.091 kg)    PHYSICAL EXAM  General: Pleasant, NAD. Neuro: Alert and oriented X 3. Moves all extremities spontaneously. Psych: Normal affect. HEENT:  Normal  Neck: Supple without bruits or JVD. Lungs:  Resp regular and unlabored, CTA. Heart: RRR no s3, s4, or murmurs. Abdomen: Soft, non-tender, non-distended, BS + x 4.  Extremities: No clubbing, cyanosis or edema. DP/PT/Radials 2+ and equal bilaterally.  Accessory Clinical Findings  CBC  Recent Labs  12/21/14 0645 12/22/14 0604  WBC 10.1 8.9  HGB 12.8 13.5  HCT 36.1 37.8  MCV 74.4* 74.9*  PLT 292 542   Basic Metabolic Panel  Recent Labs  12/21/14 0645 12/21/14 1626 12/22/14 0604  NA 138  --  138  K 3.8  --  3.7  CL 105  --  107  CO2 27  --  21  GLUCOSE 87  --  83  BUN 11  --  8  CREATININE 0.81  --  0.78  CALCIUM 9.2  --  9.2  MG 1.9 1.9  --    Cardiac Enzymes  Recent Labs  12/21/14 0645 12/21/14 1626 12/21/14 2104 12/22/14 0604  CKTOTAL 153  --   --   --   CKMB 4.6*  --   --    --   TROPONINI 2.05* 1.54* 1.39* 1.32*   D-Dimer  Recent Labs  12/21/14 0845  DDIMER <0.27   Fasting Lipid Panel  Recent Labs  12/22/14 0535  CHOL 142  HDL 42  LDLCALC 91  TRIG 45  CHOLHDL 3.4   Thyroid Function Tests  Recent Labs  12/21/14 1626  TSH 1.783    TELE NSR with HR 70-80s    ECG  Normal EKG without significant ST-T wave changes  Echocardiogram  pending    Radiology/Studies  No results found.  ASSESSMENT AND PLAN  39 yo with history of asthma and no prior cardiac problems came to the ER last night for evaluation of palpitations. She was found to have elevated troponin.  1. Palpitation  - telemetry did not reveal any SVT despite continue to have symptom, however she does have some episodic sinus tach with HR in 120s, ?if this can cause her symptom  - ideally would add low dose metoprolol 12.5mg  BID to help her symptom, however her BP is borderline.  - pending echocardiogram today, if no significant  WMA, possible discharge with 30 day event monitor  2. Elevated trop: negative d-dimer  - may need inpatient vs outpatient myoview at some point - will discuss with MD, currently NPO  - suspicion for ACS low, denies ever having CP, palpitation episodic.   3. Asthma  Signed, Almyra Deforest PA-C Pager: [phone] History reviewed with patient. No recent symptoms except for palpitations.  These do not sound to be long enough to explain her elevation of troponin.  troponins are trending down. Echo shows no wall motion abnormalities. Lungs are clear. Heart no gallop. Will proceed with inhospital myoview today. She has an ankle injury so will use lexiscan. Home later today if negative and then arrange for 30 day outpatient monitor.

## 2014-12-22 NOTE — Progress Notes (Signed)
*  PRELIMINARY RESULTS* Echocardiogram 2D Echocardiogram has been performed.  Leavy Cella 12/22/2014, 9:12 AM

## 2014-12-22 NOTE — Progress Notes (Signed)
Lexiscan stress test completed. Diffuse TWI in inferior and V3-V6 noted immediately after lexiscan injection, however quickly resolved after only 20 secs. EKG returned to normal after that without significant ST changes  Pending result by [facility]  Signed, Almyra Deforest PA Pager: [phone]

## 2014-12-22 NOTE — Discharge Instructions (Signed)
Cardiac Event Monitoring A cardiac event monitor is a small recording device used to help detect abnormal heart rhythms (arrhythmias). The monitor is used to record heart rhythm when noticeable symptoms such as the following occur:  Fast heartbeats (palpitations), such as heart racing or fluttering.  Dizziness.  Fainting or light-headedness.  Unexplained weakness. The monitor is wired to two electrodes placed on your chest. Electrodes are flat, sticky disks that attach to your skin. The monitor can be worn for up to 30 days. You will wear the monitor at all times, except when bathing.  HOW TO USE YOUR CARDIAC EVENT MONITOR A technician will prepare your chest for the electrode placement. The technician will show you how to place the electrodes, how to work the monitor, and how to replace the batteries. Take time to practice using the monitor before you leave the office. Make sure you understand how to send the information from the monitor to your health care provider. This requires a telephone with a landline, not a cell phone. You need to:  Wear your monitor at all times, except when you are in water:  Do not get the monitor wet.  Take the monitor off when bathing. Do not swim or use a hot tub with it on.  Keep your skin clean. Do not put body lotion or moisturizer on your chest.  Change the electrodes daily or any time they stop sticking to your skin. You might need to use tape to keep them on.  It is possible that your skin under the electrodes could become irritated. To keep this from happening, try to put the electrodes in slightly different places on your chest. However, they must remain in the area under your left breast and in the upper right section of your chest.  Make sure the monitor is safely clipped to your clothing or in a location close to your body that your health care provider recommends.  Press the button to record when you feel symptoms of heart trouble, such as  dizziness, weakness, light-headedness, palpitations, thumping, shortness of breath, unexplained weakness, or a fluttering or racing heart. The monitor is always on and records what happened slightly before you pressed the button, so do not worry about being too late to get good information.  Keep a diary of your activities, such as walking, doing chores, and taking medicine. It is especially important to note what you were doing when you pushed the button to record your symptoms. This will help your health care provider determine what might be contributing to your symptoms. The information stored in your monitor will be reviewed by your health care provider alongside your diary entries.  Send the recorded information as recommended by your health care provider. It is important to understand that it will take some time for your health care provider to process the results.  Change the batteries as recommended by your health care provider. SEEK IMMEDIATE MEDICAL CARE IF:   You have chest pain.  You have extreme difficulty breathing or shortness of breath.  You develop a very fast heartbeat that persists.  You develop dizziness that does not go away.  You faint or constantly feel you are about to faint. Document Released: 07/02/2008 Document Revised: 02/07/2014 Document Reviewed: 03/22/2013 [employer] Patient Information 2015 [employer]. This information is not intended to replace advice given to you by your health care provider. Make sure you discuss any questions you have with your health care provider.  

## 2014-12-22 NOTE — Discharge Summary (Signed)
Discharge Summary   Patient ID: Sherri Buckley,  MRN: [id number], DOB/AGE: 41/21/1977 41 y.o.  Admit date: 12/21/2014 Discharge date: 12/22/2014  Primary Care Provider: [facility] Primary Cardiologist: new- Dr. Mare Ferrari   Discharge Diagnoses Principal Problem:   Elevated troponin Active Problems:   Palpitation   Allergies No Known Allergies  Procedures  Echocardiogram 12/22/2014 LV EF: 60% -  65%  ------------------------------------------------------------------- Indications:   CAD of native vessels 414.01.  ------------------------------------------------------------------- History:  PMH: Elevated Troponin, ETOH, PVCs.  ------------------------------------------------------------------- Study Conclusions  - Left ventricle: The cavity size was normal. Wall thickness was normal. Systolic function was normal. The estimated ejection fraction was in the range of 60% to 65%. Wall motion was normal; there were no regional wall motion abnormalities. Doppler parameters are consistent with abnormal left ventricular relaxation (grade 1 diastolic dysfunction).  Impressions:  - Normal LV function; grade 1 diastolic dysfunction; trace MR.     Cardiac catheterization 12/22/2014 IMPRESSION: 1. No reversible ischemia or infarction.  2. Normal left ventricular wall motion.  3. Left ventricular ejection fraction 73%  4. Low-risk stress test findings*.     Hospital Course  The patient is a 39 year old female with history of asthma and no prior cardiac history who presented to [facility] ED on 12/21/2014 for evaluation of palpitation. She has been having periodic palpitations in the past, however starting on the night of 3/15, she has been having fluttering sensation in her heart every 5-10 minutes. She denies any lightheadedness oh syncope. Telemetry in the ED revealed occasional PVCs, however no significant arrhythmia. She denies any dyspnea or chest  pain. Her initial POC troponin was elevated at 0.89. Subsequent troponin was 2.05. There was no significant ST-T wave changes on telemetry. Her TSH and d-dimer was normal.   She was admitted to cardiology service.  Overnight serial troponin began to trend down from 2.05. There was no good explanation on elevated troponin and her presentation was very atypical. Telemetry showed periodic sinus tachycardia, however we were unable to add beta blocker due to her borderline blood pressure. She underwent Myoview on 3/17 to rule out underlying ischemia induced elevated troponin, which came back low risk with EF 73%, no reversible ischemia or infarction. She obtain echocardiogram on on the same day which showed a normal LV function, grade 1 diastolic dysfunction, trace MR.   She is deemed stable for discharge from cardiology perspective. I will arrange 30 day event monitor before follow-up in the office with Dr. Mare Ferrari.   Discharge Vitals Blood pressure 104/48, pulse 104, temperature 98.1 F (36.7 C), temperature source Oral, resp. rate 17, height 5' (1.524 m), weight 192 lb (87.091 kg), last menstrual period 12/16/2014, SpO2 100 %.  Filed Weights   12/21/14 0436  Weight: 192 lb (87.091 kg)    Labs  CBC  Recent Labs  12/21/14 0645 12/22/14 0604  WBC 10.1 8.9  HGB 12.8 13.5  HCT 36.1 37.8  MCV 74.4* 74.9*  PLT 292 341   Basic Metabolic Panel  Recent Labs  12/21/14 0645 12/21/14 1626 12/22/14 0604  NA 138  --  138  K 3.8  --  3.7  CL 105  --  107  CO2 27  --  21  GLUCOSE 87  --  83  BUN 11  --  8  CREATININE 0.81  --  0.78  CALCIUM 9.2  --  9.2  MG 1.9 1.9  --    Cardiac Enzymes  Recent Labs  12/21/14 0645 12/21/14 1626 12/21/14 2104  12/22/14 0604  CKTOTAL 153  --   --   --   CKMB 4.6*  --   --   --   TROPONINI 2.05* 1.54* 1.39* 1.32*   D-Dimer  Recent Labs  12/21/14 0845  DDIMER <0.27   Fasting Lipid Panel  Recent Labs  12/22/14 0535  CHOL 142  HDL 42    LDLCALC 91  TRIG 45  CHOLHDL 3.4   Thyroid Function Tests  Recent Labs  12/21/14 1626  TSH 1.783    Disposition  Pt is being discharged home today in good condition.  Follow-up Plans & Appointments      Follow-up Information    Follow up with [facility] On 12/26/2014.   Specialty:  Cardiology   Why:  10:00am. Obtain 30 day event monitor   Contact information:   [address] [place] [phone]      Follow up with Ermalinda Barrios, PA-C On 01/23/2015.   Specialty:  Cardiology   Why:  9:15am   Contact information:   [address] [place] [address] [phone]       Discharge Medications    Medication List    TAKE these medications        albuterol 108 (90 BASE) MCG/ACT inhaler  Commonly known as:  PROVENTIL HFA;VENTOLIN HFA  Inhale 2 puffs into the lungs every 6 (six) hours as needed for wheezing or shortness of breath (cough, shortness of breath or wheezing.).     aspirin 81 MG EC tablet  Take 1 tablet (81 mg total) by mouth daily.     beclomethasone 40 MCG/ACT inhaler  Commonly known as:  QVAR  Inhale 2 puffs into the lungs 2 (two) times daily.     multivitamin with minerals Tabs tablet  Take 1 tablet by mouth daily.        Outstanding Labs/Studies  30 day event monitor  Duration of Discharge Encounter   Greater than 30 minutes including physician time.  Hilbert Corrigan PA-C Pager: [phone] 12/22/2014, 4:14 PM

## 2014-12-22 NOTE — Progress Notes (Signed)
ANTICOAGULATION CONSULT NOTE - Follow Up Consult  Pharmacy Consult for Heparin  Indication: chest pain/ACS  No Known Allergies  Patient Measurements: Height: 5' (152.4 cm) Weight: 192 lb (87.091 kg) IBW/kg (Calculated) : 45.5  Vital Signs: Temp: 98.1 F (36.7 C) (03/17 1359) Temp Source: Oral (03/17 1359) BP: 104/48 mmHg (03/17 1359) Pulse Rate: 104 (03/17 1359)  Labs:  Recent Labs  12/21/14 0645 12/21/14 1626 12/21/14 1850 12/21/14 2104 12/22/14 0604 12/22/14 1250  HGB 12.8  --   --   --  13.5  --   HCT 36.1  --   --   --  37.8  --   PLT 292  --   --   --  301  --   HEPARINUNFRC  --   --  0.20*  --  0.35 0.30  CREATININE 0.81  --   --   --  0.78  --   CKTOTAL 153  --   --   --   --   --   CKMB 4.6*  --   --   --   --   --   TROPONINI 2.05* 1.54*  --  1.39* 1.32*  --     Estimated Creatinine Clearance: 91.6 mL/min (by C-G formula based on Cr of 0.78).   Assessment: 44 YOM who presented with palpitations. Patient's troponins were elevated but cause was unknown. They remain elevated today. Currently on heparin infusion for r/o ACS. Planning inpatient myoview today. Suspicion for ACS is low. HL this PM remains therapeutic but on lower end of goal range. No bleeding reported per RN.   Goal of Therapy:  Heparin level 0.3-0.7 units/ml Monitor platelets by anticoagulation protocol: Yes   Plan:  -Increase heparin to 1100 units/hr -Daily CBC/HL -Monitor for bleeding -F/u myoview results   Albertina Parr, PharmD., BCPS Clinical Pharmacist Pager [phone]

## 2014-12-22 NOTE — Progress Notes (Signed)
ANTICOAGULATION CONSULT NOTE - Follow Up Consult  Pharmacy Consult for Heparin  Indication: chest pain/ACS  No Known Allergies  Patient Measurements: Height: 5' (152.4 cm) Weight: 192 lb (87.091 kg) IBW/kg (Calculated) : 45.5  Vital Signs: Temp: 98.8 F (37.1 C) (03/17 0339) Temp Source: Oral (03/17 0339) BP: 116/74 mmHg (03/17 0339) Pulse Rate: 79 (03/17 0339)  Labs:  Recent Labs  12/21/14 0645 12/21/14 1626 12/21/14 1850 12/21/14 2104 12/22/14 0604  HGB 12.8  --   --   --   --   HCT 36.1  --   --   --   --   PLT 292  --   --   --   --   HEPARINUNFRC  --   --  0.20*  --  0.35  CREATININE 0.81  --   --   --   --   CKTOTAL 153  --   --   --   --   CKMB 4.6*  --   --   --   --   TROPONINI 2.05* 1.54*  --  1.39*  --     Estimated Creatinine Clearance: 90.5 mL/min (by C-G formula based on Cr of 0.81).   Assessment: Therapeutic heparin level x 1 after rate increase.   Goal of Therapy:  Heparin level 0.3-0.7 units/ml Monitor platelets by anticoagulation protocol: Yes   Plan:  -Continue heparin at 1050 units/hr -1200 HL -Daily CBC/HL -Monitor for bleeding  Narda Bonds 12/22/2014,7:25 AM

## 2014-12-27 ENCOUNTER — Encounter (INDEPENDENT_AMBULATORY_CARE_PROVIDER_SITE_OTHER): Payer: Federal, State, Local not specified - PPO

## 2014-12-27 ENCOUNTER — Encounter: Payer: Self-pay | Admitting: Radiology

## 2014-12-27 DIAGNOSIS — R002 Palpitations: Secondary | ICD-10-CM

## 2014-12-27 NOTE — Progress Notes (Signed)
Patient ID: Sherri Buckley, female   DOB: 1976/08/27, 39 y.o.   MRN: [id number] [employer] 30 day monitor applied. EOS 01-26-15

## 2015-01-23 ENCOUNTER — Encounter: Payer: Self-pay | Admitting: Physician Assistant

## 2015-01-23 ENCOUNTER — Ambulatory Visit (INDEPENDENT_AMBULATORY_CARE_PROVIDER_SITE_OTHER): Payer: Federal, State, Local not specified - PPO | Admitting: Physician Assistant

## 2015-01-23 VITALS — BP 117/76 | HR 74 | Ht 60.0 in | Wt 192.4 lb

## 2015-01-23 DIAGNOSIS — R778 Other specified abnormalities of plasma proteins: Secondary | ICD-10-CM

## 2015-01-23 DIAGNOSIS — R002 Palpitations: Secondary | ICD-10-CM

## 2015-01-23 DIAGNOSIS — R7989 Other specified abnormal findings of blood chemistry: Secondary | ICD-10-CM | POA: Diagnosis not present

## 2015-01-23 LAB — TROPONIN I: TNIDX: 0.04 ug/l (ref 0.00–0.06)

## 2015-01-23 NOTE — Patient Instructions (Signed)
Medication Instructions:  Your physician recommends that you continue on your current medications as directed. Please refer to the Current Medication list given to you today.   Labwork: LABS TODAY  TROPONIN   Testing/Procedures: NONE TODAY    Follow-Up: Your physician recommends that you schedule a follow-up appointment in:  IN 2 MONTHS WITH DR BRACKBILL  Any Other Special Instructions Will Be Listed Below (If Applicable).  NONE AT THIS TIME

## 2015-01-23 NOTE — Progress Notes (Signed)
Cardiology Office Note   Date:  01/23/2015   ID:  Sherri Buckley, DOB 23-Feb-1976, MRN [id number]  PCP:  Reginia Forts, MD  Cardiologist:  Mare Ferrari Chief Complaint: palpitations    History of Present Illness: Sherri Buckley is a 39 y.o. female who presents for post hospital follow-up after an evaluation for palpitations. She was admitted with a fluttering sensation in her chest every 5-10 minutes and was having occasional PVCs. Her initial troponin was elevated 0.89 and subsequent was 2.05 1.32. There were no EKG changes. TSH and d-dimer were normal. 2-D echo showed normal LV function with grade 1 diastolic dysfunction and trace of MR. Stress Myoview on 12/22/14 was low risk normal LV function EF 73%. No reversible ischemia or infarction. A 30 day event monitor was placed on the patient and she was sent home.  Patient continues to have some palpitations. Her worst episode awakened her from sleep Friday night in which she had associated chills. She was not wearing the monitor at that time because it was being charged. She has had several events and I reviewed her monitor but she only had occasional PAC or PVC and no significant arrhythmia. She is still wearing the monitor and turns again in a week. She would like another troponin drawn.    Past Medical History  Diagnosis Date  . Yeast infection   . Bacterial infection   . Blood type, Rh negative   . Depression   . Substance abuse   . Asthma   . Chronic bronchitis     "get it q yr"  . MHDQQIWL(798.9)     "monthly" (12/21/2014)  . Osteoarthritis of ankle, right   . Seasonal allergies     Past Surgical History  Procedure Laterality Date  . Breast biopsy Right ~ 1993    "took out some benign tissue"     Current Outpatient Prescriptions  Medication Sig Dispense Refill  . albuterol (PROVENTIL HFA;VENTOLIN HFA) 108 (90 BASE) MCG/ACT inhaler Inhale 2 puffs into the lungs every 6 (six) hours as needed for wheezing or shortness of  breath (cough, shortness of breath or wheezing.). 1 Inhaler 2  . aspirin EC 81 MG EC tablet Take 1 tablet (81 mg total) by mouth daily.    . beclomethasone (QVAR) 40 MCG/ACT inhaler Inhale 2 puffs into the lungs 2 (two) times daily. 1 Inhaler 12  . Multiple Vitamin (MULTIVITAMIN WITH MINERALS) TABS Take 1 tablet by mouth daily.     Current Facility-Administered Medications  Medication Dose Route Frequency Provider Last Rate Last Dose  . albuterol (PROVENTIL) (2.5 MG/3ML) 0.083% nebulizer solution 2.5 mg  2.5 mg Nebulization Once Wardell Honour, MD        Allergies:   Review of patient's allergies indicates no known allergies.    Social History:  The patient  reports that she has quit smoking. She has never used smokeless tobacco. She reports that she drinks alcohol. She reports that she uses illicit drugs (Marijuana).   Family History:  The patient's   family history includes Anemia in her mother; Arthritis in her maternal aunt; Cancer in her maternal grandmother; Diabetes in her paternal grandmother; Heart disease in her maternal grandmother; Hernia in her mother; Hypertension in her mother and paternal grandmother; Migraines in her mother; Seizures in her father; Sickle cell trait in her father; Thyroid disease in her mother.    ROS:  Please see the history of present illness.   Otherwise, review of systems are positive for none.  All other systems are reviewed and negative.    PHYSICAL EXAM: VS:  BP 117/76 mmHg  Pulse 74  Ht 5' (1.524 m)  Wt 192 lb 6.4 oz (87.272 kg)  BMI 37.58 kg/m2 , BMI Body mass index is 37.58 kg/(m^2). GEN: Well nourished, well developed, in no acute distress Neck: no JVD, HJR, carotid bruits, or masses Cardiac: RRR; no murmurs,gallop, rubs, thrill or heave,  Respiratory:  clear to auscultation bilaterally, normal work of breathing GI: soft, nontender, nondistended, + BS MS: no deformity or atrophy Extremities: without cyanosis, clubbing, edema, good distal  pulses bilaterally.  Skin: warm and dry, no rash Neuro:  Strength and sensation are intact    EKG:  EKG is ordered today. The ekg ordered today demonstrates normal sinus rhythm, normal EKG   Recent Labs: 12/21/2014: Magnesium 1.9; TSH 1.783 12/22/2014: BUN 8; Creatinine 0.78; Hemoglobin 13.5; Platelets 301; Potassium 3.7; Sodium 138    Lipid Panel    Component Value Date/Time   CHOL 142 12/22/2014 0535   TRIG 45 12/22/2014 0535   HDL 42 12/22/2014 0535   CHOLHDL 3.4 12/22/2014 0535   VLDL 9 12/22/2014 0535   LDLCALC 91 12/22/2014 0535      Wt Readings from Last 3 Encounters:  01/23/15 192 lb 6.4 oz (87.272 kg)  12/21/14 192 lb (87.091 kg)  10/13/14 197 lb 9.6 oz (89.631 kg)      Other studies Reviewed: Additional studies/ records that were reviewed today include and review of the records demonstrates:  Echocardiogram 12/22/2014 LV EF: 60% -   65%  ------------------------------------------------------------------- Indications:      CAD of native vessels 414.01.  ------------------------------------------------------------------- History:   PMH:  Elevated Troponin, ETOH, PVCs.  ------------------------------------------------------------------- Study Conclusions  - Left ventricle: The cavity size was normal. Wall thickness was   normal. Systolic function was normal. The estimated ejection   fraction was in the range of 60% to 65%. Wall motion was normal;   there were no regional wall motion abnormalities. Doppler   parameters are consistent with abnormal left ventricular   relaxation (grade 1 diastolic dysfunction).  Impressions:  - Normal LV function; grade 1 diastolic dysfunction; trace MR.      Stress Myoview 12/22/2014 IMPRESSION:  1. No reversible ischemia or infarction.   2. Normal left ventricular wall motion.   3. Left ventricular ejection fraction 73%   4. Low-risk stress test findings*.         ASSESSMENT AND PLAN:   Sherri Boast, PA-C  01/23/2015 9:38 AM    [facility] [place], [place], [place]  [address] Phone: [phone]; Fax: [phone]

## 2015-01-23 NOTE — Assessment & Plan Note (Addendum)
Patient is requesting that it be repeated. There is no explanation for elevation of troponin in the hospital. EKGs are unchanged and she had a normal stress Myoview and 2-D echo. Her last troponin was 1.32.

## 2015-01-23 NOTE — Assessment & Plan Note (Signed)
Patient continues to have some palpitations but they are not associated with any dizziness or presyncope. She does not want [place] take a beta blocker. They could not start 1 in the hospital because of low blood pressure. Her blood pressure is fine today and she could take one but would like to hold off. Follow-up with Dr. Mare Ferrari in 2 months. Avoid caffeine. I reviewed her events on her current monitor and there was no significant arrhythmia. She will be notified once it is officially read.

## 2015-01-30 ENCOUNTER — Telehealth: Payer: Self-pay | Admitting: *Deleted

## 2015-01-30 NOTE — Telephone Encounter (Signed)
-----   Message from Imogene Burn, PA-C sent at 01/23/2015  3:10 PM EDT ----- Troponin back to normal.

## 2015-02-06 ENCOUNTER — Telehealth: Payer: Self-pay | Admitting: Cardiology

## 2015-02-06 NOTE — Telephone Encounter (Signed)
New message         Pt returning [place] call

## 2015-02-06 NOTE — Telephone Encounter (Signed)
Event monitor reviewed by  Dr. Mare Ferrari   Interpretation: agree with tech comments of sinus rhythm (HR WNL) and sinus tachycardia x 1 HR 102  Advised patient

## 2015-02-06 NOTE — Telephone Encounter (Signed)
Left message to call back  

## 2015-02-13 ENCOUNTER — Ambulatory Visit (INDEPENDENT_AMBULATORY_CARE_PROVIDER_SITE_OTHER): Payer: Federal, State, Local not specified - PPO | Admitting: Family Medicine

## 2015-02-13 ENCOUNTER — Encounter: Payer: Self-pay | Admitting: Family Medicine

## 2015-02-13 VITALS — BP 102/80 | HR 85 | Temp 97.5°F | Resp 16 | Ht 62.0 in | Wt 188.0 lb

## 2015-02-13 DIAGNOSIS — E559 Vitamin D deficiency, unspecified: Secondary | ICD-10-CM | POA: Diagnosis not present

## 2015-02-13 DIAGNOSIS — E669 Obesity, unspecified: Secondary | ICD-10-CM | POA: Diagnosis not present

## 2015-02-13 DIAGNOSIS — E66811 Obesity, class 1: Secondary | ICD-10-CM

## 2015-02-13 DIAGNOSIS — L659 Nonscarring hair loss, unspecified: Secondary | ICD-10-CM | POA: Diagnosis not present

## 2015-02-13 DIAGNOSIS — Z0000 Encounter for general adult medical examination without abnormal findings: Secondary | ICD-10-CM | POA: Diagnosis not present

## 2015-02-13 DIAGNOSIS — Z23 Encounter for immunization: Secondary | ICD-10-CM | POA: Diagnosis not present

## 2015-02-13 DIAGNOSIS — R7989 Other specified abnormal findings of blood chemistry: Secondary | ICD-10-CM | POA: Diagnosis not present

## 2015-02-13 DIAGNOSIS — Z131 Encounter for screening for diabetes mellitus: Secondary | ICD-10-CM | POA: Diagnosis not present

## 2015-02-13 DIAGNOSIS — R778 Other specified abnormalities of plasma proteins: Secondary | ICD-10-CM

## 2015-02-13 LAB — COMPREHENSIVE METABOLIC PANEL
ALBUMIN: 3.8 g/dL (ref 3.5–5.2)
ALK PHOS: 49 U/L (ref 39–117)
ALT: 16 U/L (ref 0–35)
AST: 21 U/L (ref 0–37)
BILIRUBIN TOTAL: 0.5 mg/dL (ref 0.2–1.2)
BUN: 18 mg/dL (ref 6–23)
CO2: 26 mEq/L (ref 19–32)
Calcium: 9.6 mg/dL (ref 8.4–10.5)
Chloride: 102 mEq/L (ref 96–112)
Creat: 0.76 mg/dL (ref 0.50–1.10)
Glucose, Bld: 90 mg/dL (ref 70–99)
Potassium: 4.4 mEq/L (ref 3.5–5.3)
SODIUM: 135 meq/L (ref 135–145)
TOTAL PROTEIN: 7.2 g/dL (ref 6.0–8.3)

## 2015-02-13 LAB — POCT URINALYSIS DIPSTICK
Bilirubin, UA: NEGATIVE
GLUCOSE UA: NEGATIVE
Ketones, UA: NEGATIVE
Leukocytes, UA: NEGATIVE
NITRITE UA: NEGATIVE
PH UA: 5.5
Protein, UA: NEGATIVE
Spec Grav, UA: 1.02
Urobilinogen, UA: 0.2

## 2015-02-13 LAB — IRON
IRON: 36 ug/dL — AB (ref 42–145)
Iron: 37 ug/dL — ABNORMAL LOW (ref 42–145)

## 2015-02-13 LAB — IBC PANEL
%SAT: 9 % — ABNORMAL LOW (ref 20–55)
TIBC: 399 ug/dL (ref 250–470)
UIBC: 362 ug/dL (ref 125–400)

## 2015-02-13 LAB — HEMOGLOBIN A1C
Hgb A1c MFr Bld: 5.1 % (ref <5.7)
MEAN PLASMA GLUCOSE: 100 mg/dL (ref <117)

## 2015-02-13 LAB — VITAMIN B12: VITAMIN B 12: 824 pg/mL (ref 211–911)

## 2015-02-13 NOTE — Progress Notes (Signed)
Subjective:    Patient ID: Sherri Buckley, female    DOB: June 09, 1976, 39 y.o.   MRN: [id number]  02/13/2015  CPE   HPI This 39 y.o. female presents for Complete Physical Examination.  Last physical: 02-2014 with Dr. Alben Spittle at [facility]. Pap smear:  05/2014 Gynecologist; [facility]; WNL.  Regular menses (6 days, no cramping, heavy to 3 days) Mammogram:  05-23-2014 WNL; [facility] Colonoscopy:  never TDAP:  Unsure. Pneumovax: never Influenza:  refuses Eye exam:  2015; off of [place].  No contacts or glasses. Dental exam:  Overdue; last dental exam over ten years.   Palpitations/chest pain: admitted due to elevated troponin.  Admitted for 1.5 days; Troponin trended down; s/p stress testing and echo.  Cardiology recommended event monitor; completed 30 day monitor.  Follow-up visit with cardiology with normal Troponin.  Good experience.  Obesity: has lost 12 pounds on [employer].  Started 11/2014.  Has membership at the [employer].   Review of Systems  Constitutional: Negative for fever, chills, diaphoresis, activity change, appetite change, fatigue and unexpected weight change.  HENT: Negative for congestion, dental problem, drooling, ear discharge, ear pain, facial swelling, hearing loss, mouth sores, nosebleeds, postnasal drip, rhinorrhea, sinus pressure, sneezing, sore throat, tinnitus, trouble swallowing and voice change.   Eyes: Negative for photophobia, pain, discharge, redness, itching and visual disturbance.  Respiratory: Negative for apnea, cough, choking, chest tightness, shortness of breath, wheezing and stridor.   Cardiovascular: Negative for chest pain, palpitations and leg swelling.  Gastrointestinal: Negative for nausea, vomiting, abdominal pain, diarrhea, constipation, blood in stool, abdominal distention, anal bleeding and rectal pain.  Endocrine: Negative for cold intolerance, heat intolerance, polydipsia, polyphagia and polyuria.  Genitourinary: Negative for dysuria, urgency,  frequency, hematuria, flank pain, decreased urine volume, vaginal bleeding, vaginal discharge, enuresis, difficulty urinating, genital sores, vaginal pain, menstrual problem, pelvic pain and dyspareunia.  Musculoskeletal: Negative for myalgias, back pain, joint swelling, arthralgias, gait problem, neck pain and neck stiffness.  Skin: Negative for color change, pallor, rash and wound.  Allergic/Immunologic: Negative for environmental allergies, food allergies and immunocompromised state.  Neurological: Negative for dizziness, tremors, seizures, syncope, facial asymmetry, speech difficulty, weakness, light-headedness, numbness and headaches.  Hematological: Negative for adenopathy. Does not bruise/bleed easily.  Psychiatric/Behavioral: Negative for suicidal ideas, hallucinations, behavioral problems, confusion, sleep disturbance, self-injury, dysphoric mood, decreased concentration and agitation. The patient is not nervous/anxious and is not hyperactive.     Past Medical History  Diagnosis Date  . Blood type, Rh negative   . Substance abuse   . Asthma   . Chronic bronchitis     "get it q yr"  . QPRFFMBW(466.5)     "monthly" (12/21/2014)  . Osteoarthritis of ankle, right   . Seasonal allergies   . Allergy   . Depression 10/07/1998   Past Surgical History  Procedure Laterality Date  . Breast biopsy Right ~ 1993    "took out some benign tissue"   No Known Allergies Current Outpatient Prescriptions  Medication Sig Dispense Refill  . albuterol (PROVENTIL HFA;VENTOLIN HFA) 108 (90 BASE) MCG/ACT inhaler Inhale 2 puffs into the lungs every 6 (six) hours as needed for wheezing or shortness of breath (cough, shortness of breath or wheezing.). 1 Inhaler 2  . beclomethasone (QVAR) 40 MCG/ACT inhaler Inhale 2 puffs into the lungs 2 (two) times daily. 1 Inhaler 12  . loratadine (CLARITIN) 10 MG tablet Take 10 mg by mouth daily.    . Multiple Vitamin (MULTIVITAMIN WITH MINERALS) TABS Take 1 tablet by  mouth daily.    Marland Kitchen aspirin EC 81 MG EC tablet Take 1 tablet (81 mg total) by mouth daily. (Patient not taking: Reported on 02/13/2015)     Current Facility-Administered Medications  Medication Dose Route Frequency Provider Last Rate Last Dose  . albuterol (PROVENTIL) (2.5 MG/3ML) 0.083% nebulizer solution 2.5 mg  2.5 mg Nebulization Once Wardell Honour, MD           Objective:    BP 102/80 mmHg  Pulse 85  Temp(Src) 97.5 F (36.4 C) (Oral)  Resp 16  Ht 5\' 2"  (1.575 m)  Wt 188 lb (85.276 kg)  BMI 34.38 kg/m2  SpO2 98%  LMP 02/07/2015 Physical Exam  Constitutional: She is oriented to person, place, and time. She appears well-developed and well-nourished. No distress.  HENT:  Head: Normocephalic and atraumatic.  Right Ear: External ear normal.  Left Ear: External ear normal.  Nose: Nose normal.  Mouth/Throat: Oropharynx is clear and moist.  Eyes: Conjunctivae and EOM are normal. Pupils are equal, round, and reactive to light.  Neck: Normal range of motion and full passive range of motion without pain. Neck supple. No JVD present. Carotid bruit is not present. No thyromegaly present.  Cardiovascular: Normal rate, regular rhythm and normal heart sounds.  Exam reveals no gallop and no friction rub.   No murmur heard. Pulmonary/Chest: Effort normal and breath sounds normal. She has no wheezes. She has no rales.  Abdominal: Soft. Bowel sounds are normal. She exhibits no distension and no mass. There is no tenderness. There is no rebound and no guarding.  Musculoskeletal:       Right shoulder: Normal.       Left shoulder: Normal.       Cervical back: Normal.  Lymphadenopathy:    She has no cervical adenopathy.  Neurological: She is alert and oriented to person, place, and time. She has normal reflexes. No cranial nerve deficit. She exhibits normal muscle tone. Coordination normal.  Skin: Skin is warm and dry. No rash noted. She is not diaphoretic. No erythema. No pallor.  Psychiatric:  She has a normal mood and affect. Her behavior is normal. Judgment and thought content normal.  Nursing note and vitals reviewed.  Results for orders placed or performed in visit on 01/23/15  Troponin I  Result Value Ref Range   TNIDX 0.04 0.00 - 0.06 ug/l       Assessment & Plan:   1. Routine physical examination   2. Hair thinning   3. Troponin level elevated   4. Vitamin D deficiency   5. Screening for diabetes mellitus   6. Obesity (BMI 30.0-34.9)     Meds ordered this encounter  Medications  . loratadine (CLARITIN) 10 MG tablet    Sig: Take 10 mg by mouth daily.    No Follow-up on file.     Rhyse Skowron Elayne Guerin, M.D. [facility] [address] [place], [place]  [address] [phone] phone [phone] fax

## 2015-02-13 NOTE — Patient Instructions (Signed)
Eye Doctors in [place]:  [place]; [facility];   Dermatologist:  [facility]  Keeping You Healthy  Get These Tests 1. Blood Pressure- Have your blood pressure checked once a year by your health care provider.  Normal blood pressure is 120/80. 2. Weight- Have your body mass index (BMI) calculated to screen for obesity.  BMI is measure of body fat based on height and weight.  You can also calculate your own BMI at GravelBags.it. 3. Cholesterol- Have your cholesterol checked every 5 years starting at age 9 then yearly starting at age 69. 59. Chlamydia, HIV, and other sexually transmitted diseases- Get screened every year until age 69, then within three months of each new sexual provider. 5. Pap Smear- Every 1-5 years; discuss with your health care provider. 6. Mammogram- Every 1-2 years starting at age 76--50  Take these medicines  Calcium with Vitamin D-Your body needs 1200 mg of Calcium each day and [id number] IU of Vitamin D daily.  Your body can only absorb 500 mg of Calcium at a time so Calcium must be taken in 2 or 3 divided doses throughout the day.  Multivitamin with folic acid- Once daily if it is possible for you to become pregnant.  Get these Immunizations  Gardasil-Series of three doses; prevents HPV related illness such as genital warts and cervical cancer.  Menactra-Single dose; prevents meningitis.  Tetanus shot- Every 10 years.  Flu shot-Every year.  Take these steps 1. Do not smoke-Your healthcare provider can help you quit.  For tips on how to quit go to www.smokefree.gov or call 1-800 QUITNOW. 2. Be physically active- Exercise 5 days a week for at least 30 minutes.  If you are not already physically active, start slow and gradually work up to 30 minutes of moderate physical activity.  Examples of moderate activity include walking briskly, dancing, swimming, bicycling, etc. 3. Breast Cancer- A self breast exam every  month is important for early detection of breast cancer.  For more information and instruction on self breast exams, ask your healthcare provider or https://www.patel.info/. 4. Eat a healthy diet- Eat a variety of healthy foods such as fruits, vegetables, whole grains, low fat milk, low fat cheeses, yogurt, lean meats, poultry and fish, beans, nuts, tofu, etc.  For more information go to www. Thenutritionsource.org 5. Drink alcohol in moderation- Limit alcohol intake to one drink or less per day. Never drink and drive. 6. Depression- Your emotional health is as important as your physical health.  If you're feeling down or losing interest in things you normally enjoy please talk to your healthcare provider about being screened for depression. 7. Dental visit- Brush and floss your teeth twice daily; visit your dentist twice a year. 8. Eye doctor- Get an eye exam at least every 2 years. 9. Helmet use- Always wear a helmet when riding a bicycle, motorcycle, rollerblading or skateboarding. 40. Safe sex- If you may be exposed to sexually transmitted infections, use a condom. 11. Seat belts- Seat belts can save your live; always wear one. 12. Smoke/Carbon Monoxide detectors- These detectors need to be installed on the appropriate level of your home. Replace batteries at least once a year. 13. Skin cancer- When out in the sun please cover up and use sunscreen 15 SPF or higher. 14. Violence- If anyone is threatening or hurting you, please tell your healthcare provider.

## 2015-02-14 LAB — VITAMIN D 25 HYDROXY (VIT D DEFICIENCY, FRACTURES): VIT D 25 HYDROXY: 32 ng/mL (ref 30–100)

## 2015-02-14 LAB — TROPONIN I: Troponin I: <0.01 ng/mL (ref <0.06)

## 2015-03-25 ENCOUNTER — Ambulatory Visit (INDEPENDENT_AMBULATORY_CARE_PROVIDER_SITE_OTHER): Payer: Federal, State, Local not specified - PPO | Admitting: Physician Assistant

## 2015-03-25 VITALS — BP 110/84 | HR 89 | Temp 98.0°F | Resp 16 | Ht 60.5 in | Wt 195.6 lb

## 2015-03-25 DIAGNOSIS — T148 Other injury of unspecified body region: Secondary | ICD-10-CM | POA: Diagnosis not present

## 2015-03-25 DIAGNOSIS — T148XXA Other injury of unspecified body region, initial encounter: Secondary | ICD-10-CM

## 2015-03-25 DIAGNOSIS — R202 Paresthesia of skin: Secondary | ICD-10-CM

## 2015-03-25 NOTE — Progress Notes (Signed)
   Subjective:    Patient ID: Sherri Buckley, female    DOB: 1976-04-20, 38 y.o.   MRN: [id number]  Chief Complaint  Patient presents with  . Laceration    left leg on the calf--small laceration   Prior to Admission medications   Medication Sig Start Date End Date Taking? Authorizing Provider  albuterol (PROVENTIL HFA;VENTOLIN HFA) 108 (90 BASE) MCG/ACT inhaler Inhale 2 puffs into the lungs every 6 (six) hours as needed for wheezing or shortness of breath (cough, shortness of breath or wheezing.). 10/13/14  Yes Wardell Honour, MD  aspirin EC 81 MG EC tablet Take 1 tablet (81 mg total) by mouth daily. 12/22/14  Yes Almyra Deforest, PA  beclomethasone (QVAR) 40 MCG/ACT inhaler Inhale 2 puffs into the lungs 2 (two) times daily. 10/13/14  Yes Wardell Honour, MD  loratadine (CLARITIN) 10 MG tablet Take 10 mg by mouth daily.   Yes Historical Provider, MD  Multiple Vitamin (MULTIVITAMIN WITH MINERALS) TABS Take 1 tablet by mouth daily.   Yes Historical Provider, MD   Medications, allergies, past medical history, surgical history, family history, social history and problem list reviewed and updated.  HPI  41 yof presents with laceration left calf.   Occurred one wk ago. Was walking in living room and caught back of left calf on nail sticking out of chair. Sustained superficial laceration approx 6" long back of calf. Mild bleeding for first couple days. Has been applying neosporin daily.   Just had cpe and tdap on May 9th this year. Denies fevers or chills. She presents today out of concern that it might be affected.   She also mentions a diffuse tingling sensation entire body which has been present for her for several yrs. She has seen neuro. Has had 2 normal head MRIs. Looking through labs normal cmp, b12, mag, vit d, tsh recently. Tingling sensation has not changed or worsened. Just mentioning today.   Review of Systems See HPI.     Objective:   Physical Exam  Constitutional: She is oriented to  person, place, and time. She appears well-developed and well-nourished.  Non-toxic appearance. She does not have a sickly appearance. She does not appear ill. No distress.    BP 110/84 mmHg  Pulse 89  Temp(Src) 98 F (36.7 C) (Oral)  Resp 16  Ht 5' 0.5" (1.537 m)  Wt 195 lb 9.6 oz (88.724 kg)  BMI 37.56 kg/m2  SpO2 98%  LMP 03/05/2015   Neurological: She is alert and oriented to person, place, and time.  Skin:  Very superficial, healing laceration left calf approx 6" length. No surrounding erythema. No purulence. No induration or fluctuance. Wound has healed well with no bleeding or oozing.       Assessment & Plan:   2 yof presents with laceration left calf.   Abrasion --healing well, no signs infx, no needs for suturing today, tdap utd --rtc with signs of infx  Tingling sensation --stable --instructed to let us know if worsens or becomes more bothersome, can refer back to neuro  [name], PA-C Physician Assistant-Certified [facility]  03/26/2015 9:00 AM

## 2015-03-25 NOTE — Patient Instructions (Signed)
Your laceration looks to be healing well and does not look infected. Please let us know if you have fevers, redness, or worsening induration around the laceration. Please let us know if the tingling/prickling persists and is bothersome.

## 2015-03-26 ENCOUNTER — Encounter: Payer: Self-pay | Admitting: Physician Assistant

## 2015-04-04 ENCOUNTER — Ambulatory Visit: Payer: Federal, State, Local not specified - PPO | Admitting: Cardiology

## 2015-05-05 ENCOUNTER — Encounter: Payer: Self-pay | Admitting: Family Medicine

## 2015-05-05 ENCOUNTER — Ambulatory Visit (INDEPENDENT_AMBULATORY_CARE_PROVIDER_SITE_OTHER): Payer: Federal, State, Local not specified - PPO | Admitting: Family Medicine

## 2015-05-05 VITALS — BP 110/79 | HR 83 | Temp 98.6°F | Resp 16 | Ht 60.75 in | Wt 189.8 lb

## 2015-05-05 DIAGNOSIS — R202 Paresthesia of skin: Secondary | ICD-10-CM

## 2015-05-05 LAB — POCT URINALYSIS DIPSTICK
BILIRUBIN UA: NEGATIVE
GLUCOSE UA: NEGATIVE
KETONES UA: 15
LEUKOCYTES UA: NEGATIVE
Nitrite, UA: NEGATIVE
PROTEIN UA: NEGATIVE
RBC UA: NEGATIVE
Spec Grav, UA: 1.025
UROBILINOGEN UA: 0.2
pH, UA: 5.5

## 2015-05-05 LAB — CBC WITH DIFFERENTIAL/PLATELET
BASOS PCT: 0 % (ref 0–1)
Basophils Absolute: 0 10*3/uL (ref 0.0–0.1)
EOS ABS: 0.4 10*3/uL (ref 0.0–0.7)
Eosinophils Relative: 5 % (ref 0–5)
HCT: 40.8 % (ref 36.0–46.0)
Hemoglobin: 13.8 g/dL (ref 12.0–15.0)
LYMPHS ABS: 2 10*3/uL (ref 0.7–4.0)
Lymphocytes Relative: 25 % (ref 12–46)
MCH: 27.2 pg (ref 26.0–34.0)
MCHC: 33.8 g/dL (ref 30.0–36.0)
MCV: 80.3 fL (ref 78.0–100.0)
MONO ABS: 0.7 10*3/uL (ref 0.1–1.0)
MPV: 11.3 fL (ref 8.6–12.4)
Monocytes Relative: 9 % (ref 3–12)
Neutro Abs: 4.8 10*3/uL (ref 1.7–7.7)
Neutrophils Relative %: 61 % (ref 43–77)
Platelets: 384 10*3/uL (ref 150–400)
RBC: 5.08 MIL/uL (ref 3.87–5.11)
RDW: 14.2 % (ref 11.5–15.5)
WBC: 7.9 10*3/uL (ref 4.0–10.5)

## 2015-05-05 LAB — VITAMIN B12: Vitamin B-12: 1015 pg/mL — ABNORMAL HIGH (ref 211–911)

## 2015-05-05 LAB — HEMOGLOBIN A1C
Hgb A1c MFr Bld: 4.9 % (ref <5.7)
Mean Plasma Glucose: 94 mg/dL (ref <117)

## 2015-05-05 LAB — TSH: TSH: 3.119 u[IU]/mL (ref 0.350–4.500)

## 2015-05-05 LAB — FOLATE
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
## 2015-05-06 ENCOUNTER — Telehealth: Payer: Self-pay | Admitting: *Deleted

## 2015-05-06 LAB — COMPREHENSIVE METABOLIC PANEL
ALT: 15 U/L (ref 6–29)
AST: 18 U/L (ref 10–30)
Albumin: 3.9 g/dL (ref 3.6–5.1)
Alkaline Phosphatase: 50 U/L (ref 33–115)
BUN: 17 mg/dL (ref 7–25)
CHLORIDE: 103 mmol/L (ref 98–110)
CO2: 23 mmol/L (ref 20–31)
Calcium: 9.3 mg/dL (ref 8.6–10.2)
Creat: 0.77 mg/dL (ref 0.50–1.10)
Glucose, Bld: 102 mg/dL — ABNORMAL HIGH (ref 65–99)
POTASSIUM: 4.2 mmol/L (ref 3.5–5.3)
Sodium: 138 mmol/L (ref 135–146)
Total Bilirubin: 0.6 mg/dL (ref 0.2–1.2)
Total Protein: 7.2 g/dL (ref 6.1–8.1)

## 2015-05-06 LAB — VITAMIN D 25 HYDROXY (VIT D DEFICIENCY, FRACTURES): VIT D 25 HYDROXY: 35 ng/mL (ref 30–100)

## 2015-05-06 LAB — VITAMIN B1

## 2015-05-06 NOTE — Telephone Encounter (Signed)
[facility] called stating this pt had a Vitamin B1 done; however, it was drawn incorrectly.  They stated that it was suppose to be a frozen and it was sent in a regular SST tube.

## 2015-05-08 ENCOUNTER — Ambulatory Visit: Payer: Federal, State, Local not specified - PPO | Admitting: Family Medicine

## 2015-05-10 ENCOUNTER — Encounter: Payer: Self-pay | Admitting: Neurology

## 2015-05-10 ENCOUNTER — Ambulatory Visit (INDEPENDENT_AMBULATORY_CARE_PROVIDER_SITE_OTHER): Payer: Federal, State, Local not specified - PPO | Admitting: Neurology

## 2015-05-10 VITALS — BP 110/70 | HR 96 | Ht 60.75 in | Wt 194.1 lb

## 2015-05-10 DIAGNOSIS — R29810 Facial weakness: Secondary | ICD-10-CM | POA: Diagnosis not present

## 2015-05-10 DIAGNOSIS — R209 Unspecified disturbances of skin sensation: Secondary | ICD-10-CM

## 2015-05-10 DIAGNOSIS — R413 Other amnesia: Secondary | ICD-10-CM | POA: Diagnosis not present

## 2015-05-10 DIAGNOSIS — F1011 Alcohol abuse, in remission: Secondary | ICD-10-CM

## 2015-05-10 DIAGNOSIS — F1010 Alcohol abuse, uncomplicated: Secondary | ICD-10-CM | POA: Diagnosis not present

## 2015-05-10 NOTE — Progress Notes (Signed)
[facility] Clinic Note - Initial Visit   Date: 05/10/2015  Sherri Buckley MRN: [id number] DOB: 07-23-1976   Dear Dr. Tamala Julian:  Thank you for your kind referral of Sherri Buckley for consultation of whole body paresthesias. Although her history is well known to you, please allow [place] to reiterate it for the purpose of our medical record. The patient was accompanied to the clinic by self.   History of Present Illness: Sherri Buckley is a 39 y.o. right-handed African American female with asthma, seasonal allergies, and previous alcoholic dependence presenting for evaluation of numbness/tingling of the body.    Starting in 2014, she experienced numbness/tingling involving the entire body.  She had MRI brain which was normal and symptoms resolved without any intervention.  She was doing well until June 2016, when she started experiencing chilling and tingling sensation of the left calf and now has it over the entire face, especially left cheek.  She also feels pulsing sensation over sporadic location on her body.  Symptoms are all intermittent and does not have any identifiable triggers.    She has occasional headaches.  She mild intermittent pulsing of the right eye.  Denies vision changes, swallowing/talking difficulty, or weakness.  She is experiencing more memory and word-finding problems. She is very worried that she has multiple sclerosis.   She is stressed because she is single and tends to keep to herself.    She previously used to drink a pint of liquor during the weekdays and weekends for 15 years, which she quit in 2013.  She drinks occasionally on the weekends now, usually a bottle of wine.    Out-side paper records, electronic medical record, and images have been reviewed where available and summarized as:  MRI brain wo contrast 05/08/2013: 1. Extensive anterior paranasal sinusitis. 2. Normal MRI appearance of the brain.  MRI brain wo contrast  02/11/2013: Equivocal MRI brain (without) demonstrating: 1. Bilateral maxillary and ethmoid sinus disease. There is complete opacification of the left maxillary sinus with fluid and bubbly secretions, air fluid level in the right maxillary sinus, and inflammation and fluid ethmoid sinuses. 2. Otherwise normal brain parenchyma.  Component     Latest Ref Rng 05/05/2015  TSH     0.350 - 4.500 uIU/mL 3.119  Vitamin B-12     211 - 911 pg/mL 1015 (H)  Vit D, 25-Hydroxy     30 - 100 ng/mL 35     Past Medical History  Diagnosis Date  . Blood type, Rh negative   . Substance abuse   . Asthma   . Chronic bronchitis     "get it q yr"  . LNLGXQJJ(941.7)     "monthly" (12/21/2014)  . Osteoarthritis of ankle, right   . Seasonal allergies   . Allergy   . Depression 10/07/1998    Past Surgical History  Procedure Laterality Date  . Breast biopsy Right ~ 1993    "took out some benign tissue"     Medications:  Outpatient Encounter Prescriptions as of 05/10/2015  Medication Sig  . albuterol (PROVENTIL HFA;VENTOLIN HFA) 108 (90 BASE) MCG/ACT inhaler Inhale 2 puffs into the lungs every 6 (six) hours as needed for wheezing or shortness of breath (cough, shortness of breath or wheezing.).  Marland Kitchen aspirin EC 81 MG EC tablet Take 1 tablet (81 mg total) by mouth daily.  . beclomethasone (QVAR) 40 MCG/ACT inhaler Inhale 2 puffs into the lungs 2 (two) times daily.  Marland Kitchen loratadine (CLARITIN) 10  MG tablet Take 10 mg by mouth daily.  . Multiple Vitamin (MULTIVITAMIN WITH MINERALS) TABS Take 1 tablet by mouth daily.   Facility-Administered Encounter Medications as of 05/10/2015  Medication  . albuterol (PROVENTIL) (2.5 MG/3ML) 0.083% nebulizer solution 2.5 mg     Allergies: No Known Allergies  Family History: Family History  Problem Relation Age of Onset  . Hypertension Paternal Grandmother   . Diabetes Paternal Grandmother   . Heart disease Maternal Grandmother     9 bypass surgery  . Cancer Maternal  Grandmother     mouth  . Sickle cell trait Father   . Seizures Father   . Alcohol abuse Father   . Thyroid disease Mother     as a child  . Hypertension Mother   . Anemia Mother   . Migraines Mother   . Hernia Mother   . Diabetes Mother   . Arthritis Maternal Aunt     Social History: History  Substance Use Topics  . Smoking status: Former Research scientist (life sciences)  . Smokeless tobacco: Never Used  . Alcohol Use: 0.0 oz/week    0 Standard drinks or equivalent per week     Comment: 12/21/2014 "quit drinking in 2013"   History   Social History Narrative   Marital status: single; not dating      Children:  None      Lives: alone in house.      Employment: Actor x 11 years; clerk processing; happy; 3rd shift;       Tobacco:  None      Alcohol: rare alcohol; previous alcoholism; stopped drinking heavily 2014; previous counseling; went through EAP with work.      Drugs:  None; marijuana; rare use now.      Exercise:  None      Seatbelt:  100%      Guns:  None      Sexual activity:  Total partners = 25; history of crabs; dates males only.  Last STD screening 4 years ago.      Review of Systems:  CONSTITUTIONAL: No fevers, chills, night sweats, or weight loss.   EYES: No visual changes or eye pain ENT: No hearing changes.  No history of nose bleeds.   RESPIRATORY: No cough, wheezing and shortness of breath.   CARDIOVASCULAR: Negative for chest pain, and palpitations.   GI: Negative for abdominal discomfort, blood in stools or black stools.  No recent change in bowel habits.   GU:  No history of incontinence.   MUSCLOSKELETAL: No history of joint pain or swelling.  No myalgias.   SKIN: Negative for lesions, rash, and itching.   HEMATOLOGY/ONCOLOGY: Negative for prolonged bleeding, bruising easily, and swollen nodes.  No history of cancer.   ENDOCRINE: Negative for cold or heat intolerance, polydipsia or goiter.   PSYCH:  +depression or anxiety symptoms.   NEURO: As Above.   Vital  Signs:  BP 110/70 mmHg  Pulse 96  Ht 5' 0.75" (1.543 m)  Wt 194 lb 1 oz (88.026 kg)  BMI 36.97 kg/m2  SpO2 99%  LMP 04/25/2015   General Medical Exam:   General:  Well appearing, comfortable.   Eyes/ENT: see cranial nerve examination.   Neck: No masses appreciated.  Full range of motion without tenderness.  No carotid bruits. Respiratory:  Clear to auscultation, good air entry bilaterally.   Cardiac:  Regular rate and rhythm, no murmur.   Extremities:  No deformities, edema, or skin discoloration.  Skin:  No rashes  or lesions.  Neurological Exam: MENTAL STATUS including orientation to time, place, person, recent and remote memory, attention span and concentration, language, and fund of knowledge is normal.  Speech is not dysarthric.  Montreal Cognitive Assessment  05/10/2015  Visuospatial/ Executive (0/5) 4  Naming (0/3) 2  Attention: Read list of digits (0/2) 2  Attention: Read list of letters (0/1) 1  Attention: Serial 7 subtraction starting at 100 (0/3) 3  Language: Repeat phrase (0/2) 2  Language : Fluency (0/1) 1  Abstraction (0/2) 2  Delayed Recall (0/5) 0  Orientation (0/6) 6  Total 23  Adjusted Score (based on education) 24    CRANIAL NERVES: II:  No visual field defects.  Unremarkable fundi.   III-IV-VI: Pupils equal round and reactive to light.  Normal conjugate, extra-ocular eye movements in all directions of gaze.  No nystagmus.  No ptosis.   V:  Normal facial sensation.    VII:  Subtle flattening of the right nasolabial fold.  Otherwise, normal facial movements.  No pathologic facial reflexes.  VIII:  Normal hearing and vestibular function.   IX-X:  Normal palatal movement.   XI:  Normal shoulder shrug and head rotation.   XII:  Normal tongue strength and range of motion, no deviation or fasciculation.  MOTOR:  No atrophy, fasciculations or abnormal movements.  No pronator drift.  Tone is normal.    Right Upper Extremity:    Left Upper Extremity:    Deltoid   5/5   Deltoid  5/5   Biceps  5/5   Biceps  5/5   Triceps  5/5   Triceps  5/5   Wrist extensors  5/5   Wrist extensors  5/5   Wrist flexors  5/5   Wrist flexors  5/5   Finger extensors  5/5   Finger extensors  5/5   Finger flexors  5/5   Finger flexors  5/5   Dorsal interossei  5/5   Dorsal interossei  5/5   Abductor pollicis  5/5   Abductor pollicis  5/5   Tone (Ashworth scale)  0  Tone (Ashworth scale)  0   Right Lower Extremity:    Left Lower Extremity:    Hip flexors  5/5   Hip flexors  5/5   Hip extensors  5/5   Hip extensors  5/5   Knee flexors  5/5   Knee flexors  5/5   Knee extensors  5/5   Knee extensors  5/5   Dorsiflexors  5/5   Dorsiflexors  5/5   Plantarflexors  5/5   Plantarflexors  5/5   Toe extensors  5/5   Toe extensors  5/5   Toe flexors  5/5   Toe flexors  5/5   Tone (Ashworth scale)  0  Tone (Ashworth scale)  0   MSRs:  Right                                                                 Left brachioradialis 2+  brachioradialis 2+  biceps 2+  biceps 2+  triceps 2+  triceps 2+  patellar 2+  patellar 2+  ankle jerk 2+  ankle jerk 2+  Hoffman no  Hoffman no  plantar response down  plantar response down   SENSORY:  Normal and symmetric perception of light touch, pinprick, vibration, and proprioception.  Romberg's sign absent.   COORDINATION/GAIT: Normal finger-to- nose-finger and heel-to-shin.  Intact rapid alternating movements bilaterally.  Able to rise from a chair without using arms.  Gait narrow based and stable. Tandem and stressed gait intact.    IMPRESSION: Ms. Nies is a 39 year-old female with history of alcohol abuse presenting for evaluation of whole-body paresthesias.  Her exam does not show any evidence of cranial nerve dysfunction or motor/sensory deficits. She does have cognitive impairment, which I suspect is stemming from underlying mood disorder. I have personally reviewed her non-contrasted MRI brain from 2014 which is normal. With the  fluctuating nature of her symptoms, I reassured her that my suspicion for a worrisome neurological condition is low.  She is most concerned about multiple sclerosis, so I will check MRI brain with and without contrast.  If the work-up returns normal, symptoms are most likely manifestation of stress/anxiety, which she does endorse.  I had a lengthy conversation discussing options for stress management, including staying active, findings new hobbies, and staying socially engaged.  Further, I discouraged her from drinking in isolation given her history of abuse in the past.  Return to clinic as needed   The duration of this appointment visit was 60 minutes of face-to-face time with the patient.  Greater than 50% of this time was spent in counseling, explanation of diagnosis, planning of further management, and coordination of care.   Thank you for allowing me to participate in patient's care.  If I can answer any additional questions, I would be pleased to do so.    Sincerely,    Donika K. Posey Pronto, DO

## 2015-05-10 NOTE — Patient Instructions (Addendum)
1.  MRI brain wwo contrast.  We will call you with the results 2.  Stay active and discover new hobbies. Find things that make you happy.  A lot of your symptoms may be stemming from stress/anxiety. 3.  I am extremely proud of you for quitting alcohol to the extent you were previously consuming, but also do try to limit drinking alone so you don't fall into the bad habit again. 4.  Return to clinic as needed

## 2015-05-17 ENCOUNTER — Other Ambulatory Visit: Payer: Federal, State, Local not specified - PPO

## 2015-05-29 ENCOUNTER — Ambulatory Visit
Admission: RE | Admit: 2015-05-29 | Discharge: 2015-05-29 | Disposition: A | Discharge status: Home / Self Care | Payer: Federal, State, Local not specified - PPO | Source: Ambulatory Visit | Attending: Neurology | Admitting: Neurology

## 2015-05-29 DIAGNOSIS — R29810 Facial weakness: Secondary | ICD-10-CM

## 2015-05-29 DIAGNOSIS — R209 Unspecified disturbances of skin sensation: Secondary | ICD-10-CM

## 2015-05-29 DIAGNOSIS — F1011 Alcohol abuse, in remission: Secondary | ICD-10-CM

## 2015-05-29 DIAGNOSIS — R413 Other amnesia: Secondary | ICD-10-CM

## 2015-05-29 MED ORDER — GADOBENATE DIMEGLUMINE 529 MG/ML IV SOLN
18.0000 mL | Freq: Once | INTRAVENOUS | Status: AC | PRN
Start: 2015-05-29 — End: 2015-05-29
  Administered 2015-05-29: 18 mL via INTRAVENOUS

## 2015-06-01 ENCOUNTER — Telehealth: Payer: Self-pay | Admitting: Neurology

## 2015-06-01 NOTE — Telephone Encounter (Signed)
Pt returned your [phone]

## 2015-06-02 ENCOUNTER — Telehealth: Payer: Self-pay | Admitting: Neurology

## 2015-06-02 NOTE — Telephone Encounter (Signed)
Pt returned your call/Dawn CB# [phone]

## 2015-06-09 ENCOUNTER — Telehealth: Payer: Self-pay | Admitting: Neurology

## 2015-06-09 NOTE — Telephone Encounter (Signed)
I see no mention of ENT referral in Dr. Serita Grit notes.  Recommend that she call the office back when Dr. Posey Pronto is available.

## 2015-06-09 NOTE — Telephone Encounter (Signed)
Pt called concerning a referral to an ENT Dr/ Call back @ [phone]

## 2015-06-15 ENCOUNTER — Ambulatory Visit: Payer: Federal, State, Local not specified - PPO | Admitting: Neurology

## 2015-08-17 ENCOUNTER — Other Ambulatory Visit: Payer: Self-pay | Admitting: Obstetrics and Gynecology

## 2015-08-17 DIAGNOSIS — N6452 Nipple discharge: Secondary | ICD-10-CM

## 2015-08-24 ENCOUNTER — Ambulatory Visit
Admission: RE | Admit: 2015-08-24 | Discharge: 2015-08-24 | Disposition: A | Discharge status: Home / Self Care | Payer: Federal, State, Local not specified - PPO | Source: Ambulatory Visit | Attending: Obstetrics and Gynecology | Admitting: Obstetrics and Gynecology

## 2015-08-24 DIAGNOSIS — N6452 Nipple discharge: Secondary | ICD-10-CM

## 2015-09-14 ENCOUNTER — Ambulatory Visit (INDEPENDENT_AMBULATORY_CARE_PROVIDER_SITE_OTHER): Payer: Federal, State, Local not specified - PPO | Admitting: Emergency Medicine

## 2015-09-14 VITALS — BP 140/88 | HR 69 | Temp 98.3°F | Resp 16 | Ht 61.5 in | Wt 203.2 lb

## 2015-09-14 DIAGNOSIS — J209 Acute bronchitis, unspecified: Secondary | ICD-10-CM

## 2015-09-14 DIAGNOSIS — J4531 Mild persistent asthma with (acute) exacerbation: Secondary | ICD-10-CM | POA: Diagnosis not present

## 2015-09-14 MED ORDER — IPRATROPIUM BROMIDE 0.02 % IN SOLN
0.5000 mg | Freq: Once | RESPIRATORY_TRACT | Status: AC
  Administered 2015-09-14: 0.5 mg via RESPIRATORY_TRACT

## 2015-09-14 MED ORDER — AZITHROMYCIN 250 MG PO TABS: ORAL_TABLET | ORAL | Status: DC

## 2015-09-14 MED ORDER — ALBUTEROL SULFATE (2.5 MG/3ML) 0.083% IN NEBU
5.0000 mg | INHALATION_SOLUTION | Freq: Once | RESPIRATORY_TRACT | Status: AC
  Administered 2015-09-14: 5 mg via RESPIRATORY_TRACT

## 2015-09-14 NOTE — Progress Notes (Signed)
Subjective:  Patient ID: Sherri Buckley, female    DOB: 1976/08/10  Age: 39 y.o. MRN: [id number]  CC: Cough and Wheezing   HPI Sherri Buckley presents   Patient patient is a several day history of nasal congestion postnasal drainage and nasal discharge is  Clear in colorcollar. She has fatigue and cough productive of purulent sputum associated wheezing. No shortness of breath.  History Sherri Buckley has a past medical history of Blood type, Rh negative; Substance abuse; Asthma; Chronic bronchitis ([place]); Headache(784.0); Osteoarthritis of ankle, right; Seasonal allergies; Allergy; and Depression (10/07/1998).   She has past surgical history that includes Breast biopsy (Right, ~ 1993).   Her  family history includes Alcohol abuse in her father; Anemia in her mother; Arthritis in her maternal aunt; Cancer in her maternal grandmother; Diabetes in her mother and paternal grandmother; Heart disease in her maternal grandmother; Hernia in her mother; Hypertension in her mother and paternal grandmother; Migraines in her mother; Seizures in her father; Sickle cell trait in her father; Thyroid disease in her mother.  She   reports that she has quit smoking. She has never used smokeless tobacco. She reports that she drinks alcohol. She reports that she uses illicit drugs (Marijuana).  Outpatient Prescriptions Prior to Visit  Medication Sig Dispense Refill  . albuterol (PROVENTIL HFA;VENTOLIN HFA) 108 (90 BASE) MCG/ACT inhaler Inhale 2 puffs into the lungs every 6 (six) hours as needed for wheezing or shortness of breath (cough, shortness of breath or wheezing.). 1 Inhaler 2  . beclomethasone (QVAR) 40 MCG/ACT inhaler Inhale 2 puffs into the lungs 2 (two) times daily. 1 Inhaler 12  . Multiple Vitamin (MULTIVITAMIN WITH MINERALS) TABS Take 1 tablet by mouth daily.    Marland Kitchen aspirin EC 81 MG EC tablet Take 1 tablet (81 mg total) by mouth daily. (Patient not taking: Reported on 09/14/2015)    . loratadine  (CLARITIN) 10 MG tablet Take 10 mg by mouth daily.     Facility-Administered Medications Prior to Visit  Medication Dose Route Frequency Provider Last Rate Last Dose  . albuterol (PROVENTIL) (2.5 MG/3ML) 0.083% nebulizer solution 2.5 mg  2.5 mg Nebulization Once Sherri Honour, MD        Social History   Social History  . Marital Status: Single    Spouse Name: N/A  . Number of Children: N/A  . Years of Education: N/A   Occupational History  .  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 Smoking status: Former Research scientist (life sciences)  . Smokeless tobacco: Never Used  . Alcohol Use: 0.0 oz/week    0 Standard drinks or equivalent per week     Comment: 12/21/2014 "quit drinking in 2013"  . Drug Use: Yes    Special: Marijuana     Comment: "quit smoking marijuana in 2013"  . Sexual Activity: No   Other Topics Concern  . None   Social History Narrative   Marital status: single; not dating      Children:  None      Lives: alone in house.      Employment: Actor x 11 years; clerk processing; happy; 3rd shift;       Tobacco:  None      Alcohol: rare alcohol; previous alcoholism; stopped drinking heavily 2014; previous counseling; went through EAP with work.      Drugs:  None; marijuana; rare use now.      Exercise:  None  Seatbelt:  100%      Guns:  None      Sexual activity:  Total partners = 25; history of crabs; dates males only.  Last STD screening 4 years ago.       Review of Systems  Constitutional: Positive for fatigue. Negative for fever, chills and appetite change.  HENT: Positive for congestion, postnasal drip and rhinorrhea. Negative for ear pain, sinus pressure and sore throat.   Eyes: Negative for pain and redness.  Respiratory: Positive for cough and wheezing. Negative for shortness of breath.   Cardiovascular: Negative for leg swelling.  Gastrointestinal: Negative for nausea, vomiting, abdominal pain, diarrhea, constipation and blood in  stool.  Endocrine: Negative for polyuria.  Genitourinary: Negative for dysuria, urgency, frequency and flank pain.  Musculoskeletal: Negative for gait problem.  Skin: Negative for rash.  Neurological: Negative for weakness and headaches.  Psychiatric/Behavioral: Negative for confusion and decreased concentration. The patient is not nervous/anxious.     Objective:  BP 140/88 mmHg  Pulse 69  Temp(Src) 98.3 F (36.8 C) (Oral)  Resp 16  Ht 5' 1.5" (1.562 m)  Wt 203 lb 3.2 oz (92.171 kg)  BMI 37.78 kg/m2  SpO2 96%  LMP 08/29/2015  Physical Exam  Constitutional: She is oriented to person, place, and time. She appears well-developed and well-nourished. No distress.  HENT:  Head: Normocephalic and atraumatic.  Right Ear: External ear normal.  Left Ear: External ear normal.  Nose: Nose normal.  Eyes: Conjunctivae and EOM are normal. Pupils are equal, round, and reactive to light. No scleral icterus.  Neck: Normal range of motion. Neck supple. No tracheal deviation present.  Cardiovascular: Normal rate, regular rhythm and normal heart sounds.   Pulmonary/Chest: Effort normal. No respiratory distress. She has no wheezes. She has no rales.  Abdominal: She exhibits no mass. There is no tenderness. There is no rebound and no guarding.  Musculoskeletal: She exhibits no edema.  Lymphadenopathy:    She has no cervical adenopathy.  Neurological: She is alert and oriented to person, place, and time. Coordination normal.  Skin: Skin is warm and dry. No rash noted.  Psychiatric: She has a normal mood and affect. Her behavior is normal.      Assessment & Plan:   Sherri Buckley was seen today for cough and wheezing.  Diagnoses and all orders for this visit:  Asthma with acute exacerbation, mild persistent -     albuterol (PROVENTIL) (2.5 MG/3ML) 0.083% nebulizer solution 5 mg; Take 6 mLs (5 mg total) by nebulization once. -     ipratropium (ATROVENT) nebulizer solution 0.5 mg; Take 2.5 mLs (0.5  mg total) by nebulization once.  Acute bronchitis, unspecified organism -     albuterol (PROVENTIL) (2.5 MG/3ML) 0.083% nebulizer solution 5 mg; Take 6 mLs (5 mg total) by nebulization once. -     ipratropium (ATROVENT) nebulizer solution 0.5 mg; Take 2.5 mLs (0.5 mg total) by nebulization once.  Other orders -     azithromycin (ZITHROMAX) 250 MG tablet; Take 2 tabs PO x 1 dose, then 1 tab PO QD x 4 days   I am having Ms. Markus start on azithromycin. I am also having her maintain her multivitamin with minerals, beclomethasone, albuterol, aspirin, and loratadine. We administered albuterol and ipratropium. We will continue to administer albuterol.  Meds ordered this encounter  Medications  . albuterol (PROVENTIL) (2.5 MG/3ML) 0.083% nebulizer solution 5 mg    Sig:   . ipratropium (ATROVENT) nebulizer solution 0.5 mg  Sig:   . azithromycin (ZITHROMAX) 250 MG tablet    Sig: Take 2 tabs PO x 1 dose, then 1 tab PO QD x 4 days    Dispense:  6 tablet    Refill:  0    Appropriate red flag conditions were discussed with the patient as well as actions that should be taken.  Patient expressed his understanding.  Follow-up: Return if symptoms worsen or fail to improve.  Roselee Culver, MD

## 2015-09-14 NOTE — Patient Instructions (Signed)

## 2015-09-27 ENCOUNTER — Telehealth: Payer: Self-pay | Admitting: *Deleted

## 2015-09-27 NOTE — Telephone Encounter (Signed)
Patient called requesting ENT referral.  Informed her that I will send referral and someone should contact her with an appointment.

## 2015-10-25 ENCOUNTER — Encounter: Payer: Self-pay | Admitting: Family Medicine

## 2015-10-25 ENCOUNTER — Ambulatory Visit (INDEPENDENT_AMBULATORY_CARE_PROVIDER_SITE_OTHER): Payer: Federal, State, Local not specified - PPO | Admitting: Family Medicine

## 2015-10-25 VITALS — BP 112/77 | HR 93 | Temp 98.1°F | Resp 16 | Ht 61.0 in | Wt 200.8 lb

## 2015-10-25 DIAGNOSIS — J329 Chronic sinusitis, unspecified: Secondary | ICD-10-CM

## 2015-10-25 DIAGNOSIS — H00011 Hordeolum externum right upper eyelid: Secondary | ICD-10-CM | POA: Diagnosis not present

## 2015-10-25 DIAGNOSIS — J309 Allergic rhinitis, unspecified: Secondary | ICD-10-CM

## 2015-10-25 MED ORDER — FLUTICASONE PROPIONATE 50 MCG/ACT NA SUSP: 2.0000 | Freq: Every day | NASAL | Status: DC

## 2015-10-25 NOTE — Progress Notes (Signed)
Subjective:    Patient ID: Sherri Buckley, female    DOB: 16-Dec-1975, 40 y.o.   MRN: [id number]  HPI This is a pleasant 40 yo female who presents today with 1 month of eye twitching that has since resolved. She noticed some swelling of her right eyelid over the last 2 days. The swelling is better today. She noticed some pain with blinking. No sensation of foreign body. No blurred vision, no light sensitivity.   She has a longstanding history of asthma, allergic rhinitis and was found to have chronic sinusitis on MRI. Her neurologist suggested she get a referral to ENT regarding her chronic sinusitis. She has good control of her asthma on her BID Qvar. She rarely needs albuterol inhaler. No cough, wheeze or SOB. She has daily nasal congestion and drainage. She thinks this is made worse from her dusty work place (she works at [employer] which is very dusty). She has never had allergy testing. She takes occasional Claritin, but not regularly. She was prescribed a nasal spray in the past, but she did not use.   Past Medical History  Diagnosis Date  . Blood type, Rh negative   . Substance abuse   . Asthma   . Chronic bronchitis ([place])     "get it q yr"  . [id number])     "monthly" (12/21/2014)  . Osteoarthritis of ankle, right   . Seasonal allergies   . Allergy   . Depression 10/07/1998   Past Surgical History  Procedure Laterality Date  . Breast biopsy Right ~ 1993    "took out some benign tissue"   Family History  Problem Relation Age of Onset  . Hypertension Paternal Grandmother   . Diabetes Paternal Grandmother   . Heart disease Maternal Grandmother     9 bypass surgery  . Cancer Maternal Grandmother     mouth  . Sickle cell trait Father   . Seizures Father   . Alcohol abuse Father   . Thyroid disease Mother     as a child  . Hypertension Mother   . Anemia Mother   . Migraines Mother   . Hernia Mother   . Diabetes Mother   . Arthritis Maternal Aunt    Social  History  Substance Use Topics  . Smoking status: Former Research scientist (life sciences)  . Smokeless tobacco: Never Used  . Alcohol Use: 0.0 oz/week    0 Standard drinks or equivalent per week     Comment: 12/21/2014 "quit drinking in 2013"   Medications, allergies, past medical history, surgical history, family history, social history and problem list reviewed and updated.  Review of Systems Per HPI    Objective:   Physical Exam  Constitutional: She is oriented to person, place, and time. She appears well-developed and well-nourished.  HENT:  Head: Normocephalic and atraumatic.  Right Ear: Tympanic membrane, external ear and ear canal normal.  Left Ear: Tympanic membrane, external ear and ear canal normal.  Nose: Mucosal edema and rhinorrhea present. Right sinus exhibits no maxillary sinus tenderness and no frontal sinus tenderness. Left sinus exhibits no maxillary sinus tenderness and no frontal sinus tenderness.  Mouth/Throat: Uvula is midline and oropharynx is clear and moist.  Eyes: Conjunctivae and EOM are normal. Pupils are equal, round, and reactive to light. Right eye exhibits hordeolum. Right eye exhibits no discharge. Left eye exhibits no discharge.    Cardiovascular: Normal rate, regular rhythm and normal heart sounds.   Musculoskeletal: Normal range of motion.  Neurological: She  is alert and oriented to person, place, and time.  Vitals reviewed.   BP 112/77 mmHg  Pulse 93  Temp(Src) 98.1 F (36.7 C) (Oral)  Resp 16  Ht 5\' 1"  (1.549 m)  Wt 200 lb 12.8 oz (91.082 kg)  BMI 37.96 kg/m2  SpO2 95%  LMP 10/17/2015     Assessment & Plan:  1. Chronic sinusitis, unspecified location - Ambulatory referral to ENT  2. Allergic rhinitis, unspecified allergic rhinitis type - Provided written and verbal information regarding diagnosis and treatment. - encouraged daily antihistamine use - fluticasone (FLONASE) 50 MCG/ACT nasal spray; Place 2 sprays into both nostrils daily.  Dispense: 16 g;  Refill: 6  3. Hordeolum externum of right upper eyelid -Provided written and verbal information regarding diagnosis and treatment. - warm compresses several times a day - RTC if worsening symptoms, visual changes, eye discharege   Clarene Reamer, FNP-BC  [facility]  10/27/2015 3:11 PM

## 2015-10-25 NOTE — Patient Instructions (Signed)
Please get some generic afrin and use for 3 nights before using the flonase Use the flonase at night for a month. Two sprays in each nostril (point the tip toward your ear).   Allergic Rhinitis Allergic rhinitis is when the mucous membranes in the nose respond to allergens. Allergens are particles in the air that cause your body to have an allergic reaction. This causes you to release allergic antibodies. Through a chain of events, these eventually cause you to release histamine into the blood stream. Although meant to protect the body, it is this release of histamine that causes your discomfort, such as frequent sneezing, congestion, and an itchy, runny nose.  CAUSES Seasonal allergic rhinitis (hay fever) is caused by pollen allergens that may come from grasses, trees, and weeds. Year-round allergic rhinitis (perennial allergic rhinitis) is caused by allergens such as house dust mites, pet dander, and mold spores. SYMPTOMS  Nasal stuffiness (congestion).  Itchy, runny nose with sneezing and tearing of the eyes. DIAGNOSIS Your health care provider can help you determine the allergen or allergens that trigger your symptoms. If you and your health care provider are unable to determine the allergen, skin or blood testing may be used. Your health care provider will diagnose your condition after taking your health history and performing a physical exam. Your health care provider may assess you for other related conditions, such as asthma, pink eye, or an ear infection. TREATMENT Allergic rhinitis does not have a cure, but it can be controlled by:  Medicines that block allergy symptoms. These may include allergy shots, nasal sprays, and oral antihistamines.  Avoiding the allergen. Hay fever may often be treated with antihistamines in pill or nasal spray forms. Antihistamines block the effects of histamine. There are over-the-counter medicines that may help with nasal congestion and swelling around the  eyes. Check with your health care provider before taking or giving this medicine. If avoiding the allergen or the medicine prescribed do not work, there are many new medicines your health care provider can prescribe. Stronger medicine may be used if initial measures are ineffective. Desensitizing injections can be used if medicine and avoidance does not work. Desensitization is when a patient is given ongoing shots until the body becomes less sensitive to the allergen. Make sure you follow up with your health care provider if problems continue. HOME CARE INSTRUCTIONS It is not possible to completely avoid allergens, but you can reduce your symptoms by taking steps to limit your exposure to them. It helps to know exactly what you are allergic to so that you can avoid your specific triggers. SEEK MEDICAL CARE IF:  You have a fever.  You develop a cough that does not stop easily (persistent).  You have shortness of breath.  You start wheezing.  Symptoms interfere with normal daily activities.   This information is not intended to replace advice given to you by your health care provider. Make sure you discuss any questions you have with your health care provider.   Document Released: 06/18/2001 Document Revised: 10/14/2014 Document Reviewed: 05/31/2013 Elsevier Interactive Patient Education 2016 [place] is a bump on your eyelid caused by a bacterial infection. A stye can form inside the eyelid (internal stye) or outside the eyelid (external stye). An internal stye may be caused by an infected oil-producing gland inside your eyelid. An external stye may be caused by an infection at the base of your eyelash (hair follicle). Styes are very common. Anyone can  get them at any age. They usually occur in just one eye, but you may have more than one in either eye.  CAUSES  The infection is almost always caused by bacteria called Staphylococcus aureus. This is a common type of  bacteria that lives on your skin. RISK FACTORS You may be at higher risk for a stye if you have had one before. You may also be at higher risk if you have:  Diabetes.  Long-term illness.  Long-term eye redness.  A skin condition called seborrhea.  High fat levels in your blood (lipids). SIGNS AND SYMPTOMS  Eyelid pain is the most common symptom of a stye. Internal styes are more painful than external styes. Other signs and symptoms may include:  Painful swelling of your eyelid.  A scratchy feeling in your eye.  Tearing and redness of your eye.  Pus draining from the stye. DIAGNOSIS  Your health care provider may be able to diagnose a stye just by examining your eye. The health care provider may also check to make sure:  You do not have a fever or other signs of a more serious infection.  The infection has not spread to other parts of your eye or areas around your eye. TREATMENT  Most styes will clear up in a few days without treatment. In some cases, you may need to use antibiotic drops or ointment to prevent infection. Your health care provider may have to drain the stye surgically if your stye is:  Large.  Causing a lot of pain.  Interfering with your vision. This can be done using a thin blade or a needle.  HOME CARE INSTRUCTIONS   Take medicines only as directed by your health care provider.  Apply a clean, warm compress to your eye for 10 minutes, 4 times a day.  Do not wear contact lenses or eye makeup until your stye has healed.  Do not try to pop or drain the stye. SEEK MEDICAL CARE IF:  You have chills or a fever.  Your stye does not go away after several days.  Your stye affects your vision.  Your eyeball becomes swollen, red, or painful. MAKE SURE YOU:  Understand these instructions.  Will watch your condition.  Will get help right away if you are not doing well or get worse.   This information is not intended to replace advice given to you  by your health care provider. Make sure you discuss any questions you have with your health care provider.   Document Released: 07/03/2005 Document Revised: 10/14/2014 Document Reviewed: 01/07/2014 Elsevier Interactive Patient Education [employer].

## 2015-12-12 ENCOUNTER — Encounter (HOSPITAL_COMMUNITY): Payer: Self-pay | Admitting: Cardiology

## 2015-12-12 ENCOUNTER — Emergency Department (HOSPITAL_COMMUNITY)
Admission: EM | Admit: 2015-12-12 | Discharge: 2015-12-12 | Disposition: A | Discharge status: Home / Self Care | Payer: Federal, State, Local not specified - PPO | Attending: Emergency Medicine | Admitting: Emergency Medicine

## 2015-12-12 ENCOUNTER — Emergency Department (HOSPITAL_COMMUNITY): Payer: Federal, State, Local not specified - PPO

## 2015-12-12 DIAGNOSIS — Z8659 Personal history of other mental and behavioral disorders: Secondary | ICD-10-CM | POA: Diagnosis not present

## 2015-12-12 DIAGNOSIS — Z7951 Long term (current) use of inhaled steroids: Secondary | ICD-10-CM | POA: Diagnosis not present

## 2015-12-12 DIAGNOSIS — Z87891 Personal history of nicotine dependence: Secondary | ICD-10-CM | POA: Insufficient documentation

## 2015-12-12 DIAGNOSIS — J45909 Unspecified asthma, uncomplicated: Secondary | ICD-10-CM | POA: Insufficient documentation

## 2015-12-12 DIAGNOSIS — R002 Palpitations: Secondary | ICD-10-CM | POA: Diagnosis not present

## 2015-12-12 DIAGNOSIS — Z79899 Other long term (current) drug therapy: Secondary | ICD-10-CM | POA: Diagnosis not present

## 2015-12-12 DIAGNOSIS — Z8739 Personal history of other diseases of the musculoskeletal system and connective tissue: Secondary | ICD-10-CM | POA: Diagnosis not present

## 2015-12-12 DIAGNOSIS — R079 Chest pain, unspecified: Secondary | ICD-10-CM | POA: Diagnosis present

## 2015-12-12 LAB — CBC
HEMATOCRIT: 38.3 % (ref 36.0–46.0)
HEMOGLOBIN: 13.5 g/dL (ref 12.0–15.0)
MCH: 26.6 pg (ref 26.0–34.0)
MCHC: 35.2 g/dL (ref 30.0–36.0)
MCV: 75.5 fL — AB (ref 78.0–100.0)
PLATELETS: 351 10*3/uL (ref 150–400)
RBC: 5.07 MIL/uL (ref 3.87–5.11)
RDW: 13.8 % (ref 11.5–15.5)
WBC: 6.6 10*3/uL (ref 4.0–10.5)

## 2015-12-12 LAB — I-STAT TROPONIN, ED
TROPONIN I, POC: 0.01 ng/mL (ref 0.00–0.08)
Troponin i, poc: 0 ng/mL (ref 0.00–0.08)

## 2015-12-12 LAB — BASIC METABOLIC PANEL
ANION GAP: 9 (ref 5–15)
BUN: 10 mg/dL (ref 6–20)
CHLORIDE: 106 mmol/L (ref 101–111)
CO2: 24 mmol/L (ref 22–32)
CREATININE: 0.85 mg/dL (ref 0.44–1.00)
Calcium: 9.1 mg/dL (ref 8.9–10.3)
GFR calc non Af Amer: >60 mL/min (ref >60)
Glucose, Bld: 106 mg/dL — ABNORMAL HIGH (ref 65–99)
POTASSIUM: 4.1 mmol/L (ref 3.5–5.1)
SODIUM: 139 mmol/L (ref 135–145)

## 2015-12-12 NOTE — ED Provider Notes (Signed)
CSN: [id number]     Arrival date & time 12/12/15  1045 History   First MD Initiated Contact with Patient 12/12/15 1831     Chief Complaint  Patient presents with  . Chest Pain   Patient is a 40 y.o. female presenting with palpitations.  Palpitations Palpitations quality:  Unable to specify Onset quality:  Sudden Duration:  1 day Timing:  Intermittent Progression:  Partially resolved Chronicity:  Recurrent Context: not anxiety, not caffeine, not dehydration and not illicit drugs   Relieved by:  None tried Worsened by:  Nothing Ineffective treatments:  None tried Associated symptoms: no back pain, no chest pain, no chest pressure, no cough, no dizziness, no nausea, no numbness, no shortness of breath, no syncope, no vomiting and no weakness   Risk factors: no diabetes mellitus and no hx of atrial fibrillation     Past Medical History  Diagnosis Date  . Blood type, Rh negative   . Substance abuse   . Asthma   . Chronic bronchitis ([facility])     "get it q yr"  . [id number]     "monthly" (12/21/2014)  . Osteoarthritis of ankle, right   . Seasonal allergies   . Allergy   . Depression 10/07/1998   Past Surgical History  Procedure Laterality Date  . Breast biopsy Right ~ 1993    "took out some benign tissue"   Family History  Problem Relation Age of Onset  . Hypertension Paternal Grandmother   . Diabetes Paternal Grandmother   . Heart disease Maternal Grandmother     9 bypass surgery  . Cancer Maternal Grandmother     mouth  . Sickle cell trait Father   . Seizures Father   . Alcohol abuse Father   . Thyroid disease Mother     as a child  . Hypertension Mother   . Anemia Mother   . Migraines Mother   . Hernia Mother   . Diabetes Mother   . Arthritis Maternal Aunt    Social History  Substance Use Topics  . Smoking status: Former Research scientist (life sciences)  . Smokeless tobacco: Never Used  . Alcohol Use: 0.0 oz/week    0 Standard drinks or equivalent per week     Comment: 12/21/2014  "quit drinking in 2013"   OB History    Gravida Para Term Preterm AB TAB SAB Ectopic Multiple Living   9 0   8  1   0     Review of Systems  Constitutional: Negative for fever, chills, activity change and appetite change.  HENT: Negative for congestion, dental problem, ear pain, facial swelling, hearing loss, rhinorrhea, sneezing, sore throat, trouble swallowing and voice change.   Eyes: Negative for photophobia, pain, redness and visual disturbance.  Respiratory: Negative for apnea, cough, chest tightness, shortness of breath, wheezing and stridor.   Cardiovascular: Positive for palpitations. Negative for chest pain, leg swelling and syncope.  Gastrointestinal: Negative for nausea, vomiting, abdominal pain, diarrhea, constipation, blood in stool and abdominal distention.  Endocrine: Negative for polydipsia and polyuria.  Genitourinary: Negative for frequency, hematuria, flank pain, decreased urine volume and difficulty urinating.  Musculoskeletal: Negative for back pain, joint swelling, gait problem, neck pain and neck stiffness.  Skin: Negative for rash and wound.  Allergic/Immunologic: Negative for immunocompromised state.  Neurological: Negative for dizziness, syncope, facial asymmetry, speech difficulty, weakness, light-headedness, numbness and headaches.  Hematological: Negative for adenopathy.  Psychiatric/Behavioral: Negative for suicidal ideas, behavioral problems, confusion, sleep disturbance and agitation. The patient is not  nervous/anxious.   All other systems reviewed and are negative.     Allergies  Review of patient's allergies indicates no known allergies.  Home Medications   Prior to Admission medications   Medication Sig Start Date End Date Taking? Authorizing Provider  albuterol (PROVENTIL HFA;VENTOLIN HFA) 108 (90 BASE) MCG/ACT inhaler Inhale 2 puffs into the lungs every 6 (six) hours as needed for wheezing or shortness of breath (cough, shortness of breath or  wheezing.). 10/13/14  Yes Wardell Honour, MD  beclomethasone (QVAR) 40 MCG/ACT inhaler Inhale 2 puffs into the lungs 2 (two) times daily. 10/13/14  Yes Wardell Honour, MD  fluticasone (FLONASE) 50 MCG/ACT nasal spray Place 2 sprays into both nostrils daily. 10/25/15  Yes Elby Beck, FNP  loratadine (CLARITIN) 10 MG tablet Take 10 mg by mouth daily. Reported on 10/25/2015   Yes Historical Provider, MD  Multiple Vitamin (MULTIVITAMIN WITH MINERALS) TABS Take 1 tablet by mouth daily.   Yes Historical Provider, MD   BP 119/81 mmHg  Pulse 78  Temp(Src) 98.1 F (36.7 C) (Oral)  Resp 16  Wt 90.719 kg  SpO2 97%  LMP 12/05/2015 Physical Exam  Constitutional: She is oriented to person, place, and time. She appears well-developed and well-nourished. No distress.  HENT:  Head: Normocephalic and atraumatic.  Right Ear: External ear normal.  Left Ear: External ear normal.  Eyes: Pupils are equal, round, and reactive to light. Right eye exhibits no discharge. Left eye exhibits no discharge.  Neck: Normal range of motion. No JVD present. No tracheal deviation present.  Cardiovascular: Normal rate, regular rhythm and normal heart sounds.  Exam reveals no friction rub.   No murmur heard. Pulmonary/Chest: Effort normal and breath sounds normal. No stridor. No respiratory distress. She has no wheezes.  Abdominal: Soft. Bowel sounds are normal. She exhibits no distension. There is no rebound and no guarding.  Musculoskeletal: Normal range of motion. She exhibits no edema or tenderness.  Lymphadenopathy:    She has no cervical adenopathy.  Neurological: She is alert and oriented to person, place, and time. No cranial nerve deficit. Coordination normal.  Skin: Skin is warm and dry. No rash noted. No pallor.  Psychiatric: She has a normal mood and affect. Her behavior is normal. Judgment and thought content normal.  Nursing note and vitals reviewed.   ED Course  Procedures (including critical care  time) Labs Review Labs Reviewed  BASIC METABOLIC PANEL - Abnormal; Notable for the following:    Glucose, Bld 106 (*)    All other components within normal limits  CBC - Abnormal; Notable for the following:    MCV 75.5 (*)    All other components within normal limits  I-STAT TROPOININ, ED  Randolm Idol, ED    Imaging Review Dg Chest 2 View  12/12/2015  CLINICAL DATA:  Cardiac flutter EXAM: CHEST  2 VIEW COMPARISON:  10/13/2014 FINDINGS: Normal heart size. Lungs clear. No pneumothorax. No pleural effusion. IMPRESSION: No active cardiopulmonary disease. Electronically Signed   By: Marybelle Killings M.D.   On: 12/12/2015 11:47   I have personally reviewed and evaluated these images and lab results as part of my medical decision-making.   EKG Interpretation   Date/Time:  Tuesday December 12 2015 10:56:41 EST Ventricular Rate:  86 PR Interval:  150 QRS Duration: 64 QT Interval:  350 QTC Calculation: 418 R Axis:   87 Text Interpretation:  Normal sinus rhythm with sinus arrhythmia Low  voltage QRS Borderline ECG When compared  with ECG of 12/22/2014, No  significant change was found Confirmed by [facility]  MD, DAVID ([id number]) on  12/12/2015 6:01:44 PM      MDM   Final diagnoses:  Palpitation    Patient is a 40 year old female who presents via private vehicle for evaluation of palpitations that occurred last night and this morning. She denies chest pain, shortness of breath. She presented today because last time she had palpitations she was found to have an elevated troponin required cath as well as cardiac monitor.  She is a symptomatic upon arrival here. Heart rate 78. EKG normal sinus rhythm with sinus arrhythmia. Troponin 0.00, 0.01 on repeat. BMP, CBC, chest x-ray unremarkable.  I suspect palpitations are either P VCs versus PACs. I encouraged patient to avoid caffeine. She denies use of illicit drugs.  The patient is safe for discharge home with PCP follow-up.  I discussed case with  my attending, Dr. Candelaria Stagers, MD [id number]  Delora Fuel, MD [id number] [id number]

## 2015-12-12 NOTE — ED Notes (Addendum)
Pt reports yesterday she started having chest pain/fluttering feeling in her chest and had a similar occurrence last year when her trop was elevated.

## 2015-12-12 NOTE — Discharge Instructions (Signed)

## 2015-12-19 ENCOUNTER — Encounter: Payer: Self-pay | Admitting: Family Medicine

## 2015-12-19 ENCOUNTER — Ambulatory Visit (INDEPENDENT_AMBULATORY_CARE_PROVIDER_SITE_OTHER): Payer: Federal, State, Local not specified - PPO | Admitting: Family Medicine

## 2015-12-19 VITALS — BP 114/79 | HR 102 | Temp 98.5°F | Resp 16 | Ht 61.0 in | Wt 194.0 lb

## 2015-12-19 DIAGNOSIS — R002 Palpitations: Secondary | ICD-10-CM

## 2015-12-19 DIAGNOSIS — R6889 Other general symptoms and signs: Secondary | ICD-10-CM | POA: Diagnosis not present

## 2015-12-19 DIAGNOSIS — J9801 Acute bronchospasm: Secondary | ICD-10-CM

## 2015-12-19 DIAGNOSIS — J111 Influenza due to unidentified influenza virus with other respiratory manifestations: Secondary | ICD-10-CM

## 2015-12-19 DIAGNOSIS — L659 Nonscarring hair loss, unspecified: Secondary | ICD-10-CM | POA: Diagnosis not present

## 2015-12-19 LAB — POCT INFLUENZA A/B
Influenza A, POC: NEGATIVE
Influenza B, POC: NEGATIVE

## 2015-12-19 MED ORDER — MUCINEX DM 30-600 MG PO TB12: 1.0000 | ORAL_TABLET | Freq: Two times a day (BID) | ORAL | Status: DC

## 2015-12-19 MED ORDER — OSELTAMIVIR PHOSPHATE 75 MG PO CAPS: 75.0000 mg | ORAL_CAPSULE | Freq: Two times a day (BID) | ORAL | Status: DC

## 2015-12-19 MED ORDER — ALBUTEROL SULFATE HFA 108 (90 BASE) MCG/ACT IN AERS: 2.0000 | INHALATION_SPRAY | Freq: Four times a day (QID) | RESPIRATORY_TRACT | Status: DC | PRN

## 2015-12-19 MED ORDER — BECLOMETHASONE DIPROPIONATE 40 MCG/ACT IN AERS: 2.0000 | INHALATION_SPRAY | Freq: Two times a day (BID) | RESPIRATORY_TRACT | Status: DC

## 2015-12-19 MED ORDER — AZELASTINE HCL 0.1 % NA SOLN: 2.0000 | Freq: Two times a day (BID) | NASAL | Status: DC

## 2015-12-19 NOTE — Progress Notes (Signed)
Subjective:    Patient ID: Sherri Buckley, female    DOB: [birth year], 40 y.o.   MRN: [id number]  12/19/2015  Follow-up; Cough; Sore Throat; and Generalized Body Aches   HPI This 40 y.o. female presents for evaluation of palpitations.  One week ago, experienced heart palpitations.  Same symptoms one year ago with admission; troponin was elevated last year so was admitted.  Normal Troponin last week.  Suffering with persistent palpitations yesterday; improved overnight; now persistent today.  Last did event monitor last year; negative.  No new medications or supplements; no caffeine; +alcohol on Sundays.  No sudafed.  Cold:  Onset today.  No fever/chills/sweats; +body aches this morning. +started coughing four days ago; sneezing developed; +bad headache; took Advil this morning with improvement.  +rhinorrhea; +nasal congestion.  No sputum; no SOB.  No v/d.  No flu vaccine.  Taking Vitamin daily.   Review of Systems  Constitutional: Negative for fever, chills, diaphoresis and fatigue.  Eyes: Negative for visual disturbance.  Respiratory: Negative for cough and shortness of breath.   Cardiovascular: Negative for chest pain, palpitations and leg swelling.  Gastrointestinal: Negative for nausea, vomiting, abdominal pain, diarrhea and constipation.  Endocrine: Negative for cold intolerance, heat intolerance, polydipsia, polyphagia and polyuria.  Neurological: Negative for dizziness, tremors, seizures, syncope, facial asymmetry, speech difficulty, weakness, light-headedness, numbness and headaches.    Past Medical History  Diagnosis Date  . Blood type, Rh negative   . Substance abuse   . Asthma   . Chronic bronchitis ([place])     "get it q yr"  . [id number])     "monthly" (12/21/2014)  . Osteoarthritis of ankle, right   . Seasonal allergies   . Allergy   . Depression 10/07/1998   Past Surgical History  Procedure Laterality Date  . Breast biopsy Right ~ 1993    "took out some benign  tissue"   No Known Allergies  Social History   Social History  . Marital Status: Single    Spouse Name: N/A  . Number of Children: N/A  . Years of Education: N/A   Occupational History  .  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 Smoking status: Former Research scientist (life sciences)  . Smokeless tobacco: Never Used  . Alcohol Use: 0.0 oz/week    0 Standard drinks or equivalent per week     Comment: 12/21/2014 "quit drinking in 2013"  . Drug Use: Yes    Special: Marijuana     Comment: "quit smoking marijuana in 2013"  . Sexual Activity: No   Other Topics Concern  . Not on file   Social History Narrative   Marital status: single; not dating      Children:  None      Lives: alone in house.      Employment: Actor x 11 years; clerk processing; happy; 3rd shift;       Tobacco:  None      Alcohol: rare alcohol; previous alcoholism; stopped drinking heavily 2014; previous counseling; went through EAP with work.      Drugs:  None; marijuana; rare use now.      Exercise:  None      Seatbelt:  100%      Guns:  None      Sexual activity:  Total partners = 25; history of crabs; dates males only.  Last STD screening 4 years ago.     Family History  Problem Relation  Age of Onset  . Hypertension Paternal Grandmother   . Diabetes Paternal Grandmother   . Heart disease Maternal Grandmother     9 bypass surgery  . Cancer Maternal Grandmother     mouth  . Sickle cell trait Father   . Seizures Father   . Alcohol abuse Father   . Thyroid disease Mother     as a child  . Hypertension Mother   . Anemia Mother   . Migraines Mother   . Hernia Mother   . Diabetes Mother   . Arthritis Maternal Aunt        Objective:    BP 114/79 mmHg  Pulse 102  Temp(Src) 98.5 F (36.9 C) (Oral)  Resp 16  Ht 5\' 1"  (1.549 m)  Wt 194 lb (87.998 kg)  BMI 36.67 kg/m2  SpO2 96%  LMP 12/05/2015 Physical Exam  Constitutional: She is oriented to person, place, and time. She  appears well-developed and well-nourished. No distress.  HENT:  Head: Normocephalic and atraumatic.  Right Ear: External ear normal.  Left Ear: External ear normal.  Nose: Nose normal.  Mouth/Throat: Oropharynx is clear and moist.  Eyes: Conjunctivae and EOM are normal. Pupils are equal, round, and reactive to light.  Neck: Normal range of motion. Neck supple. Carotid bruit is not present. No thyromegaly present.  Cardiovascular: Normal rate, regular rhythm, normal heart sounds and intact distal pulses.  Exam reveals no gallop and no friction rub.   No murmur heard. Pulmonary/Chest: Effort normal and breath sounds normal. She has no wheezes. She has no rales.  Abdominal: Soft. Bowel sounds are normal. She exhibits no distension and no mass. There is no tenderness. There is no rebound and no guarding.  Lymphadenopathy:    She has no cervical adenopathy.  Neurological: She is alert and oriented to person, place, and time. No cranial nerve deficit.  Skin: Skin is warm and dry. No rash noted. She is not diaphoretic. No erythema. No pallor.  Psychiatric: She has a normal mood and affect. Her behavior is normal.        Assessment & Plan:   1. Palpitations   2. Influenza   3. Hair thinning   4. Bronchospasm   5. Flu-like symptoms     Orders Placed This Encounter  Procedures  . CBC with Differential/Platelet  . Comprehensive metabolic panel  . TSH  . Iron  . IBC panel  . VITAMIN D 25 Hydroxy (Vit-D Deficiency, Fractures)  . Ambulatory referral to Cardiology    Referral Priority:  Routine    Referral Type:  Consultation    Referral Reason:  Specialty Services Required    Requested Specialty:  Cardiology    Number of Visits Requested:  1  . POCT Influenza A/B  . EKG 12-Lead   Meds ordered this encounter  Medications  . DISCONTD: oseltamivir (TAMIFLU) 75 MG capsule    Sig: Take 1 capsule (75 mg total) by mouth 2 (two) times daily.    Dispense:  10 capsule    Refill:  0  .  Dextromethorphan-Guaifenesin (MUCINEX DM) 30-600 MG TB12    Sig: Take 1 tablet by mouth 2 (two) times daily.    Dispense:  28 each    Refill:  0  . azelastine (ASTELIN) 0.1 % nasal spray    Sig: Place 2 sprays into both nostrils 2 (two) times daily. Use in each nostril as directed    Dispense:  30 mL    Refill:  12  .  albuterol (PROVENTIL HFA;VENTOLIN HFA) 108 (90 Base) MCG/ACT inhaler    Sig: Inhale 2 puffs into the lungs every 6 (six) hours as needed for wheezing or shortness of breath (cough, shortness of breath or wheezing.).    Dispense:  1 Inhaler    Refill:  2  . beclomethasone (QVAR) 40 MCG/ACT inhaler    Sig: Inhale 2 puffs into the lungs 2 (two) times daily.    Dispense:  1 Inhaler    Refill:  12    No Follow-up on file.    Kristi Elayne Guerin, M.D. [facility] [address] [place], [place]  [address] [phone] phone [phone] fax

## 2015-12-19 NOTE — Patient Instructions (Addendum)
   IF you received an x-ray today, you will receive an invoice from [facility]. Please contact [facility] at [phone] with questions or concerns regarding your invoice.   IF you received labwork today, you will receive an invoice from [facility]. Please contact [facility] at [phone] with questions or concerns regarding your invoice.   Our billing staff will not be able to assist you with questions regarding bills from these companies.  You will be contacted with the lab results as soon as they are available. The fastest way to get your results is to activate your My Chart account. Instructions are located on the last page of this paperwork. If you have not heard from us regarding the results in 2 weeks, please contact this office.     Influenza, Adult Influenza ("the flu") is a viral infection of the respiratory tract. It occurs more often in winter months because people spend more time in close contact with one another. Influenza can make you feel very sick. Influenza easily spreads from person to person (contagious). CAUSES  Influenza is caused by a virus that infects the respiratory tract. You can catch the virus by breathing in droplets from an infected person's cough or sneeze. You can also catch the virus by touching something that was recently contaminated with the virus and then touching your mouth, nose, or eyes. RISKS AND COMPLICATIONS You may be at risk for a more severe case of influenza if you smoke cigarettes, have diabetes, have chronic heart disease (such as heart failure) or lung disease (such as asthma), or if you have a weakened immune system. Elderly people and pregnant women are also at risk for more serious infections. The most common problem of influenza is a lung infection (pneumonia). Sometimes, this problem can require emergency medical care and may be life threatening. SIGNS AND SYMPTOMS  Symptoms typically last 4  to 10 days and may include:  Fever.  Chills.  Headache, body aches, and muscle aches.  Sore throat.  Chest discomfort and cough.  Poor appetite.  Weakness or feeling tired.  Dizziness.  Nausea or vomiting. DIAGNOSIS  Diagnosis of influenza is often made based on your history and a physical exam. A nose or throat swab test can be done to confirm the diagnosis. TREATMENT  In mild cases, influenza goes away on its own. Treatment is directed at relieving symptoms. For more severe cases, your health care provider may prescribe antiviral medicines to shorten the sickness. Antibiotic medicines are not effective because the infection is caused by a virus, not by bacteria. HOME CARE INSTRUCTIONS  Take medicines only as directed by your health care provider.  Use a cool mist humidifier to make breathing easier.  Get plenty of rest until your temperature returns to normal. This usually takes 3 to 4 days.  Drink enough fluid to keep your urine clear or pale yellow.  Cover yourmouth and nosewhen coughing or sneezing,and wash your handswellto prevent thevirusfrom spreading.  Stay homefromwork orschool untilthe fever is gonefor at least 1full day. PREVENTION  An annual influenza vaccination (flu shot) is the best way to avoid getting influenza. An annual flu shot is now routinely recommended for all adults in the [place] SEEK MEDICAL CARE IF:  You experiencechest pain, yourcough worsens,or you producemore mucus.  Youhave nausea,vomiting, ordiarrhea.  Your fever returns or gets worse. SEEK IMMEDIATE MEDICAL CARE IF:  You havetrouble breathing, you become short of breath,or your skin ornails becomebluish.  You have severe painor   stiffnessin the neck.  You develop a sudden headache, or pain in the face or ear.  You have nausea or vomiting that you cannot control. MAKE SURE YOU:   Understand these instructions.  Will watch your condition.  Will get help  right away if you are not doing well or get worse.   This information is not intended to replace advice given to you by your health care provider. Make sure you discuss any questions you have with your health care provider.   Document Released: 09/20/2000 Document Revised: 10/14/2014 Document Reviewed: 12/23/2011 Elsevier Interactive Patient Education 2016 Elsevier Inc.  

## 2015-12-20 LAB — CBC WITH DIFFERENTIAL/PLATELET
BASOS ABS: 0.1 10*3/uL (ref 0.0–0.1)
BASOS PCT: 1 % (ref 0–1)
EOS ABS: 0.4 10*3/uL (ref 0.0–0.7)
EOS PCT: 7 % — AB (ref 0–5)
HCT: 39.2 % (ref 36.0–46.0)
Hemoglobin: 13.1 g/dL (ref 12.0–15.0)
LYMPHS ABS: 1.5 10*3/uL (ref 0.7–4.0)
Lymphocytes Relative: 28 % (ref 12–46)
MCH: 26.7 pg (ref 26.0–34.0)
MCHC: 33.4 g/dL (ref 30.0–36.0)
MCV: 79.8 fL (ref 78.0–100.0)
MPV: 11.2 fL (ref 8.6–12.4)
Monocytes Absolute: 1.2 10*3/uL — ABNORMAL HIGH (ref 0.1–1.0)
Monocytes Relative: 22 % — ABNORMAL HIGH (ref 3–12)
Neutro Abs: 2.3 10*3/uL (ref 1.7–7.7)
Neutrophils Relative %: 42 % — ABNORMAL LOW (ref 43–77)
PLATELETS: 302 10*3/uL (ref 150–400)
RBC: 4.91 MIL/uL (ref 3.87–5.11)
RDW: 14.4 % (ref 11.5–15.5)
WBC: 5.4 10*3/uL (ref 4.0–10.5)

## 2015-12-20 LAB — IBC PANEL
%SAT: 23 % (ref 11–50)
TIBC: 343 ug/dL (ref 250–450)
UIBC: 264 ug/dL (ref 125–400)

## 2015-12-20 LAB — COMPREHENSIVE METABOLIC PANEL
ALT: 14 U/L (ref 6–29)
AST: 15 U/L (ref 10–30)
Albumin: 3.6 g/dL (ref 3.6–5.1)
Alkaline Phosphatase: 39 U/L (ref 33–115)
BILIRUBIN TOTAL: 0.4 mg/dL (ref 0.2–1.2)
BUN: 11 mg/dL (ref 7–25)
CALCIUM: 9 mg/dL (ref 8.6–10.2)
CO2: 25 mmol/L (ref 20–31)
Chloride: 99 mmol/L (ref 98–110)
Creat: 0.7 mg/dL (ref 0.50–1.10)
GLUCOSE: 89 mg/dL (ref 65–99)
Potassium: 4 mmol/L (ref 3.5–5.3)
SODIUM: 136 mmol/L (ref 135–146)
Total Protein: 6.7 g/dL (ref 6.1–8.1)

## 2015-12-20 LAB — TSH: TSH: 2.68 mIU/L

## 2015-12-20 LAB — IRON: Iron: 79 ug/dL (ref 40–190)

## 2015-12-21 LAB — VITAMIN D 25 HYDROXY (VIT D DEFICIENCY, FRACTURES): VIT D 25 HYDROXY: 32 ng/mL (ref 30–100)

## 2015-12-22 ENCOUNTER — Telehealth: Payer: Self-pay

## 2015-12-22 NOTE — Telephone Encounter (Signed)
Pt was seen on 12/19/15 by Dr. Tamala Julian for Flu-like symptoms. She had a work note for her to RTW today 12/22/15. She is not feeling any better and would like her note extended. She would like to RTW on 12/25/15. Please advise at [phone]

## 2015-12-25 ENCOUNTER — Ambulatory Visit: Payer: Federal, State, Local not specified - PPO | Admitting: Cardiology

## 2015-12-27 ENCOUNTER — Encounter: Payer: Self-pay | Admitting: Cardiology

## 2015-12-27 ENCOUNTER — Ambulatory Visit (INDEPENDENT_AMBULATORY_CARE_PROVIDER_SITE_OTHER): Payer: Federal, State, Local not specified - PPO | Admitting: Cardiology

## 2015-12-27 VITALS — BP 110/60 | HR 80 | Ht 61.0 in | Wt 200.0 lb

## 2015-12-27 DIAGNOSIS — R002 Palpitations: Secondary | ICD-10-CM | POA: Diagnosis not present

## 2015-12-27 NOTE — Progress Notes (Signed)
Cardiology Office Note    Date:  12/27/2015   ID:  Sherri Buckley, DOB 03-16-1976, MRN [id number]  PCP:  Reginia Forts, MD  Cardiologist:   Candee Furbish, MD     History of Present Illness:  Sherri Buckley is a 40 y.o. female here for follow-up of palpitations. She was recently in the emergency room. Prior office visit from Dr. Tamala Julian (PCP) on 12/19/15 reviewed. About a year ago she had similar heart palpitations during an admission and troponin was elevated. Her troponin was not elevated during this episode. Last year performed an event monitor which was normal. She does drink alcohol on Sundays. She has had a recent cold with cough, headache. We are asked to see her again for the evaluation of these palpitations.  She states that they're happening a few times a day. She is not taking any cold medicine. No decongestants. She has not taken her albuterol in some time. She describes it as a occasional pause with a pound beat right after. This sounds fairly classic for PVCs.  No chest pain, no shortness of breath. No syncope.    Past Medical History  Diagnosis Date  . Blood type, Rh negative   . Substance abuse   . Asthma   . Chronic bronchitis ([place])     "get it q yr"  . [id number])     "monthly" (12/21/2014)  . Osteoarthritis of ankle, right   . Seasonal allergies   . Allergy   . Depression 10/07/1998    Past Surgical History  Procedure Laterality Date  . Breast biopsy Right ~ 1993    "took out some benign tissue"    Outpatient Prescriptions Prior to Visit  Medication Sig Dispense Refill  . albuterol (PROVENTIL HFA;VENTOLIN HFA) 108 (90 Base) MCG/ACT inhaler Inhale 2 puffs into the lungs every 6 (six) hours as needed for wheezing or shortness of breath (cough, shortness of breath or wheezing.). 1 Inhaler 2  . azelastine (ASTELIN) 0.1 % nasal spray Place 2 sprays into both nostrils 2 (two) times daily. Use in each nostril as directed 30 mL 12  . beclomethasone (QVAR)  40 MCG/ACT inhaler Inhale 2 puffs into the lungs 2 (two) times daily. 1 Inhaler 12  . Dextromethorphan-Guaifenesin (MUCINEX DM) 30-600 MG TB12 Take 1 tablet by mouth 2 (two) times daily. 28 each 0  . fluticasone (FLONASE) 50 MCG/ACT nasal spray Place 2 sprays into both nostrils daily. 16 g 6  . loratadine (CLARITIN) 10 MG tablet Take 10 mg by mouth daily. Reported on 10/25/2015    . Multiple Vitamin (MULTIVITAMIN WITH MINERALS) TABS Take 1 tablet by mouth daily.    Marland Kitchen oseltamivir (TAMIFLU) 75 MG capsule Take 1 capsule (75 mg total) by mouth 2 (two) times daily. (Patient not taking: Reported on 12/27/2015) 10 capsule 0   Facility-Administered Medications Prior to Visit  Medication Dose Route Frequency Provider Last Rate Last Dose  . albuterol (PROVENTIL) (2.5 MG/3ML) 0.083% nebulizer solution 2.5 mg  2.5 mg Nebulization Once Wardell Honour, MD         Allergies:   Review of patient's allergies indicates no known allergies.   Social History   Social History  . Marital Status: Single    Spouse Name: N/A  . Number of Children: N/A  . Years of Education: N/A   Occupational History  .  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 Smoking status: Former Research scientist (life sciences)  . Smokeless  tobacco: Never Used  . Alcohol Use: 0.0 oz/week    0 Standard drinks or equivalent per week     Comment: 12/21/2014 "quit drinking in 2013"  . Drug Use: Yes    Special: Marijuana     Comment: "quit smoking marijuana in 2013"  . Sexual Activity: No   Other Topics Concern  . None   Social History Narrative   Marital status: single; not dating      Children:  None      Lives: alone in house.      Employment: Actor x 11 years; clerk processing; happy; 3rd shift;       Tobacco:  None      Alcohol: rare alcohol; previous alcoholism; stopped drinking heavily 2014; previous counseling; went through EAP with work.      Drugs:  None; marijuana; rare use now.      Exercise:  None       Seatbelt:  100%      Guns:  None      Sexual activity:  Total partners = 25; history of crabs; dates males only.  Last STD screening 4 years ago.       Family History:  The patient's family history includes Alcohol abuse in her father; Anemia in her mother; Arthritis in her maternal aunt; Cancer in her maternal grandmother; Diabetes in her mother and paternal grandmother; Heart disease in her maternal grandmother; Hernia in her mother; Hypertension in her mother and paternal grandmother; Migraines in her mother; Seizures in her father; Sickle cell trait in her father; Thyroid disease in her mother.   ROS:   Please see the history of present illness.    ROS All other systems reviewed and are negative.   PHYSICAL EXAM:   VS:  BP 110/60 mmHg  Pulse 80  Ht 5\' 1"  (1.549 m)  Wt 200 lb (90.719 kg)  BMI 37.81 kg/m2  LMP 12/05/2015   GEN: Well nourished, well developed, in no acute distress HEENT: normal Neck: no JVD, carotid bruits, or masses Cardiac: RRR; no murmurs, rubs, or gallops,no edema  Respiratory:  clear to auscultation bilaterally, normal work of breathing GI: soft, nontender, nondistended, + BS MS: no deformity or atrophy Skin: warm and dry, no rash Neuro:  Alert and Oriented x 3, Strength and sensation are intact Psych: euthymic mood, full affect  Wt Readings from Last 3 Encounters:  12/27/15 200 lb (90.719 kg)  12/19/15 194 lb (87.998 kg)  12/12/15 200 lb (90.719 kg)      Studies/Labs Reviewed:   EKG:  No EKG today.   Recent Labs: 12/19/2015: ALT 14; BUN 11; Creat 0.70; Hemoglobin 13.1; Platelets 302; Potassium 4.0; Sodium 136; TSH 2.68   Lipid Panel    Component Value Date/Time   CHOL 142 12/22/2014 0535   TRIG 45 12/22/2014 0535   HDL 42 12/22/2014 0535   CHOLHDL 3.4 12/22/2014 0535   VLDL 9 12/22/2014 0535   LDLCALC 91 12/22/2014 0535    Additional studies/ records that were reviewed today include:   ECHO: 12/22/14:  - Left ventricle: The cavity size  was normal. Wall thickness was normal. Systolic function was normal. The estimated ejection fraction was in the range of 60% to 65%. Wall motion was normal; there were no regional wall motion abnormalities. Doppler parameters are consistent with abnormal left ventricular relaxation (grade 1 diastolic dysfunction).  Impressions:  - Normal LV function; grade 1 diastolic dysfunction; trace MR.  Nuclear stress test: 12/22/14: -Low risk, no  ischemia, normal ejection fraction  Cardiac event monitor 12/27/14: -All recorded symptoms of flutter and palpitations were sinus rhythm/mild sinus tachycardia. No adverse arrhythmias identified. Reassuring.  ASSESSMENT:    1. Intermittent palpitations      PLAN:  In order of problems listed above:  Palpitations -She is feeling better. No current symptoms. Reassurance has been given based upon her prior event monitor, nuclear stress test, echocardiogram. -Interestingly, during her prior event monitor, she had symptoms of fluttering in her chest however this correlated with sinus rhythm. -What she describes now sounds more like PVCs or PACs. Benign. I would not prescribe any further medications at this time. Sometimes we do use metoprolol in certain settings but I do not think that this is necessary. -Also, she states that she has not been using her albuterol. She does have a mild wheeze in the bases of her lungs bilaterally. I explained to her that this is a beta agonist which can promote palpitations. -She has had no high-risk symptoms such as associated syncope or prolonged runs of extreme tachycardia. -She will let me know if any further worrisome symptoms develop.    Medication Adjustments/Labs and Tests Ordered: Current medicines are reviewed at length with the patient today.  Concerns regarding medicines are outlined above.  Medication changes, Labs and Tests ordered today are listed in the Patient Instructions below. Patient  Instructions  Your physician recommends that you continue on your current medications as directed. Please refer to the Current Medication list given to you today. Your physician recommends that you schedule a follow-up appointment as needed with Dr. Marlou Porch.        Bobby Rumpf, MD  12/27/2015 12:06 PM    [facility] [place], [place], [place]  [address] Phone: [phone]; Fax: [phone]

## 2015-12-27 NOTE — Patient Instructions (Signed)
Your physician recommends that you continue on your current medications as directed. Please refer to the Current Medication list given to you today. Your physician recommends that you schedule a follow-up appointment as needed with Dr. Marlou Porch.

## 2016-01-19 ENCOUNTER — Ambulatory Visit (INDEPENDENT_AMBULATORY_CARE_PROVIDER_SITE_OTHER): Payer: Federal, State, Local not specified - PPO | Admitting: Family Medicine

## 2016-01-19 VITALS — BP 122/80 | HR 86 | Temp 97.9°F | Resp 17 | Ht 61.0 in | Wt 199.0 lb

## 2016-01-19 DIAGNOSIS — J309 Allergic rhinitis, unspecified: Secondary | ICD-10-CM | POA: Diagnosis not present

## 2016-01-19 DIAGNOSIS — J9801 Acute bronchospasm: Secondary | ICD-10-CM

## 2016-01-19 DIAGNOSIS — J4521 Mild intermittent asthma with (acute) exacerbation: Secondary | ICD-10-CM

## 2016-01-19 MED ORDER — PREDNISONE 20 MG PO TABS: 40.0000 mg | ORAL_TABLET | Freq: Every day | ORAL | Status: DC

## 2016-01-19 NOTE — Patient Instructions (Addendum)
IF you received an x-ray today, you will receive an invoice from [facility]. Please contact [facility] at [phone] with questions or concerns regarding your invoice.   IF you received labwork today, you will receive an invoice from [employer]. Please contact [facility] at [phone] with questions or concerns regarding your invoice.   Our billing staff will not be able to assist you with questions regarding bills from these companies.  You will be contacted with the lab results as soon as they are available. The fastest way to get your results is to activate your My Chart account. Instructions are located on the last page of this paperwork. If you have not heard from [place] regarding the results in 2 weeks, please contact this office.     For asthma, use the Qvar 1 puff twice per day every day for maintenance, and albuterol up to every 4 hours if needed for wheezing or tightness. If your wheezing is not improving in the next 1-2 days, can start prednisone pills as we discussed (Prednisone was sent to the pharmacy, but you can have them put that on hold for the next day or 2 to see if it is needed). If wheezing and albuterol needed more frequently than every 4 hours or other worsening symptoms, return here or the emergency room.  For your allergies, take Claritin over-the-counter once per day, and Flonase nasal sprays 2 sprays per nostril once per day using technique we discussed.  Return to the clinic or go to the nearest emergency room if any of your symptoms worsen or new symptoms occur. Allergic Rhinitis Allergic rhinitis is when the mucous membranes in the nose respond to allergens. Allergens are particles in the air that cause your body to have an allergic reaction. This causes you to release allergic antibodies. Through a chain of events, these eventually cause you to release histamine into the blood stream. Although meant to protect the  body, it is this release of histamine that causes your discomfort, such as frequent sneezing, congestion, and an itchy, runny nose.  CAUSES Seasonal allergic rhinitis (hay fever) is caused by pollen allergens that may come from grasses, trees, and weeds. Year-round allergic rhinitis (perennial allergic rhinitis) is caused by allergens such as house dust mites, pet dander, and mold spores. SYMPTOMS  Nasal stuffiness (congestion).  Itchy, runny nose with sneezing and tearing of the eyes. DIAGNOSIS Your health care provider can help you determine the allergen or allergens that trigger your symptoms. If you and your health care provider are unable to determine the allergen, skin or blood testing may be used. Your health care provider will diagnose your condition after taking your health history and performing a physical exam. Your health care provider may assess you for other related conditions, such as asthma, pink eye, or an ear infection. TREATMENT Allergic rhinitis does not have a cure, but it can be controlled by:  Medicines that block allergy symptoms. These may include allergy shots, nasal sprays, and oral antihistamines.  Avoiding the allergen. Hay fever may often be treated with antihistamines in pill or nasal spray forms. Antihistamines block the effects of histamine. There are over-the-counter medicines that may help with nasal congestion and swelling around the eyes. Check with your health care provider before taking or giving this medicine. If avoiding the allergen or the medicine prescribed do not work, there are many new medicines your health care provider can prescribe. Stronger medicine may be used if initial measures  are ineffective. Desensitizing injections can be used if medicine and avoidance does not work. Desensitization is when a patient is given ongoing shots until the body becomes less sensitive to the allergen. Make sure you follow up with your health care provider if problems  continue. HOME CARE INSTRUCTIONS It is not possible to completely avoid allergens, but you can reduce your symptoms by taking steps to limit your exposure to them. It helps to know exactly what you are allergic to so that you can avoid your specific triggers. SEEK MEDICAL CARE IF:  You have a fever.  You develop a cough that does not stop easily (persistent).  You have shortness of breath.  You start wheezing.  Symptoms interfere with normal daily activities.   This information is not intended to replace advice given to you by your health care provider. Make sure you discuss any questions you have with your health care provider.   Document Released: 06/18/2001 Document Revised: 10/14/2014 Document Reviewed: 05/31/2013 Elsevier Interactive Patient Education 2016 [place].   Asthma, Adult Asthma is a recurring condition in which the airways tighten and narrow. Asthma can make it difficult to breathe. It can cause coughing, wheezing, and shortness of breath. Asthma episodes, also called asthma attacks, range from minor to life-threatening. Asthma cannot be cured, but medicines and lifestyle changes can help control it. CAUSES Asthma is believed to be caused by inherited (genetic) and environmental factors, but its exact cause is unknown. Asthma may be triggered by allergens, lung infections, or irritants in the air. Asthma triggers are different for each person. Common triggers include:   Animal dander.  Dust mites.  Cockroaches.  Pollen from trees or grass.  Mold.  Smoke.  Air pollutants such as dust, household cleaners, hair sprays, aerosol sprays, paint fumes, strong chemicals, or strong odors.  Cold air, weather changes, and winds (which increase molds and pollens in the air).  Strong emotional expressions such as crying or laughing hard.  Stress.  Certain medicines (such as aspirin) or types of drugs (such as beta-blockers).  Sulfites in foods and drinks. Foods  and drinks that may contain sulfites include dried fruit, potato chips, and sparkling grape juice.  Infections or inflammatory conditions such as the flu, a cold, or an inflammation of the nasal membranes (rhinitis).  Gastroesophageal reflux disease (GERD).  Exercise or strenuous activity. SYMPTOMS Symptoms may occur immediately after asthma is triggered or many hours later. Symptoms include:  Wheezing.  Excessive nighttime or early morning coughing.  Frequent or severe coughing with a common cold.  Chest tightness.  Shortness of breath. DIAGNOSIS  The diagnosis of asthma is made by a review of your medical history and a physical exam. Tests may also be performed. These may include:  Lung function studies. These tests show how much air you breathe in and out.  Allergy tests.  Imaging tests such as X-rays. TREATMENT  Asthma cannot be cured, but it can usually be controlled. Treatment involves identifying and avoiding your asthma triggers. It also involves medicines. There are 2 classes of medicine used for asthma treatment:   Controller medicines. These prevent asthma symptoms from occurring. They are usually taken every day.  Reliever or rescue medicines. These quickly relieve asthma symptoms. They are used as needed and provide short-term relief. Your health care provider will help you create an asthma action plan. An asthma action plan is a written plan for managing and treating your asthma attacks. It includes a list of your asthma triggers and  how they may be avoided. It also includes information on when medicines should be taken and when their dosage should be changed. An action plan may also involve the use of a device called a peak flow meter. A peak flow meter measures how well the lungs are working. It helps you monitor your condition. HOME CARE INSTRUCTIONS   Take medicines only as directed by your health care provider. Speak with your health care provider if you have  questions about how or when to take the medicines.  Use a peak flow meter as directed by your health care provider. Record and keep track of readings.  Understand and use the action plan to help minimize or stop an asthma attack without needing to seek medical care.  Control your home environment in the following ways to help prevent asthma attacks:  Do not smoke. Avoid being exposed to secondhand smoke.  Change your heating and air conditioning filter regularly.  Limit your use of fireplaces and wood stoves.  Get rid of pests (such as roaches and mice) and their droppings.  Throw away plants if you see mold on them.  Clean your floors and dust regularly. Use unscented cleaning products.  Try to have someone else vacuum for you regularly. Stay out of rooms while they are being vacuumed and for a short while afterward. If you vacuum, use a dust mask from a hardware store, a double-layered or microfilter vacuum cleaner bag, or a vacuum cleaner with a HEPA filter.  Replace carpet with wood, tile, or vinyl flooring. Carpet can trap dander and dust.  Use allergy-proof pillows, mattress covers, and box spring covers.  Wash bed sheets and blankets every week in hot water and dry them in a dryer.  Use blankets that are made of polyester or cotton.  Clean bathrooms and kitchens with bleach. If possible, have someone repaint the walls in these rooms with mold-resistant paint. Keep out of the rooms that are being cleaned and painted.  Wash hands frequently. SEEK MEDICAL CARE IF:   You have wheezing, shortness of breath, or a cough even if taking medicine to prevent attacks.  The colored mucus you cough up (sputum) is thicker than usual.  Your sputum changes from clear or white to yellow, green, gray, or bloody.  You have any problems that may be related to the medicines you are taking (such as a rash, itching, swelling, or trouble breathing).  You are using a reliever medicine more  than 2-3 times per week.  Your peak flow is still at 50-79% of your personal best after following your action plan for 1 hour.  You have a fever. SEEK IMMEDIATE MEDICAL CARE IF:   You seem to be getting worse and are unresponsive to treatment during an asthma attack.  You are short of breath even at rest.  You get short of breath when doing very little physical activity.  You have difficulty eating, drinking, or talking due to asthma symptoms.  You develop chest pain.  You develop a fast heartbeat.  You have a bluish color to your lips or fingernails.  You are light-headed, dizzy, or faint.  Your peak flow is less than 50% of your personal best.   This information is not intended to replace advice given to you by your health care provider. Make sure you discuss any questions you have with your health care provider.   Document Released: 09/23/2005 Document Revised: 06/14/2015 Document Reviewed: 04/22/2013 Elsevier Interactive Patient Education [employer].

## 2016-01-19 NOTE — Progress Notes (Signed)
Subjective:  By signing my name below, I, [name], attest that this documentation has been prepared under the direction and in the presence of Merri Ray, MD.  Electronically Signed: Thea Alken, ED Scribe. 01/15/2016. 11:50 AM.   Patient ID: Sherri Buckley, female    DOB: 01/31/1976, 40 y.o.   MRN: [id number]  HPI Chief Complaint  Patient presents with  . Cough  . wheezing    HPI Comments: Sherri Buckley is a 40 y.o. female who presents to the [facility] complaining of gradually worsening cough and wheezing that began 1 week ago.She has hx asthma and allergies, treated with qvar and albuterol. Pt states she only took qvar and albuterol for a short period of after it was prescribed 1-2 years ago and had stopped. She restarted qvar 3 days ago and has been using is sporadically  after developing worsening wheezing. She also restarted albuterol 2 days ago, used twice yesterday and once this morning, 3.5 hours ago. Pt states she woke up this morning worsening wheezing. She denies taking OTC medication for allergies. She denies hx of DM   Patient Active Problem List   Diagnosis Date Noted  . Palpitation 12/22/2014  . Elevated troponin 12/21/2014  . Allergic rhinitis 03/06/2014  . Nipple discharge 06/21/2013  . Hypovitaminosis D 02/09/2013  . Alcohol abuse 02/09/2013   Past Medical History  Diagnosis Date  . Blood type, Rh negative   . Substance abuse   . Asthma   . Chronic bronchitis ([place])     "get it q yr"  . [id number])     "monthly" (12/21/2014)  . Osteoarthritis of ankle, right   . Seasonal allergies   . Allergy   . Depression 10/07/1998   Past Surgical History  Procedure Laterality Date  . Breast biopsy Right ~ 1993    "took out some benign tissue"   No Known Allergies Prior to Admission medications   Medication Sig Start Date End Date Taking? Authorizing Provider  albuterol (PROVENTIL HFA;VENTOLIN HFA) 108 (90 Base) MCG/ACT inhaler  Inhale 2 puffs into the lungs every 6 (six) hours as needed for wheezing or shortness of breath (cough, shortness of breath or wheezing.). 12/19/15  Yes Wardell Honour, MD  azelastine (ASTELIN) 0.1 % nasal spray Place 2 sprays into both nostrils 2 (two) times daily. Use in each nostril as directed 12/19/15  Yes Wardell Honour, MD  beclomethasone (QVAR) 40 MCG/ACT inhaler Inhale 2 puffs into the lungs 2 (two) times daily. 12/19/15  Yes Wardell Honour, MD  Multiple Vitamin (MULTIVITAMIN WITH MINERALS) TABS Take 1 tablet by mouth daily.   Yes Historical Provider, MD   Social History   Social History  . Marital Status: Single    Spouse Name: N/A  . Number of Children: N/A  . Years of Education: N/A   Occupational History  .  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 Smoking status: Former Research scientist (life sciences)  . Smokeless tobacco: Never Used  . Alcohol Use: 0.0 oz/week    0 Standard drinks or equivalent per week     Comment: 12/21/2014 "quit drinking in 2013"  . Drug Use: Yes    Special: Marijuana     Comment: "quit smoking marijuana in 2013"  . Sexual Activity: No   Other Topics Concern  . Not on file   Social History Narrative   Marital status: single; not dating  Children:  None      Lives: alone in house.      Employment: Actor x 11 years; clerk processing; happy; 3rd shift;       Tobacco:  None      Alcohol: rare alcohol; previous alcoholism; stopped drinking heavily 2014; previous counseling; went through EAP with work.      Drugs:  None; marijuana; rare use now.      Exercise:  None      Seatbelt:  100%      Guns:  None      Sexual activity:  Total partners = 25; history of crabs; dates males only.  Last STD screening 4 years ago.     Review of Systems  Constitutional: Negative for fever and chills.  Respiratory: Positive for cough and wheezing.   Allergic/Immunologic: Positive for environmental allergies.    Objective:   Physical Exam    Constitutional: She is oriented to person, place, and time. She appears well-developed and well-nourished. No distress.  HENT:  Head: Normocephalic and atraumatic.  Right Ear: Hearing, tympanic membrane, external ear and ear canal normal.  Left Ear: Hearing, tympanic membrane, external ear and ear canal normal.  Nose: Nose normal.  Mouth/Throat: Oropharynx is clear and moist. No oropharyngeal exudate.  Eyes: Conjunctivae and EOM are normal. Pupils are equal, round, and reactive to light.  Cardiovascular: Normal rate, regular rhythm, normal heart sounds and intact distal pulses.   No murmur heard. Pulmonary/Chest: Effort normal. No respiratory distress. She has wheezes ( faint in-expiratory). She has no rhonchi.  Neurological: She is alert and oriented to person, place, and time.  Skin: Skin is warm and dry. No rash noted.  Psychiatric: She has a normal mood and affect. Her behavior is normal.  Vitals reviewed.  Filed Vitals:   01/19/16 0948  BP: 122/80  Pulse: 86  Temp: 97.9 F (36.6 C)  TempSrc: Oral  Resp: 17  Height: 5\' 1"  (1.549 m)  Weight: 199 lb (90.266 kg)  SpO2: 96%    Assessment & Plan:   Sherri Buckley is a 40 y.o. female Bronchospasm  Allergic rhinitis, unspecified allergic rhinitis type  -Claritin daily, Flonase nasal spray daily if needed for allergy treatment. Correct use discussed.  Asthma, mild intermittent, with acute exacerbation - Plan: predniSONE (DELTASONE) 20 MG tablet  -minimal wheeze on exam today, may have mild flare.  -Reinforced need for Qvar twice per day every day as maintenance medication, albuterol up to every 4-6 hours.  If not improving with use of Qvar over the next 24-48 hrs, can start the prednisone, 40 mg daily 3 days. Side effects discussed. RTC precautions discussed.   Recheck in the next 3 months for asthma assessment, sooner if worse, RTC/ER precautions.   Meds ordered this encounter  Medications  . predniSONE (DELTASONE) 20  MG tablet    Sig: Take 2 tablets (40 mg total) by mouth daily with breakfast.    Dispense:  6 tablet    Refill:  0   Patient Instructions       IF you received an x-ray today, you will receive an invoice from [facility]. Please contact [facility] at [phone] with questions or concerns regarding your invoice.   IF you received labwork today, you will receive an invoice from [employer]. Please contact [facility] at [phone] with questions or concerns regarding your invoice.   Our billing staff will not be able to assist you with questions regarding bills from these  companies.  You will be contacted with the lab results as soon as they are available. The fastest way to get your results is to activate your My Chart account. Instructions are located on the last page of this paperwork. If you have not heard from [place] regarding the results in 2 weeks, please contact this office.     For asthma, use the Qvar 1 puff twice per day every day for maintenance, and albuterol up to every 4 hours if needed for wheezing or tightness. If your wheezing is not improving in the next 1-2 days, can start prednisone pills as we discussed (Prednisone was sent to the pharmacy, but you can have them put that on hold for the next day or 2 to see if it is needed). If wheezing and albuterol needed more frequently than every 4 hours or other worsening symptoms, return here or the emergency room.  For your allergies, take Claritin over-the-counter once per day, and Flonase nasal sprays 2 sprays per nostril once per day using technique we discussed.  Return to the clinic or go to the nearest emergency room if any of your symptoms worsen or new symptoms occur. Allergic Rhinitis Allergic rhinitis is when the mucous membranes in the nose respond to allergens. Allergens are particles in the air that cause your body to have an allergic reaction. This causes you to release  allergic antibodies. Through a chain of events, these eventually cause you to release histamine into the blood stream. Although meant to protect the body, it is this release of histamine that causes your discomfort, such as frequent sneezing, congestion, and an itchy, runny nose.  CAUSES Seasonal allergic rhinitis (hay fever) is caused by pollen allergens that may come from grasses, trees, and weeds. Year-round allergic rhinitis (perennial allergic rhinitis) is caused by allergens such as house dust mites, pet dander, and mold spores. SYMPTOMS  Nasal stuffiness (congestion).  Itchy, runny nose with sneezing and tearing of the eyes. DIAGNOSIS Your health care provider can help you determine the allergen or allergens that trigger your symptoms. If you and your health care provider are unable to determine the allergen, skin or blood testing may be used. Your health care provider will diagnose your condition after taking your health history and performing a physical exam. Your health care provider may assess you for other related conditions, such as asthma, pink eye, or an ear infection. TREATMENT Allergic rhinitis does not have a cure, but it can be controlled by:  Medicines that block allergy symptoms. These may include allergy shots, nasal sprays, and oral antihistamines.  Avoiding the allergen. Hay fever may often be treated with antihistamines in pill or nasal spray forms. Antihistamines block the effects of histamine. There are over-the-counter medicines that may help with nasal congestion and swelling around the eyes. Check with your health care provider before taking or giving this medicine. If avoiding the allergen or the medicine prescribed do not work, there are many new medicines your health care provider can prescribe. Stronger medicine may be used if initial measures are ineffective. Desensitizing injections can be used if medicine and avoidance does not work. Desensitization is when a  patient is given ongoing shots until the body becomes less sensitive to the allergen. Make sure you follow up with your health care provider if problems continue. HOME CARE INSTRUCTIONS It is not possible to completely avoid allergens, but you can reduce your symptoms by taking steps to limit your exposure to them. It helps to know exactly  what you are allergic to so that you can avoid your specific triggers. SEEK MEDICAL CARE IF:  You have a fever.  You develop a cough that does not stop easily (persistent).  You have shortness of breath.  You start wheezing.  Symptoms interfere with normal daily activities.   This information is not intended to replace advice given to you by your health care provider. Make sure you discuss any questions you have with your health care provider.   Document Released: 06/18/2001 Document Revised: 10/14/2014 Document Reviewed: 05/31/2013 Elsevier Interactive Patient Education 2016 [place].   Asthma, Adult Asthma is a recurring condition in which the airways tighten and narrow. Asthma can make it difficult to breathe. It can cause coughing, wheezing, and shortness of breath. Asthma episodes, also called asthma attacks, range from minor to life-threatening. Asthma cannot be cured, but medicines and lifestyle changes can help control it. CAUSES Asthma is believed to be caused by inherited (genetic) and environmental factors, but its exact cause is unknown. Asthma may be triggered by allergens, lung infections, or irritants in the air. Asthma triggers are different for each person. Common triggers include:   Animal dander.  Dust mites.  Cockroaches.  Pollen from trees or grass.  Mold.  Smoke.  Air pollutants such as dust, household cleaners, hair sprays, aerosol sprays, paint fumes, strong chemicals, or strong odors.  Cold air, weather changes, and winds (which increase molds and pollens in the air).  Strong emotional expressions such as  crying or laughing hard.  Stress.  Certain medicines (such as aspirin) or types of drugs (such as beta-blockers).  Sulfites in foods and drinks. Foods and drinks that may contain sulfites include dried fruit, potato chips, and sparkling grape juice.  Infections or inflammatory conditions such as the flu, a cold, or an inflammation of the nasal membranes (rhinitis).  Gastroesophageal reflux disease (GERD).  Exercise or strenuous activity. SYMPTOMS Symptoms may occur immediately after asthma is triggered or many hours later. Symptoms include:  Wheezing.  Excessive nighttime or early morning coughing.  Frequent or severe coughing with a common cold.  Chest tightness.  Shortness of breath. DIAGNOSIS  The diagnosis of asthma is made by a review of your medical history and a physical exam. Tests may also be performed. These may include:  Lung function studies. These tests show how much air you breathe in and out.  Allergy tests.  Imaging tests such as X-rays. TREATMENT  Asthma cannot be cured, but it can usually be controlled. Treatment involves identifying and avoiding your asthma triggers. It also involves medicines. There are 2 classes of medicine used for asthma treatment:   Controller medicines. These prevent asthma symptoms from occurring. They are usually taken every day.  Reliever or rescue medicines. These quickly relieve asthma symptoms. They are used as needed and provide short-term relief. Your health care provider will help you create an asthma action plan. An asthma action plan is a written plan for managing and treating your asthma attacks. It includes a list of your asthma triggers and how they may be avoided. It also includes information on when medicines should be taken and when their dosage should be changed. An action plan may also involve the use of a device called a peak flow meter. A peak flow meter measures how well the lungs are working. It helps you monitor  your condition. HOME CARE INSTRUCTIONS   Take medicines only as directed by your health care provider. Speak with your health care provider  if you have questions about how or when to take the medicines.  Use a peak flow meter as directed by your health care provider. Record and keep track of readings.  Understand and use the action plan to help minimize or stop an asthma attack without needing to seek medical care.  Control your home environment in the following ways to help prevent asthma attacks:  Do not smoke. Avoid being exposed to secondhand smoke.  Change your heating and air conditioning filter regularly.  Limit your use of fireplaces and wood stoves.  Get rid of pests (such as roaches and mice) and their droppings.  Throw away plants if you see mold on them.  Clean your floors and dust regularly. Use unscented cleaning products.  Try to have someone else vacuum for you regularly. Stay out of rooms while they are being vacuumed and for a short while afterward. If you vacuum, use a dust mask from a hardware store, a double-layered or microfilter vacuum cleaner bag, or a vacuum cleaner with a HEPA filter.  Replace carpet with wood, tile, or vinyl flooring. Carpet can trap dander and dust.  Use allergy-proof pillows, mattress covers, and box spring covers.  Wash bed sheets and blankets every week in hot water and dry them in a dryer.  Use blankets that are made of polyester or cotton.  Clean bathrooms and kitchens with bleach. If possible, have someone repaint the walls in these rooms with mold-resistant paint. Keep out of the rooms that are being cleaned and painted.  Wash hands frequently. SEEK MEDICAL CARE IF:   You have wheezing, shortness of breath, or a cough even if taking medicine to prevent attacks.  The colored mucus you cough up (sputum) is thicker than usual.  Your sputum changes from clear or white to yellow, green, gray, or bloody.  You have any problems  that may be related to the medicines you are taking (such as a rash, itching, swelling, or trouble breathing).  You are using a reliever medicine more than 2-3 times per week.  Your peak flow is still at 50-79% of your personal best after following your action plan for 1 hour.  You have a fever. SEEK IMMEDIATE MEDICAL CARE IF:   You seem to be getting worse and are unresponsive to treatment during an asthma attack.  You are short of breath even at rest.  You get short of breath when doing very little physical activity.  You have difficulty eating, drinking, or talking due to asthma symptoms.  You develop chest pain.  You develop a fast heartbeat.  You have a bluish color to your lips or fingernails.  You are light-headed, dizzy, or faint.  Your peak flow is less than 50% of your personal best.   This information is not intended to replace advice given to you by your health care provider. Make sure you discuss any questions you have with your health care provider.   Document Released: 09/23/2005 Document Revised: 06/14/2015 Document Reviewed: 04/22/2013 Elsevier Interactive Patient Education [employer].     I personally performed the services described in this documentation, which was scribed in my presence. The recorded information has been reviewed and considered, and addended by me as needed.

## 2016-01-23 ENCOUNTER — Telehealth: Payer: Self-pay

## 2016-01-23 NOTE — Telephone Encounter (Signed)
Spoke with pt, advisedI did not understand what the insurance is referring to. One inhaler =30 days? She will have them call me to clarify.

## 2016-01-23 NOTE — Telephone Encounter (Signed)
Patient called about having her prescription rewritten for only 30 days on her inhalers.   Per patient and pharmacy, Dr. Tamala Julian wrote a prescription for 32 days, instead of 30, which cost the patient an extra $50.00 copay for 2 pills.  Patient ended up with a bill for $100.00 instead of $50.00   albuterol (PROVENTIL HFA;VENTOLIN HFA) 108 (90 Base) MCG/ACT inhaler [id number]  beclomethasone (QVAR) 40 MCG/ACT inhaler [id number]   Pharmacy:  [facility] [place], [place] - [address]  Please call patient when the prescription has been rewritten and sent to [facility].   Pt # [phone]  Thank you,

## 2016-02-19 ENCOUNTER — Encounter: Payer: Federal, State, Local not specified - PPO | Admitting: Family Medicine

## 2016-02-21 ENCOUNTER — Ambulatory Visit (INDEPENDENT_AMBULATORY_CARE_PROVIDER_SITE_OTHER): Payer: Federal, State, Local not specified - PPO | Admitting: Internal Medicine

## 2016-02-21 VITALS — BP 124/81 | HR 89 | Temp 99.1°F | Resp 16 | Ht 61.5 in | Wt 196.0 lb

## 2016-02-21 DIAGNOSIS — Z6836 Body mass index (BMI) 36.0-36.9, adult: Secondary | ICD-10-CM | POA: Diagnosis not present

## 2016-02-21 DIAGNOSIS — R350 Frequency of micturition: Secondary | ICD-10-CM | POA: Diagnosis not present

## 2016-02-21 DIAGNOSIS — J4520 Mild intermittent asthma, uncomplicated: Secondary | ICD-10-CM

## 2016-02-21 DIAGNOSIS — J45909 Unspecified asthma, uncomplicated: Secondary | ICD-10-CM | POA: Insufficient documentation

## 2016-02-21 LAB — POCT URINALYSIS DIP (MANUAL ENTRY)
BILIRUBIN UA: NEGATIVE
Bilirubin, UA: NEGATIVE
Blood, UA: NEGATIVE
GLUCOSE UA: NEGATIVE
Leukocytes, UA: NEGATIVE
Nitrite, UA: NEGATIVE
Protein Ur, POC: NEGATIVE
SPEC GRAV UA: 1.015
Urobilinogen, UA: 0.2
pH, UA: 6

## 2016-02-21 LAB — POC MICROSCOPIC URINALYSIS (UMFC): MUCUS RE: ABSENT

## 2016-02-21 MED ORDER — NITROFURANTOIN MONOHYD MACRO 100 MG PO CAPS: 100.0000 mg | ORAL_CAPSULE | Freq: Two times a day (BID) | ORAL | Status: DC

## 2016-02-21 NOTE — Progress Notes (Signed)
By signing my name below I, Tereasa Coop, attest that this documentation has been prepared under the direction and in the presence of Tami Lin, MD. Electonically Signed. Tereasa Coop, Scribe 02/21/2016 at 1:53 PM  Subjective:    Patient ID: Sherri Buckley, female    DOB: 1976-01-28, 40 y.o.   MRN: [id number]  Chief Complaint  Patient presents with  . Urinary Frequency    x 2 wweks  . Chills    HPI PECOLA CROOKS is a 40 y.o. female who presents to the [facility] complaining of urinary frequency that has been intermittent for the past 2 weeks. Frequency returned today and pt was urinating every 20 minutes. Pt denies any flank pain or dysuria.  Pt also c/o chills, runny nose, and scratchy throat.   Pt takes Claritin to control her allergies.     Patient Active Problem List   Diagnosis Date Noted  . Palpitation 12/22/2014  . Elevated troponin 12/21/2014  . Allergic rhinitis 03/06/2014  . Nipple discharge 06/21/2013  . Hypovitaminosis D 02/09/2013  . Alcohol abuse 02/09/2013    Current outpatient prescriptions:  .  albuterol (PROVENTIL HFA;VENTOLIN HFA) 108 (90 Base) MCG/ACT inhaler, Inhale 2 puffs into the lungs every 6 (six) hours as needed for wheezing or shortness of breath (cough, shortness of breath or wheezing.)., Disp: 1 Inhaler, Rfl: 2 .  beclomethasone (QVAR) 40 MCG/ACT inhaler, Inhale 2 puffs into the lungs 2 (two) times daily., Disp: 1 Inhaler, Rfl: 12 .  Multiple Vitamin (MULTIVITAMIN WITH MINERALS) TABS, Take 1 tablet by mouth daily., Disp: , Rfl:  .  azelastine (ASTELIN) 0.1 % nasal spray, Place 2 sprays into both nostrils 2 (two) times daily. Use in each nostril as directed (Patient not taking: Reported on 02/21/2016), Disp: 30 mL, Rfl: 12 .  nitrofurantoin, macrocrystal-monohydrate, (MACROBID) 100 MG capsule, Take 1 capsule (100 mg total) by mouth 2 (two) times daily., Disp: 14 capsule, Rfl: 0  Current facility-administered  medications:  .  albuterol (PROVENTIL) (2.5 MG/3ML) 0.083% nebulizer solution 2.5 mg, 2.5 mg, Nebulization, Once, Wardell Honour, MD  No Known Allergies    Review of Systems  Constitutional: Positive for chills.  HENT: Positive for congestion.   Genitourinary: Positive for frequency. Negative for dysuria.       Objective:   Physical Exam  Constitutional: She is oriented to person, place, and time. She appears well-developed and well-nourished. No distress.  HENT:  Head: Normocephalic and atraumatic.  Eyes: Conjunctivae are normal. Pupils are equal, round, and reactive to light.  Neck: Neck supple.  Cardiovascular: Normal rate.   Pulmonary/Chest: Effort normal.  Abdominal: Normal appearance. There is no tenderness. There is no CVA tenderness.  Musculoskeletal: Normal range of motion.  Neurological: She is alert and oriented to person, place, and time.  Skin: Skin is warm and dry.  Psychiatric: She has a normal mood and affect. Her behavior is normal.  Nursing note and vitals reviewed.    Filed Vitals:   02/21/16 1312  BP: 124/81  Pulse: 89  Temp: 99.1 F (37.3 C)  Resp: 16  Height: 5' 1.5" (1.562 m)  Weight: 196 lb (88.905 kg)  BP 124/81 mmHg  Pulse 89  Temp(Src) 99.1 F (37.3 C)  Resp 16  Ht 5' 1.5" (1.562 m)  Wt 196 lb (88.905 kg)  BMI 36.44 kg/m2  LMP 01/29/2016  Results for orders placed or performed in visit on 02/21/16  POCT urinalysis dipstick  Result Value Ref Range  Color, UA yellow yellow   Clarity, UA clear clear   Glucose, UA negative negative   Bilirubin, UA negative negative   Ketones, POC UA negative negative   Spec Grav, UA 1.015    Blood, UA negative negative   pH, UA 6.0    Protein Ur, POC negative negative   Urobilinogen, UA 0.2    Nitrite, UA Negative Negative   Leukocytes, UA Negative Negative  POCT Microscopic Urinalysis ([facility])  Result Value Ref Range   WBC,UR,HPF,POC Few (A) None WBC/hpf   RBC,UR,HPF,POC None None RBC/hpf    Bacteria Few (A) None, Too numerous to count   Mucus Absent Absent   Epithelial Cells, UR Per Microscopy Few (A) None, Too numerous to count cells/hpf        Assessment & Plan:  Urinary frequency-no past hx UTI//has known fibroids/no sexual activity for >74yrs/lmp 4/24/wnl/no vag sxtoms or rash  - Plan: Urine culture///start macrobid//call results//reck Sat if not asymptomatic  Allergic rhinitis--otc ok  BMI 36.0-36.9,adult--has f/u D Gessner soon  Meds ordered this encounter  Medications  . nitrofurantoin, macrocrystal-monohydrate, (MACROBID) 100 MG capsule    Sig: Take 1 capsule (100 mg total) by mouth 2 (two) times daily.    Dispense:  14 capsule    Refill:  0    I have completed the patient encounter in its entirety as documented by the scribe, with editing by me where necessary. Zyniah Ferraiolo P. Laney Pastor, M.D.

## 2016-02-21 NOTE — Patient Instructions (Signed)
     IF you received an x-ray today, you will receive an invoice from [facility]. Please contact [facility] at [phone] with questions or concerns regarding your invoice.   IF you received labwork today, you will receive an invoice from [facility]. Please contact [facility] at [phone] with questions or concerns regarding your invoice.   Our billing staff will not be able to assist you with questions regarding bills from these companies.  You will be contacted with the lab results as soon as they are available. The fastest way to get your results is to activate your My Chart account. Instructions are located on the last page of this paperwork. If you have not heard from us regarding the results in 2 weeks, please contact this office.      

## 2016-02-22 LAB — URINE CULTURE
Colony Count: NO GROWTH
Organism ID, Bacteria: NO GROWTH

## 2016-02-26 ENCOUNTER — Ambulatory Visit (INDEPENDENT_AMBULATORY_CARE_PROVIDER_SITE_OTHER): Payer: Federal, State, Local not specified - PPO | Admitting: Family Medicine

## 2016-02-26 VITALS — BP 118/74 | HR 72 | Temp 98.3°F | Resp 17 | Ht 62.0 in | Wt 196.0 lb

## 2016-02-26 DIAGNOSIS — R350 Frequency of micturition: Secondary | ICD-10-CM | POA: Diagnosis not present

## 2016-02-26 LAB — POC MICROSCOPIC URINALYSIS (UMFC): MUCUS RE: ABSENT

## 2016-02-26 LAB — GLUCOSE, POCT (MANUAL RESULT ENTRY): POC Glucose: 102 mg/dl — AB (ref 70–99)

## 2016-02-26 LAB — POCT URINALYSIS DIP (MANUAL ENTRY)
Bilirubin, UA: NEGATIVE
GLUCOSE UA: NEGATIVE
Leukocytes, UA: NEGATIVE
Nitrite, UA: NEGATIVE
PROTEIN UA: NEGATIVE
SPEC GRAV UA: 1.02
UROBILINOGEN UA: 0.2
pH, UA: 5.5

## 2016-02-26 MED ORDER — PHENAZOPYRIDINE HCL 100 MG PO TABS: 100.0000 mg | ORAL_TABLET | Freq: Three times a day (TID) | ORAL | Status: DC | PRN

## 2016-02-26 NOTE — Progress Notes (Addendum)
Subjective:  By signing my name below, I, Sherri Buckley, attest that this documentation has been prepared under the direction and in the presence of Merri Ray, MD.  Electronically Signed: Thea Alken, ED Scribe. 02/26/2016. 11:55 AM.  Patient ID: Sherri Buckley, female    DOB: September 23, 1976, 40 y.o.   MRN: [id number]  HPI   Chief Complaint  Patient presents with  . frequent urination   HPI Comments: Sherri Buckley is a 40 y.o. female who presents to the [facility] complaining of urinary frequency. She was last seen 5 days ago by Dr. Laney Pastor for both urinary frequency and URI symptoms. At that time it was present intermittently for 2 weeks, worse on day of eval. Urine dipstick was negative but few WBC's on micro. She was started on Macrobid 100mg  bid. Urine culture was negative.   Pt has persistent unchanged symptoms of urinary frequency. Initially when seen 5 days ago she would urinate every 20 minutes but now urinates about every hours. She is on day 4 of Macrobid without relief to symptoms. She drinks diet 7-up but no other caffenated beverages and no increase in water intake. She has not been sexually active for the past 5 years. She started menses 3 days ago. She occasional pelvic pain but reports having fibroids. No new vaginal discharge. She denies fever, chills, nausea and emesis.  Patient Active Problem List   Diagnosis Date Noted  . Asthma 02/21/2016  . BMI 36.0-36.9,adult 02/21/2016  . Palpitation 12/22/2014  . Elevated troponin 12/21/2014  . Allergic rhinitis 03/06/2014  . Nipple discharge 06/21/2013  . Hypovitaminosis D 02/09/2013  . Alcohol abuse 02/09/2013   Past Medical History  Diagnosis Date  . Blood type, Rh negative   . Substance abuse   . Asthma   . Chronic bronchitis ([place])     "get it q yr"  . [id number])     "monthly" (12/21/2014)  . Osteoarthritis of ankle, right   . Seasonal allergies   . Allergy   . Depression 10/07/1998     Past Surgical History  Procedure Laterality Date  . Breast biopsy Right ~ 1993    "took out some benign tissue"   No Known Allergies Prior to Admission medications   Medication Sig Start Date End Date Taking? Authorizing Provider  albuterol (PROVENTIL HFA;VENTOLIN HFA) 108 (90 Base) MCG/ACT inhaler Inhale 2 puffs into the lungs every 6 (six) hours as needed for wheezing or shortness of breath (cough, shortness of breath or wheezing.). 12/19/15  Yes Wardell Honour, MD  azelastine (ASTELIN) 0.1 % nasal spray Place 2 sprays into both nostrils 2 (two) times daily. Use in each nostril as directed 12/19/15  Yes Wardell Honour, MD  beclomethasone (QVAR) 40 MCG/ACT inhaler Inhale 2 puffs into the lungs 2 (two) times daily. 12/19/15  Yes Wardell Honour, MD  Multiple Vitamin (MULTIVITAMIN WITH MINERALS) TABS Take 1 tablet by mouth daily.   Yes Historical Provider, MD  nitrofurantoin, macrocrystal-monohydrate, (MACROBID) 100 MG capsule Take 1 capsule (100 mg total) by mouth 2 (two) times daily. 02/21/16  Yes Leandrew Koyanagi, MD   Social History   Social History  . Marital Status: Single    Spouse Name: N/A  . Number of Children: N/A  . Years of Education: N/A   Occupational History  .  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 Smoking status: Former Research scientist (life sciences)  . Smokeless tobacco:  Never Used  . Alcohol Use: 0.0 oz/week    0 Standard drinks or equivalent per week     Comment: 12/21/2014 "quit drinking in 2013"  . Drug Use: Yes    Special: Marijuana     Comment: "quit smoking marijuana in 2013"  . Sexual Activity: No   Other Topics Concern  . Not on file   Social History Narrative   Marital status: single; not dating      Children:  None      Lives: alone in house.      Employment: Actor x 11 years; clerk processing; happy; 3rd shift;       Tobacco:  None      Alcohol: rare alcohol; previous alcoholism; stopped drinking heavily 2014; previous  counseling; went through EAP with work.      Drugs:  None; marijuana; rare use now.      Exercise:  None      Seatbelt:  100%      Guns:  None      Sexual activity:  Total partners = 25; history of crabs; dates males only.  Last STD screening 4 years ago.     Review of Systems  Constitutional: Negative for fever and chills.  Gastrointestinal: Negative for nausea and vomiting.  Genitourinary: Positive for frequency. Negative for dysuria, hematuria, vaginal discharge and menstrual problem.   Objective:   Physical Exam  Constitutional: She is oriented to person, place, and time. She appears well-developed and well-nourished. No distress.  HENT:  Head: Normocephalic and atraumatic.  Eyes: Conjunctivae and EOM are normal.  Neck: Neck supple.  Cardiovascular: Normal rate, regular rhythm and normal heart sounds.   No murmur heard. Pulmonary/Chest: Effort normal and breath sounds normal. She has no wheezes. She has no rales.  Abdominal: Soft. There is no tenderness. There is no CVA tenderness.  Slight pressure sensation suprapubic.  Musculoskeletal: Normal range of motion.  Neurological: She is alert and oriented to person, place, and time.  Skin: Skin is warm and dry.  Psychiatric: She has a normal mood and affect. Her behavior is normal.  Nursing note and vitals reviewed.  Filed Vitals:   02/26/16 1102  BP: 118/74  Pulse: 72  Temp: 98.3 F (36.8 C)  TempSrc: Oral  Resp: 17  Height: 5\' 2"  (1.575 m)  Weight: 196 lb (88.905 kg)  SpO2: 99%     Results for orders placed or performed in visit on 02/26/16  POCT glucose (manual entry)  Result Value Ref Range   POC Glucose 102 (A) 70 - 99 mg/dl  POCT Microscopic Urinalysis ([facility])  Result Value Ref Range   WBC,UR,HPF,POC None None WBC/hpf   RBC,UR,HPF,POC None None RBC/hpf   Bacteria None None, Too numerous to count   Mucus Absent Absent   Epithelial Cells, UR Per Microscopy None None, Too numerous to count cells/hpf  POCT  urinalysis dipstick  Result Value Ref Range   Color, UA yellow yellow   Clarity, UA clear clear   Glucose, UA negative negative   Bilirubin, UA negative negative   Ketones, POC UA moderate (40) (A) negative   Spec Grav, UA 1.020    Blood, UA large (A) negative   pH, UA 5.5    Protein Ur, POC negative negative   Urobilinogen, UA 0.2    Nitrite, UA Negative Negative   Leukocytes, UA Negative Negative  Currently on menses.  Assessment & Plan:   Sherri Buckley is a 40 y.o. female Urinary frequency -  Plan: Basic metabolic panel, POCT glucose (manual entry), POCT Microscopic Urinalysis ([facility]), POCT urinalysis dipstick, phenazopyridine (PYRIDIUM) 100 MG tablet  - Previous urine culture reviewed, no sign of infection. Persistent symptoms, but no worsening, reassuring exam.  -  Stop Macrobid, trial of Pyridium for the next 3 days, handout given on other causes of urinary frequency. Avoid caffeine, avoid spicy foods, and if not improving by the end of this week, can refer to urology. Return to clinic sooner if any worsening symptoms.  Meds ordered this encounter  Medications  . phenazopyridine (PYRIDIUM) 100 MG tablet    Sig: Take 1 tablet (100 mg total) by mouth 3 (three) times daily as needed for pain.    Dispense:  10 tablet    Refill:  0   Patient Instructions       IF you received an x-ray today, you will receive an invoice from [facility]. Please contact [facility] at [phone] with questions or concerns regarding your invoice.   IF you received labwork today, you will receive an invoice from [employer]. Please contact [facility] at [phone] with questions or concerns regarding your invoice.   Our billing staff will not be able to assist you with questions regarding bills from these companies.  You will be contacted with the lab results as soon as they are available. The fastest way to get your results is to activate your  My Chart account. Instructions are located on the last page of this paperwork. If you have not heard from [place] regarding the results in 2 weeks, please contact this office.    See information below on causes of urinary frequency. You can try the Pyridium for the next couple of days to see if this improves your symptoms. If no improvement at the end of this week, let me know and I can refer you to a urologist. If any worsening sooner, including abdominal pain, fever, nausea, vomiting, or back pain, return for recheck here or the emergency room.    Urinary Frequency The number of times a normal person urinates depends upon how much liquid they take in and how much liquid they are losing. If the temperature is hot and there is high humidity, then the person will sweat more and usually breathe a little more frequently. These factors decrease the amount of frequency of urination that would be considered normal. The amount you drink is easily determined, but the amount of fluid lost is sometimes more difficult to calculate.  Fluid is lost in two ways:  Sensible fluid loss is usually measured by the amount of urine that you get rid of. Losses of fluid can also occur with diarrhea.  Insensible fluid loss is more difficult to measure. It is caused by evaporation. Insensible loss of fluid occurs through breathing and sweating. It usually ranges from a little less than a quart to a little more than a quart of fluid a day. In normal temperatures and activity levels, the average person may urinate 4 to 7 times in a 24-hour period. Needing to urinate more often than that could indicate a problem. If one urinates 4 to 7 times in 24 hours and has large volumes each time, that could indicate a different problem from one who urinates 4 to 7 times a day and has Buckley volumes. The time of urinating is also important. Most urinating should be done during the waking hours. Getting up at night to urinate frequently can  indicate some problems. CAUSES  The bladder is the organ in your lower abdomen that holds urine. Like a balloon, it swells some as it fills up. Your nerves sense this and tell you it is time to head for the bathroom. There are a number of reasons that you might feel the need to urinate more often than usual. They include:  Urinary tract infection. This is usually associated with other signs such as burning when you urinate.  In men, problems with the prostate (a walnut-size gland that is located near the tube that carries urine out of your body). There are two reasons why the prostate can cause an increased frequency of urination:  An enlarged prostate that does not let the bladder empty well. If the bladder only half empties when you urinate, then it only has half the capacity to fill before you have to urinate again.  The nerves in the bladder become more hypersensitive with an increased size of the prostate even if the bladder empties completely.  Pregnancy.  Obesity. Excess weight is more likely to cause a problem for women than for men.  Bladder stones or other bladder problems.  Caffeine.  Alcohol.  Medications. For example, drugs that help the body get rid of extra fluid (diuretics) increase urine production. Some other medicines must be taken with lots of fluids.  Muscle or nerve weakness. This might be the result of a spinal cord injury, a stroke, multiple sclerosis, or Parkinson disease.  Long-standing diabetes can decrease the sensation of the bladder. This loss of sensation makes it harder to sense the bladder needs to be emptied. Over a period of years, the bladder is stretched out by constant overfilling. This weakens the bladder muscles so that the bladder does not empty well and has less capacity to fill with new urine.  Interstitial cystitis (also called painful bladder syndrome). This condition develops because the tissues that line the inside of the bladder are inflamed  (inflammation is the body's way of reacting to injury or infection). It causes pain and frequent urination. It occurs in women more often than in men. DIAGNOSIS   To decide what might be causing your urinary frequency, your health care provider will probably:  Ask about symptoms you have noticed.  Ask about your overall health. This will include questions about any medications you are taking.  Do a physical examination.  Order some tests. These might include:  A blood test to check for diabetes or other health issues that could be contributing to the problem.  Urine testing. This could measure the flow of urine and the pressure on the bladder.  A test of your neurological system (the brain, spinal cord, and nerves). This is the system that senses the need to urinate.  A bladder test to check whether it is emptying completely when you urinate.  Cystoscopy. This test uses a thin tube with a tiny camera on it. It offers a look inside your urethra and bladder to see if there are problems.  Imaging tests. You might be given a contrast dye and then asked to urinate. X-rays are taken to see how your bladder is working. TREATMENT  It is important for you to be evaluated to determine if the amount or frequency that you have is unusual or abnormal. If it is found to be abnormal, the cause should be determined and this can usually be found out easily. Depending upon the cause, treatment could include medication, stimulation of the nerves, or surgery. There are not too many  things that you can do as an individual to change your urinary frequency. It is important that you balance the amount of fluid intake needed to compensate for your activity and the temperature. Medical problems will be diagnosed and taken care of by your physician. There is no particular bladder training such as Kegel exercises that you can do to help urinary frequency. This is an exercise that is usually recommended for people who  have leaking of urine when they laugh, cough, or sneeze. HOME CARE INSTRUCTIONS   Take any medications your health care provider prescribed or suggested. Follow the directions carefully.  Practice any lifestyle changes that are recommended. These might include:  Drinking less fluid or drinking at different times of the day. If you need to urinate often during the night, for example, you may need to stop drinking fluids early in the evening.  Cutting down on caffeine or alcohol. They both can make you need to urinate more often than normal. Caffeine is found in coffee, tea, and sodas.  Losing weight, if that is recommended.  Keep a journal or a log. You might be asked to record how much you drink and when and where you feel the need to urinate. This will also help evaluate how well the treatment provided by your physician is working. SEEK MEDICAL CARE IF:   Your need to urinate often gets worse.  You feel increased pain or irritation when you urinate.  You notice blood in your urine.  You have questions about any medications that your health care provider recommended.  You notice blood, pus, or swelling at the site of any test or treatment procedure.  You develop a fever of more than 100.85F (38.1C). SEEK IMMEDIATE MEDICAL CARE IF:  You develop a fever of more than 102.3F (38.9C).   This information is not intended to replace advice given to you by your health care provider. Make sure you discuss any questions you have with your health care provider.   Document Released: 07/20/2009 Document Revised: 10/14/2014 Document Reviewed: 07/20/2009 Elsevier Interactive Patient Education [employer].        I personally performed the services described in this documentation, which was scribed in my presence. The recorded information has been reviewed and considered, and addended by me as needed.

## 2016-02-26 NOTE — Patient Instructions (Addendum)
IF you received an x-ray today, you will receive an invoice from [facility]. Please contact [facility] at [phone] with questions or concerns regarding your invoice.   IF you received labwork today, you will receive an invoice from [employer]. Please contact [facility] at [phone] with questions or concerns regarding your invoice.   Our billing staff will not be able to assist you with questions regarding bills from these companies.  You will be contacted with the lab results as soon as they are available. The fastest way to get your results is to activate your My Chart account. Instructions are located on the last page of this paperwork. If you have not heard from [place] regarding the results in 2 weeks, please contact this office.    See information below on causes of urinary frequency. You can try the Pyridium for the next couple of days to see if this improves your symptoms. If no improvement at the end of this week, let me know and I can refer you to a urologist. If any worsening sooner, including abdominal pain, fever, nausea, vomiting, or back pain, return for recheck here or the emergency room.    Urinary Frequency The number of times a normal person urinates depends upon how much liquid they take in and how much liquid they are losing. If the temperature is hot and there is high humidity, then the person will sweat more and usually breathe a little more frequently. These factors decrease the amount of frequency of urination that would be considered normal. The amount you drink is easily determined, but the amount of fluid lost is sometimes more difficult to calculate.  Fluid is lost in two ways:  Sensible fluid loss is usually measured by the amount of urine that you get rid of. Losses of fluid can also occur with diarrhea.  Insensible fluid loss is more difficult to measure. It is caused by evaporation. Insensible loss of fluid  occurs through breathing and sweating. It usually ranges from a little less than a quart to a little more than a quart of fluid a day. In normal temperatures and activity levels, the average person may urinate 4 to 7 times in a 24-hour period. Needing to urinate more often than that could indicate a problem. If one urinates 4 to 7 times in 24 hours and has large volumes each time, that could indicate a different problem from one who urinates 4 to 7 times a day and has small volumes. The time of urinating is also important. Most urinating should be done during the waking hours. Getting up at night to urinate frequently can indicate some problems. CAUSES  The bladder is the organ in your lower abdomen that holds urine. Like a balloon, it swells some as it fills up. Your nerves sense this and tell you it is time to head for the bathroom. There are a number of reasons that you might feel the need to urinate more often than usual. They include:  Urinary tract infection. This is usually associated with other signs such as burning when you urinate.  In men, problems with the prostate (a walnut-size gland that is located near the tube that carries urine out of your body). There are two reasons why the prostate can cause an increased frequency of urination:  An enlarged prostate that does not let the bladder empty well. If the bladder only half empties when you urinate, then it only has half the capacity to  fill before you have to urinate again.  The nerves in the bladder become more hypersensitive with an increased size of the prostate even if the bladder empties completely.  Pregnancy.  Obesity. Excess weight is more likely to cause a problem for women than for men.  Bladder stones or other bladder problems.  Caffeine.  Alcohol.  Medications. For example, drugs that help the body get rid of extra fluid (diuretics) increase urine production. Some other medicines must be taken with lots of  fluids.  Muscle or nerve weakness. This might be the result of a spinal cord injury, a stroke, multiple sclerosis, or Parkinson disease.  Long-standing diabetes can decrease the sensation of the bladder. This loss of sensation makes it harder to sense the bladder needs to be emptied. Over a period of years, the bladder is stretched out by constant overfilling. This weakens the bladder muscles so that the bladder does not empty well and has less capacity to fill with new urine.  Interstitial cystitis (also called painful bladder syndrome). This condition develops because the tissues that line the inside of the bladder are inflamed (inflammation is the body's way of reacting to injury or infection). It causes pain and frequent urination. It occurs in women more often than in men. DIAGNOSIS   To decide what might be causing your urinary frequency, your health care provider will probably:  Ask about symptoms you have noticed.  Ask about your overall health. This will include questions about any medications you are taking.  Do a physical examination.  Order some tests. These might include:  A blood test to check for diabetes or other health issues that could be contributing to the problem.  Urine testing. This could measure the flow of urine and the pressure on the bladder.  A test of your neurological system (the brain, spinal cord, and nerves). This is the system that senses the need to urinate.  A bladder test to check whether it is emptying completely when you urinate.  Cystoscopy. This test uses a thin tube with a tiny camera on it. It offers a look inside your urethra and bladder to see if there are problems.  Imaging tests. You might be given a contrast dye and then asked to urinate. X-rays are taken to see how your bladder is working. TREATMENT  It is important for you to be evaluated to determine if the amount or frequency that you have is unusual or abnormal. If it is found to be  abnormal, the cause should be determined and this can usually be found out easily. Depending upon the cause, treatment could include medication, stimulation of the nerves, or surgery. There are not too many things that you can do as an individual to change your urinary frequency. It is important that you balance the amount of fluid intake needed to compensate for your activity and the temperature. Medical problems will be diagnosed and taken care of by your physician. There is no particular bladder training such as Kegel exercises that you can do to help urinary frequency. This is an exercise that is usually recommended for people who have leaking of urine when they laugh, cough, or sneeze. HOME CARE INSTRUCTIONS   Take any medications your health care provider prescribed or suggested. Follow the directions carefully.  Practice any lifestyle changes that are recommended. These might include:  Drinking less fluid or drinking at different times of the day. If you need to urinate often during the night, for example, you may  need to stop drinking fluids early in the evening.  Cutting down on caffeine or alcohol. They both can make you need to urinate more often than normal. Caffeine is found in coffee, tea, and sodas.  Losing weight, if that is recommended.  Keep a journal or a log. You might be asked to record how much you drink and when and where you feel the need to urinate. This will also help evaluate how well the treatment provided by your physician is working. SEEK MEDICAL CARE IF:   Your need to urinate often gets worse.  You feel increased pain or irritation when you urinate.  You notice blood in your urine.  You have questions about any medications that your health care provider recommended.  You notice blood, pus, or swelling at the site of any test or treatment procedure.  You develop a fever of more than 100.65F (38.1C). SEEK IMMEDIATE MEDICAL CARE IF:  You develop a fever of  more than 102.39F (38.9C).   This information is not intended to replace advice given to you by your health care provider. Make sure you discuss any questions you have with your health care provider.   Document Released: 07/20/2009 Document Revised: 10/14/2014 Document Reviewed: 07/20/2009 Elsevier Interactive Patient Education [employer].

## 2016-02-27 LAB — BASIC METABOLIC PANEL
BUN: 12 mg/dL (ref 7–25)
CALCIUM: 9.4 mg/dL (ref 8.6–10.2)
CO2: 26 mmol/L (ref 20–31)
CREATININE: 0.77 mg/dL (ref 0.50–1.10)
Chloride: 104 mmol/L (ref 98–110)
GLUCOSE: 90 mg/dL (ref 65–99)
Potassium: 4.5 mmol/L (ref 3.5–5.3)
Sodium: 140 mmol/L (ref 135–146)

## 2016-03-07 ENCOUNTER — Telehealth: Payer: Self-pay

## 2016-03-07 DIAGNOSIS — R350 Frequency of micturition: Secondary | ICD-10-CM

## 2016-03-07 NOTE — Telephone Encounter (Signed)
Pt is needing a urologist referral she saw greene recently and she stated that he told her if not better to call for this referral   Best number [phone]

## 2016-03-07 NOTE — Telephone Encounter (Signed)
[facility]

## 2016-03-19 ENCOUNTER — Encounter: Payer: Federal, State, Local not specified - PPO | Admitting: Family Medicine

## 2016-03-19 ENCOUNTER — Encounter: Payer: Self-pay | Admitting: Family Medicine

## 2016-03-19 ENCOUNTER — Ambulatory Visit (INDEPENDENT_AMBULATORY_CARE_PROVIDER_SITE_OTHER): Payer: Federal, State, Local not specified - PPO | Admitting: Family Medicine

## 2016-03-19 VITALS — BP 110/74 | HR 80 | Temp 98.4°F | Resp 16 | Ht 62.0 in | Wt 195.0 lb

## 2016-03-19 DIAGNOSIS — J4520 Mild intermittent asthma, uncomplicated: Secondary | ICD-10-CM | POA: Diagnosis not present

## 2016-03-19 DIAGNOSIS — R350 Frequency of micturition: Secondary | ICD-10-CM | POA: Diagnosis not present

## 2016-03-19 DIAGNOSIS — Z0000 Encounter for general adult medical examination without abnormal findings: Secondary | ICD-10-CM

## 2016-03-19 DIAGNOSIS — L659 Nonscarring hair loss, unspecified: Secondary | ICD-10-CM

## 2016-03-19 LAB — POCT URINALYSIS DIP (MANUAL ENTRY)
BILIRUBIN UA: NEGATIVE
Bilirubin, UA: NEGATIVE
Glucose, UA: NEGATIVE
LEUKOCYTES UA: NEGATIVE
Nitrite, UA: NEGATIVE
PH UA: 5.5
PROTEIN UA: NEGATIVE
RBC UA: NEGATIVE
SPEC GRAV UA: 1.02
Urobilinogen, UA: 0.2

## 2016-03-19 LAB — POC MICROSCOPIC URINALYSIS (UMFC): Mucus: ABSENT

## 2016-03-19 LAB — HEMOGLOBIN A1C
Hgb A1c MFr Bld: 4.8 % (ref <5.7)
MEAN PLASMA GLUCOSE: 91 mg/dL

## 2016-03-19 NOTE — Patient Instructions (Addendum)
Please see if you can stop using your albuterol inhaler and just be maintained on your QVAR  Please use your flonase daily  Can try a shampoo with Rogaine  Walk at least 2-3 times per week, look at HIIT- high intensity interval work out   [employer] These Tests 1. Blood Pressure- Have your blood pressure checked once a year by your health care provider.  Normal blood pressure is 120/80. 2. Weight- Have your body mass index (BMI) calculated to screen for obesity.  BMI is measure of body fat based on height and weight.  You can also calculate your own BMI at GravelBags.it. 3. Cholesterol- Have your cholesterol checked every 5 years starting at age 42 then yearly starting at age 42. 46. Chlamydia, HIV, and other sexually transmitted diseases- Get screened every year until age 42, then within three months of each new sexual provider. 5. Pap Test - Every 1-5 years; discuss with your health care provider. 6. Mammogram- Every 1-2 years starting at age 42--50  Take these medicines  Calcium with Vitamin D-Your body needs 1200 mg of Calcium each day and [phone] IU of Vitamin D daily.  Your body can only absorb 500 mg of Calcium at a time so Calcium must be taken in 2 or 3 divided doses throughout the day.  Multivitamin with folic acid- Once daily if it is possible for you to become pregnant.  Get these Immunizations  Gardasil-Series of three doses; prevents HPV related illness such as genital warts and cervical cancer.  Menactra-Single dose; prevents meningitis.  Tetanus shot- Every 10 years.  Flu shot-Every year.  Take these steps 1. Do not smoke-Your healthcare provider can help you quit.  For tips on how to quit go to www.smokefree.gov or call 1-800 QUITNOW. 2. Be physically active- Exercise 5 days a week for at least 30 minutes.  If you are not already physically active, start slow and gradually work up to 30 minutes of moderate physical activity.  Examples of  moderate activity include walking briskly, dancing, swimming, bicycling, etc. 3. Breast Cancer- A self breast exam every month is important for early detection of breast cancer.  For more information and instruction on self breast exams, ask your healthcare provider or https://www.patel.info/. 4. Eat a healthy diet- Eat a variety of healthy foods such as fruits, vegetables, whole grains, low fat milk, low fat cheeses, yogurt, lean meats, poultry and fish, beans, nuts, tofu, etc.  For more information go to www. Thenutritionsource.org 5. Drink alcohol in moderation- Limit alcohol intake to one drink or less per day. Never drink and drive. 6. Depression- Your emotional health is as important as your physical health.  If you're feeling down or losing interest in things you normally enjoy please talk to your healthcare provider about being screened for depression. 7. Dental visit- Brush and floss your teeth twice daily; visit your dentist twice a year. 8. Eye doctor- Get an eye exam at least every 2 years. 9. Helmet use- Always wear a helmet when riding a bicycle, motorcycle, rollerblading or skateboarding. 29. Safe sex- If you may be exposed to sexually transmitted infections, use a condom. 11. Seat belts- Seat belts can save your live; always wear one. 12. Smoke/Carbon Monoxide detectors- These detectors need to be installed on the appropriate level of your home. Replace batteries at least once a year. 13. Skin cancer- When out in the sun please cover up and use sunscreen 15 SPF or higher. 14. Violence- If anyone  is threatening or hurting you, please tell your healthcare provider.          IF you received an x-ray today, you will receive an invoice from [facility]. Please contact [facility] at [phone] with questions or concerns regarding your invoice.   IF you received labwork today, you will receive an invoice from [facility]. Please contact [facility] at [phone] with questions or concerns regarding your invoice.   Our billing staff will not be able to assist you with questions regarding bills from these companies.  You will be contacted with the lab results as soon as they are available. The fastest way to get your results is to activate your My Chart account. Instructions are located on the last page of this paperwork. If you have not heard from [place] regarding the results in 2 weeks, please contact this office.

## 2016-03-19 NOTE — Progress Notes (Signed)
Subjective:    Patient ID: Sherri Buckley, female    DOB: [birth year], 40 y.o.   MRN: [id number]  HPI This is a pleasant 40 yo female who presents today for CPE.   Last CPE- 02/13/15 Mammo- 08/24/15 Pap- 10/16, goes to gyn Sexual history- currently single, not in a relationship Tdap-02/13/15 Eye- regular Dental- regular Exercise- yoga, walking   Asthma currently well controlled  Has had problems with urinary frequency for the last two months. Had course of macrobid, urine culture negative. Tried pyridium with some relief of symptoms. Frequency seems to be improving slowly. She doesn't drink very much water throughout the day. No dysuria, no fever/chills/nausea/vomiting.    Past Medical History  Diagnosis Date  . Blood type, Rh negative   . Substance abuse   . Asthma   . Chronic bronchitis ([place])     "get it q yr"  . [id number])     "monthly" (12/21/2014)  . Osteoarthritis of ankle, right   . Seasonal allergies   . Allergy   . Depression 10/07/1998   Past Surgical History  Procedure Laterality Date  . Breast biopsy Right ~ 1993    "took out some benign tissue"   Family History  Problem Relation Age of Onset  . Hypertension Paternal Grandmother   . Diabetes Paternal Grandmother   . Heart disease Maternal Grandmother     9 bypass surgery  . Cancer Maternal Grandmother     mouth  . Sickle cell trait Father   . Seizures Father   . Alcohol abuse Father   . Thyroid disease Mother     as a child  . Hypertension Mother   . Anemia Mother   . Migraines Mother   . Hernia Mother   . Diabetes Mother   . Arthritis Maternal Aunt    Social History  Substance Use Topics  . Smoking status: Former Research scientist (life sciences)  . Smokeless tobacco: Never Used  . Alcohol Use: 0.0 oz/week    0 Standard drinks or equivalent per week     Comment: 12/21/2014 "quit drinking in 2013"      Review of Systems  Constitutional: Negative.   HENT: Negative.   Eyes: Negative.   Respiratory:  Negative.   Cardiovascular: Negative.   Gastrointestinal: Negative.   Endocrine: Positive for polyuria.  Genitourinary: Positive for frequency.  Musculoskeletal: Negative.   Skin: Negative.   Allergic/Immunologic: Negative.   Neurological: Negative.   Hematological: Negative.   Psychiatric/Behavioral: Negative.        Objective:   Physical Exam Physical Exam  Constitutional: She is oriented to person, place, and time. She appears well-developed and well-nourished. No distress.  HENT:  Head: Normocephalic and atraumatic.  Right Ear: External ear normal.  Left Ear: External ear normal.  Nose: Nose normal.  Mouth/Throat: Oropharynx is clear and moist. No oropharyngeal exudate.  Eyes: Conjunctivae are normal. Pupils are equal, round, and reactive to light.  Neck: Normal range of motion. Neck supple. No JVD present. No thyromegaly present.  Cardiovascular: Normal rate, regular rhythm, normal heart sounds and intact distal pulses.   Pulmonary/Chest: Effort normal and breath sounds normal. Right breast exhibits no inverted nipple, no mass, no nipple discharge, no skin change and no tenderness. Left breast exhibits no inverted nipple, no mass, no nipple discharge, no skin change and no tenderness. Breasts are symmetrical.  Abdominal: Soft. Bowel sounds are normal. She exhibits no distension and no mass. There is no tenderness. There is no rebound and no guarding.  Musculoskeletal: Normal range of motion. She exhibits no edema or tenderness.  Lymphadenopathy:    She has no cervical adenopathy.  Neurological: She is alert and oriented to person, place, and time. She has normal reflexes.  Skin: Skin is warm and dry. She is not diaphoretic.  Psychiatric: She has a normal mood and affect. Her behavior is normal. Judgment and thought content normal.  Vitals reviewed.  BP 110/74 mmHg  Pulse 80  Temp(Src) 98.4 F (36.9 C) (Oral)  Resp 16  Ht 5\' 2"  (1.575 m)  Wt 195 lb (88.451 kg)  BMI  35.66 kg/m2  LMP 02/23/2016 Wt Readings from Last 3 Encounters:  03/19/16 195 lb (88.451 kg)  02/26/16 196 lb (88.905 kg)  02/21/16 196 lb (88.905 kg)       Assessment & Plan:  1. Annual physical exam - Discussed and encouraged healthy lifestyle choices- adequate sleep, regular exercise, stress management and healthy food choices.   2. Asthma, mild intermittent, uncomplicated - currently well controlled  3. Urinary frequency - urine unremarkable, slowly improving, discussed increasing water intake, regular voiding schedule, she will let me know if she wants to go to urology - POCT urinalysis dipstick - POCT Microscopic Urinalysis ([facility]) - Hemoglobin A1c  4. Hair loss - Ambulatory referral to Dermatology   Clarene Reamer, FNP-BC  [facility]  03/23/2016 10:43 AM

## 2016-03-25 DIAGNOSIS — M19071 Primary osteoarthritis, right ankle and foot: Secondary | ICD-10-CM | POA: Diagnosis not present

## 2016-04-15 ENCOUNTER — Encounter (HOSPITAL_COMMUNITY): Payer: Self-pay

## 2016-04-15 ENCOUNTER — Emergency Department (HOSPITAL_COMMUNITY)
Admission: EM | Admit: 2016-04-15 | Discharge: 2016-04-15 | Disposition: A | Discharge status: Home / Self Care | Payer: Federal, State, Local not specified - PPO | Attending: Emergency Medicine | Admitting: Emergency Medicine

## 2016-04-15 DIAGNOSIS — Z87891 Personal history of nicotine dependence: Secondary | ICD-10-CM | POA: Diagnosis not present

## 2016-04-15 DIAGNOSIS — R209 Unspecified disturbances of skin sensation: Secondary | ICD-10-CM | POA: Insufficient documentation

## 2016-04-15 DIAGNOSIS — J45909 Unspecified asthma, uncomplicated: Secondary | ICD-10-CM | POA: Insufficient documentation

## 2016-04-15 DIAGNOSIS — R202 Paresthesia of skin: Secondary | ICD-10-CM | POA: Diagnosis not present

## 2016-04-15 LAB — CBC
HEMATOCRIT: 38.5 % (ref 36.0–46.0)
HEMOGLOBIN: 13.6 g/dL (ref 12.0–15.0)
MCH: 26.7 pg (ref 26.0–34.0)
MCHC: 35.3 g/dL (ref 30.0–36.0)
MCV: 75.5 fL — AB (ref 78.0–100.0)
PLATELETS: 329 10*3/uL (ref 150–400)
RBC: 5.1 MIL/uL (ref 3.87–5.11)
RDW: 14.2 % (ref 11.5–15.5)
WBC: 8.2 10*3/uL (ref 4.0–10.5)

## 2016-04-15 LAB — URINE MICROSCOPIC-ADD ON

## 2016-04-15 LAB — BASIC METABOLIC PANEL
Anion gap: 7 (ref 5–15)
BUN: 14 mg/dL (ref 6–20)
CHLORIDE: 106 mmol/L (ref 101–111)
CO2: 24 mmol/L (ref 22–32)
CREATININE: 0.88 mg/dL (ref 0.44–1.00)
Calcium: 8.9 mg/dL (ref 8.9–10.3)
GFR calc non Af Amer: >60 mL/min (ref >60)
Glucose, Bld: 92 mg/dL (ref 65–99)
POTASSIUM: 3.8 mmol/L (ref 3.5–5.1)
Sodium: 137 mmol/L (ref 135–145)

## 2016-04-15 LAB — URINALYSIS, ROUTINE W REFLEX MICROSCOPIC
Bilirubin Urine: NEGATIVE
GLUCOSE, UA: NEGATIVE mg/dL
Ketones, ur: 15 mg/dL — AB
Nitrite: NEGATIVE
PH: 6 (ref 5.0–8.0)
Protein, ur: 30 mg/dL — AB
SPECIFIC GRAVITY, URINE: 1.018 (ref 1.005–1.030)

## 2016-04-15 LAB — I-STAT BETA HCG BLOOD, ED (MC, WL, AP ONLY): I-stat hCG, quantitative: <5 m[IU]/mL (ref <5)

## 2016-04-15 NOTE — ED Notes (Signed)
Patient here with head tingling, left eye twitching, none noted and also some tingling to left side of mouth. Patient states that these symptoms started on Saturday. No neuro deficits, alert and oriented, denies pain

## 2016-04-15 NOTE — Discharge Instructions (Signed)
Please follow-up with your neurologist for further evaluation and treatment of your symptoms. Avoid caffeine and alcohol and try to participate in stress relieving activities to decrease the stress. A culture will be sent your of urine and you will be called in 2-3 days if any bacteria grows any need antibiotics. Please return to the emergency department if you develop any new or worsening symptoms.   Paresthesia Paresthesia is an abnormal burning or prickling sensation. This sensation is generally felt in the hands, arms, legs, or feet. However, it may occur in any part of the body. Usually, it is not painful. The feeling may be described as:  Tingling or numbness.  Pins and needles.  Skin crawling.  Buzzing.  Limbs falling asleep.  Itching. Most people experience temporary (transient) paresthesia at some time in their lives. Paresthesia may occur when you breathe too quickly (hyperventilation). It can also occur without any apparent cause. Commonly, paresthesia occurs when pressure is placed on a nerve. The sensation quickly goes away after the pressure is removed. For some people, however, paresthesia is a long-lasting (chronic) condition that is caused by an underlying disorder. If you continue to have paresthesia, you may need further medical evaluation. HOME CARE INSTRUCTIONS Watch your condition for any changes. Taking the following actions may help to lessen any discomfort that you are feeling:  Avoid drinking alcohol.  Try acupuncture or massage to help relieve your symptoms.  Keep all follow-up visits as directed by your health care provider. This is important. SEEK MEDICAL CARE IF:  You continue to have episodes of paresthesia.  Your burning or prickling feeling gets worse when you walk.  You have pain, cramps, or dizziness.  You develop a rash. SEEK IMMEDIATE MEDICAL CARE IF:  You feel weak.  You have trouble walking or moving.  You have problems with speech,  understanding, or vision.  You feel confused.  You cannot control your bladder or bowel movements.  You have numbness after an injury.  You faint.   This information is not intended to replace advice given to you by your health care provider. Make sure you discuss any questions you have with your health care provider.   Document Released: 09/13/2002 Document Revised: 02/07/2015 Document Reviewed: 09/19/2014 Elsevier Interactive Patient Education [employer].

## 2016-04-15 NOTE — ED Provider Notes (Signed)
CSN: [id number]     Arrival date & time 04/15/16  0734 History   First MD Initiated Contact with Patient 04/15/16 0801     Chief Complaint  Patient presents with  . head tingling      (Consider location/radiation/quality/duration/timing/severity/associated sxs/prior Treatment) HPI Comments: Patient is a 40 year old female who presents with paresthesias to her bilateral parietal head, left eye, left side of lip since Saturday. Patient states she has experienced symptoms like this in the past. However, she has never experienced twitching to her eye in the past and she is concerned about a stroke. Patient has been evaluated by neurology and has had 3 negative MRIs in the past 3 years. Patient states her symptoms started Saturday after a bad argument. Patient states her symptoms were the worst on Saturday and improved on Sunday and became intermittent, however they are constant and again today. Patient denies she is not in any pain and denies headache. She also denies any numbness, tingling anywhere else in her body. She also denies any weakness, dizziness, lightheadedness. Patient denies any excessive caffeine intake. She states she has 2 shots of espresso every Saturday, but does not drink caffeine throughout the rest of the week. Patient denies any chest pain, shortness of breath, abdominal pain, nausea, vomiting, dysuria. Patient states she has been seen by her primary care provider for urinary frequency and initiated treatment for UTI 2 weeks ago, however culture was negative. She has a referral to follow up with urology.  The history is provided by the patient.    Past Medical History  Diagnosis Date  . Blood type, Rh negative   . Substance abuse   . Asthma   . Chronic bronchitis ([place])     "get it q yr"  . [id number])     "monthly" (12/21/2014)  . Osteoarthritis of ankle, right   . Seasonal allergies   . Allergy   . Depression 10/07/1998   Past Surgical History  Procedure Laterality  Date  . Breast biopsy Right ~ 1993    "took out some benign tissue"   Family History  Problem Relation Age of Onset  . Hypertension Paternal Grandmother   . Diabetes Paternal Grandmother   . Heart disease Maternal Grandmother     9 bypass surgery  . Cancer Maternal Grandmother     mouth  . Sickle cell trait Father   . Seizures Father   . Alcohol abuse Father   . Thyroid disease Mother     as a child  . Hypertension Mother   . Anemia Mother   . Migraines Mother   . Hernia Mother   . Diabetes Mother   . Arthritis Maternal Aunt    Social History  Substance Use Topics  . Smoking status: Former Research scientist (life sciences)  . Smokeless tobacco: Never Used  . Alcohol Use: 0.0 oz/week    0 Standard drinks or equivalent per week     Comment: 12/21/2014 "quit drinking in 2013"   OB History    Gravida Para Term Preterm AB TAB SAB Ectopic Multiple Living   9 0   8  1   0     Review of Systems  Constitutional: Negative for fever and chills.  HENT: Negative for facial swelling and sore throat.   Respiratory: Negative for shortness of breath.   Cardiovascular: Negative for chest pain.  Gastrointestinal: Negative for nausea, vomiting and abdominal pain.  Genitourinary: Negative for dysuria.  Musculoskeletal: Negative for back pain.  Skin: Negative for rash and  wound.  Neurological: Negative for dizziness, facial asymmetry, weakness, light-headedness, numbness and headaches.  Psychiatric/Behavioral: The patient is not nervous/anxious.       Allergies  Review of patient's allergies indicates no known allergies.  Home Medications   Prior to Admission medications   Medication Sig Start Date End Date Taking? Authorizing Provider  beclomethasone (QVAR) 40 MCG/ACT inhaler Inhale 2 puffs into the lungs 2 (two) times daily. 12/19/15  Yes Wardell Honour, MD  Multiple Vitamin (MULTIVITAMIN WITH MINERALS) TABS Take 1 tablet by mouth daily.   Yes Historical Provider, MD   BP 126/82 mmHg  Pulse 77   Temp(Src) 98.4 F (36.9 C) (Oral)  Resp 18  Ht 5' (1.524 m)  Wt 86.183 kg  BMI 37.11 kg/m2  SpO2 99%  LMP 02/23/2016 Physical Exam  Constitutional: She appears well-developed and well-nourished. No distress.  HENT:  Head: Normocephalic and atraumatic.  Mouth/Throat: Oropharynx is clear and moist. No oropharyngeal exudate.  Eyes: Conjunctivae and EOM are normal. Pupils are equal, round, and reactive to light. Right eye exhibits no discharge. Left eye exhibits no discharge. No scleral icterus.  Neck: Normal range of motion. Neck supple. No thyromegaly present.  Cardiovascular: Normal rate, regular rhythm, normal heart sounds and intact distal pulses.  Exam reveals no gallop and no friction rub.   No murmur heard. Pulmonary/Chest: Effort normal and breath sounds normal. No stridor. No respiratory distress. She has no wheezes. She has no rales.  Abdominal: Soft. Bowel sounds are normal. She exhibits no distension. There is no tenderness. There is no rebound and no guarding.  Musculoskeletal: She exhibits no edema.  Lymphadenopathy:    She has no cervical adenopathy.  Neurological: She is alert. She displays a negative Romberg sign. Coordination normal.  Reflex Scores:      Patellar reflexes are 2+ on the right side and 2+ on the left side. CN 3-12 intact; normal sensation throughout; 5/5 strength in all 4 extremities; equal bilateral grip strength; no ataxia on finger to nose; no pronator drift   Skin: Skin is warm and dry. No rash noted. She is not diaphoretic. No pallor.  Psychiatric: She has a normal mood and affect.  Nursing note and vitals reviewed.   ED Course  Procedures (including critical care time) Labs Review Labs Reviewed  CBC - Abnormal; Notable for the following:    MCV 75.5 (*)    All other components within normal limits  URINALYSIS, ROUTINE W REFLEX MICROSCOPIC (NOT AT [facility]) - Abnormal; Notable for the following:    Color, Urine RED (*)    APPearance CLOUDY (*)     Hgb urine dipstick LARGE (*)    Ketones, ur 15 (*)    Protein, ur 30 (*)    Leukocytes, UA MODERATE (*)    All other components within normal limits  URINE MICROSCOPIC-ADD ON - Abnormal; Notable for the following:    Squamous Epithelial / LPF 0-5 (*)    Bacteria, UA RARE (*)    All other components within normal limits  BASIC METABOLIC PANEL  I-STAT BETA HCG BLOOD, ED ([facility], [facility], [facility] ONLY)    Imaging Review No results found. I have personally reviewed and evaluated these images and lab results as part of my medical decision-making.   EKG Interpretation None      MDM   Patient presenting with similar paresthesias that she is experiencing the past. Patient has seen neurology and has had 3 negative MRIs in the past. Normal neuro exam without focal  deficits. CBC, BMP unremarkable. UA shows large hematuria (patient on initial cycle), 15 ketones, 30 protein, moderate leukocytes, rare bacteria. Patient has referral to urology for her urinary frequency. Urine culture to be sent and would treat if culture positive. Patient to follow-up with her neurologist. Patient understands and agrees with plan. Strict return precautions discussed. Patient also evaluated by Dr. Ralene Bathe who is in agreement with plan. Patient vitals stable throughout ED course and discharged in satisfactory condition.  Final diagnoses:  Paresthesias       Frederica Kuster, PA-C 04/15/16 [id number]  Quintella Reichert, MD 04/15/16 [phone]

## 2016-04-21 ENCOUNTER — Encounter (HOSPITAL_COMMUNITY): Payer: Self-pay

## 2016-04-21 ENCOUNTER — Emergency Department (HOSPITAL_COMMUNITY)
Admission: EM | Admit: 2016-04-21 | Discharge: 2016-04-21 | Disposition: A | Discharge status: Home / Self Care | Payer: Federal, State, Local not specified - PPO | Attending: Emergency Medicine | Admitting: Emergency Medicine

## 2016-04-21 DIAGNOSIS — F419 Anxiety disorder, unspecified: Secondary | ICD-10-CM | POA: Diagnosis not present

## 2016-04-21 DIAGNOSIS — J45909 Unspecified asthma, uncomplicated: Secondary | ICD-10-CM | POA: Diagnosis not present

## 2016-04-21 DIAGNOSIS — Z87891 Personal history of nicotine dependence: Secondary | ICD-10-CM | POA: Insufficient documentation

## 2016-04-21 DIAGNOSIS — R531 Weakness: Secondary | ICD-10-CM | POA: Diagnosis present

## 2016-04-21 DIAGNOSIS — Z79899 Other long term (current) drug therapy: Secondary | ICD-10-CM | POA: Diagnosis not present

## 2016-04-21 LAB — URINALYSIS, ROUTINE W REFLEX MICROSCOPIC
Bilirubin Urine: NEGATIVE
GLUCOSE, UA: NEGATIVE mg/dL
HGB URINE DIPSTICK: NEGATIVE
Ketones, ur: 40 mg/dL — AB
LEUKOCYTES UA: NEGATIVE
Nitrite: NEGATIVE
Protein, ur: NEGATIVE mg/dL
SPECIFIC GRAVITY, URINE: 1.024 (ref 1.005–1.030)
pH: 5.5 (ref 5.0–8.0)

## 2016-04-21 LAB — CBC
HCT: 37.4 % (ref 36.0–46.0)
HEMOGLOBIN: 13.2 g/dL (ref 12.0–15.0)
MCH: 26.6 pg (ref 26.0–34.0)
MCHC: 35.3 g/dL (ref 30.0–36.0)
MCV: 75.4 fL — AB (ref 78.0–100.0)
Platelets: 359 10*3/uL (ref 150–400)
RBC: 4.96 MIL/uL (ref 3.87–5.11)
RDW: 13.8 % (ref 11.5–15.5)
WBC: 9.3 10*3/uL (ref 4.0–10.5)

## 2016-04-21 LAB — BASIC METABOLIC PANEL
ANION GAP: 8 (ref 5–15)
BUN: 12 mg/dL (ref 6–20)
CO2: 21 mmol/L — AB (ref 22–32)
Calcium: 9.5 mg/dL (ref 8.9–10.3)
Chloride: 106 mmol/L (ref 101–111)
Creatinine, Ser: 0.77 mg/dL (ref 0.44–1.00)
GFR calc Af Amer: >60 mL/min (ref >60)
GFR calc non Af Amer: >60 mL/min (ref >60)
GLUCOSE: 93 mg/dL (ref 65–99)
Potassium: 3.8 mmol/L (ref 3.5–5.1)
Sodium: 135 mmol/L (ref 135–145)

## 2016-04-21 MED ORDER — ALPRAZOLAM 0.25 MG PO TABS: 0.2500 mg | ORAL_TABLET | Freq: Two times a day (BID) | ORAL | Status: DC | PRN

## 2016-04-21 NOTE — Discharge Instructions (Signed)

## 2016-04-21 NOTE — ED Provider Notes (Signed)
CSN: [id number]     Arrival date & time 04/21/16  1615 History   First MD Initiated Contact with Patient 04/21/16 2059     Chief Complaint  Patient presents with  . Weakness      HPI Pt reports she was grocery shopping and began to feel faint. Pt has hx of parasthesis and PCP told her to come to ED if she felt faint. Pt denies symptoms at this time. Pt is a&o X4. No neurological deficits noted. Past Medical History  Diagnosis Date  . Blood type, Rh negative   . Substance abuse   . Asthma   . Chronic bronchitis ([place])     "get it q yr"  . [id number])     "monthly" (12/21/2014)  . Osteoarthritis of ankle, right   . Seasonal allergies   . Allergy   . Depression 10/07/1998   Past Surgical History  Procedure Laterality Date  . Breast biopsy Right ~ 1993    "took out some benign tissue"   Family History  Problem Relation Age of Onset  . Hypertension Paternal Grandmother   . Diabetes Paternal Grandmother   . Heart disease Maternal Grandmother     9 bypass surgery  . Cancer Maternal Grandmother     mouth  . Sickle cell trait Father   . Seizures Father   . Alcohol abuse Father   . Thyroid disease Mother     as a child  . Hypertension Mother   . Anemia Mother   . Migraines Mother   . Hernia Mother   . Diabetes Mother   . Arthritis Maternal Aunt    Social History  Substance Use Topics  . Smoking status: Former Research scientist (life sciences)  . Smokeless tobacco: Never Used  . Alcohol Use: 0.0 oz/week    0 Standard drinks or equivalent per week     Comment: 12/21/2014 "quit drinking in 2013"   OB History    Gravida Para Term Preterm AB TAB SAB Ectopic Multiple Living   9 0   8  1   0     Review of Systems  All other systems reviewed and are negative  Allergies  Review of patient's allergies indicates no known allergies.  Home Medications   Prior to Admission medications   Medication Sig Start Date End Date Taking? Authorizing Provider  albuterol (PROVENTIL HFA;VENTOLIN HFA) 108  (90 Base) MCG/ACT inhaler Inhale 2 puffs into the lungs every 6 (six) hours as needed for wheezing or shortness of breath.   Yes Historical Provider, MD  beclomethasone (QVAR) 40 MCG/ACT inhaler Inhale 2 puffs into the lungs 2 (two) times daily. 12/19/15  Yes Wardell Honour, MD  ibuprofen (ADVIL,MOTRIN) 200 MG tablet Take 200 mg by mouth daily as needed for headache.   Yes Historical Provider, MD  Multiple Vitamin (MULTIVITAMIN WITH MINERALS) TABS Take 1 tablet by mouth daily.   Yes Historical Provider, MD  naproxen sodium (ALEVE) 220 MG tablet Take 220 mg by mouth daily as needed (arthritis pain).   Yes Historical Provider, MD  ALPRAZolam (XANAX) 0.25 MG tablet Take 1 tablet (0.25 mg total) by mouth 2 (two) times daily as needed for anxiety. 04/21/16   Leonard Schwartz, MD   BP 118/65 mmHg  Pulse 106  Temp(Src) 98.9 F (37.2 C) (Oral)  Resp 14  Ht 5' (1.524 m)  Wt 190 lb (86.183 kg)  BMI 37.11 kg/m2  SpO2 100%  LMP 04/14/2016 (Within Days) Physical Exam Physical Exam  Nursing note and vitals  reviewed. Constitutional: She is oriented to person, place, and time. She appears well-developed and well-nourished. No distress.  HENT:  Head: Normocephalic and atraumatic.  Eyes: Pupils are equal, round, and reactive to light.  Neck: Normal range of motion.  Cardiovascular: Normal rate and intact distal pulses.   Pulmonary/Chest: No respiratory distress.  Abdominal: Normal appearance. She exhibits no distension.  Musculoskeletal: Normal range of motion.  Neurological: She is alert and oriented to person, place, and time. No cranial nerve deficit.  No lateralizing weakness.  No speech difficulty.  No sensory deficit.  GCS = 15 .  No cerebellar findings. Skin: Skin is warm and dry. No rash noted.  Psychiatric: She has a normal mood and affect. Her behavior is normal.   ED Course  Procedures (including critical care time) Labs Review Labs Reviewed  BASIC METABOLIC PANEL - Abnormal; Notable for the  following:    CO2 21 (*)    All other components within normal limits  CBC - Abnormal; Notable for the following:    MCV 75.4 (*)    All other components within normal limits  URINALYSIS, ROUTINE W REFLEX MICROSCOPIC (NOT AT [facility]) - Abnormal; Notable for the following:    Ketones, ur 40 (*)    All other components within normal limits  CBG MONITORING, ED    Imaging Review No results found. I have personally reviewed and evaluated these images and lab results as part of my medical decision-making.   EKG Interpretation   Date/Time:  Sunday April 21 2016 16:29:08 EDT Ventricular Rate:  97 PR Interval:  148 QRS Duration: 70 QT Interval:  364 QTC Calculation: 462 R Axis:   64 Text Interpretation:  Normal sinus rhythm with sinus arrhythmia Normal ECG  Confirmed by Britiany Silbernagel  MD, Tonni Mansour ([id number]) on 04/21/2016 9:56:56 PM     Patient exam and story are more consistent with panic or anxiety attacks.  She's had 3 normal MRIs done in the last 2 years.  Patient is stable for discharge. MDM   Final diagnoses:  Anxiety disorder, unspecified anxiety disorder type        Leonard Schwartz, MD 04/21/16 2227

## 2016-04-21 NOTE — ED Notes (Signed)
Pt reports she was grocery shopping and began to feel faint. Pt has hx of parasthesis and PCP told her to come to ED if she felt faint. Pt denies symptoms at this time. Pt is a&o X4. No neurological deficits noted.

## 2016-04-21 NOTE — ED Notes (Signed)
Onset 3pm while at [employer] and in ED triage room, head felt "prickly" and she got dizzy, lasted few seconds. No other s/s noted.

## 2016-04-29 ENCOUNTER — Ambulatory Visit (INDEPENDENT_AMBULATORY_CARE_PROVIDER_SITE_OTHER): Payer: Federal, State, Local not specified - PPO | Admitting: Neurology

## 2016-04-29 ENCOUNTER — Encounter: Payer: Self-pay | Admitting: Neurology

## 2016-04-29 VITALS — BP 110/70 | HR 80 | Ht 60.0 in | Wt 191.4 lb

## 2016-04-29 DIAGNOSIS — F411 Generalized anxiety disorder: Secondary | ICD-10-CM

## 2016-04-29 DIAGNOSIS — R202 Paresthesia of skin: Secondary | ICD-10-CM

## 2016-04-29 DIAGNOSIS — R209 Unspecified disturbances of skin sensation: Secondary | ICD-10-CM

## 2016-04-29 NOTE — Progress Notes (Signed)
Follow-up Visit   Date: 04/29/16    Sherri Buckley MRN: [id number] DOB: March 10, 1976   Interim History: Sherri Buckley is a 40 y.o.  right-handed African American female with asthma, seasonal allergies, and previous alcoholic dependence returning to the clinic for follow-up of generalized tingling and lightheadedness.  The patient was accompanied to the clinic by self.  History of present illness: Starting in 2014, she experienced numbness/tingling involving the entire body.  She had MRI brain which was normal and symptoms resolved without any intervention.  She was doing well until June 2016, when she started experiencing chilling and tingling sensation of the left calf and now has it over the entire face, especially left cheek.  She also feels pulsing sensation over sporadic location on her body.  Symptoms are all intermittent and does not have any identifiable triggers.    She has occasional headaches.  She mild intermittent pulsing of the right eye.  Denies vision changes, swallowing/talking difficulty, or weakness.  She is experiencing more memory and word-finding problems. She is very worried that she has multiple sclerosis.   She is stressed because she is single and tends to keep to herself.    She previously used to drink a pint of liquor during the weekdays and weekends for 15 years, which she quit in 2013.  She drinks occasionally on the weekends now, usually a bottle of wine.    UPDATE 04/29/2016: Two weeks ago, she began experiencing tingling over the face and head and muscle twitches.  She was seen in the ED on July 10th for these symptoms.  Several days later, she developed lightheadedness while grocery shopping so went to the ED again on July 17th.  She was given a prescription for anxiety, but she has not picked it up yet.  She recalls getting into a bad argument with her nephew's mother when symptoms started two weeks ago.  She does not have headaches or weakness.   If she is busy, she does not notice it as much.  She is very concerned about symptoms because she does not feel like herself and is worried something is affecting her brain.   Medications:  Current Outpatient Prescriptions on File Prior to Visit  Medication Sig Dispense Refill  . albuterol (PROVENTIL HFA;VENTOLIN HFA) 108 (90 Base) MCG/ACT inhaler Inhale 2 puffs into the lungs every 6 (six) hours as needed for wheezing or shortness of breath.    . beclomethasone (QVAR) 40 MCG/ACT inhaler Inhale 2 puffs into the lungs 2 (two) times daily. 1 Inhaler 12  . ibuprofen (ADVIL,MOTRIN) 200 MG tablet Take 200 mg by mouth daily as needed for headache.    . Multiple Vitamin (MULTIVITAMIN WITH MINERALS) TABS Take 1 tablet by mouth daily.    . naproxen sodium (ALEVE) 220 MG tablet Take 220 mg by mouth daily as needed (arthritis pain).     Current Facility-Administered Medications on File Prior to Visit  Medication Dose Route Frequency Provider Last Rate Last Dose  . albuterol (PROVENTIL) (2.5 MG/3ML) 0.083% nebulizer solution 2.5 mg  2.5 mg Nebulization Once Wardell Honour, MD        Allergies: No Known Allergies  Review of Systems:  CONSTITUTIONAL: No fevers, chills, night sweats, or weight loss.  EYES: No visual changes or eye pain ENT: No hearing changes.  No history of nose bleeds.   RESPIRATORY: No cough, wheezing and shortness of breath.   CARDIOVASCULAR: Negative for chest pain, and palpitations.   GI: Negative  for abdominal discomfort, blood in stools or black stools.  No recent change in bowel habits.   GU:  No history of incontinence.   MUSCLOSKELETAL: No history of joint pain or swelling.  No myalgias.   SKIN: Negative for lesions, rash, and itching.   ENDOCRINE: Negative for cold or heat intolerance, polydipsia or goiter.   PSYCH:  + depression or anxiety symptoms.   NEURO: As Above.   Vital Signs:  BP 110/70   Pulse 80   Ht 5' (1.524 m)   Wt 191 lb 7 oz (86.8 kg)   LMP  04/14/2016 (Within Days)   SpO2 99%   BMI 37.39 kg/m   Neurological Exam: MENTAL STATUS including orientation to time, place, person, recent and remote memory, attention span and concentration, language, and fund of knowledge is normal.  Speech is not dysarthric.  CRANIAL NERVES: Pupils equal round and reactive to light.  Normal conjugate, extra-ocular eye movements in all directions of gaze.  No ptosis. Normal facial sensation.  Face is symmetric. Palate elevates symmetrically.  Tongue is midline.  MOTOR:  Motor strength is 5/5 in all extremities.  No atrophy, fasciculations or abnormal movements.  No pronator drift.  Tone is normal.    MSRs:  Reflexes are 2+/4 throughout, except 1+/4 in the lower extremities  SENSORY:  Intact to temperature and vibration throughout.  COORDINATION/GAIT:  Normal finger-to- nose-finger and heel-to-shin.  Intact rapid alternating movements bilaterally.  Gait narrow based and stable.  Tandem gait intact.  Data: MRI brain wo contrast 05/08/2013: 1. Extensive anterior paranasal sinusitis. 2. Normal MRI appearance of the brain.  MRI brain wo contrast 02/11/2013: Equivocal MRI brain (without) demonstrating: 1. Bilateral maxillary and ethmoid sinus disease. There is complete opacification of the left maxillary sinus with fluid and bubbly secretions, air fluid level in the right maxillary sinus, and inflammation and fluid ethmoid sinuses. 2. Otherwise normal brain parenchyma.  MRI brain wwo contrast 05/29/2015:   Normal  Lab Results  Component Value Date   TSH 2.68 12/19/2015   Lab Results  Component Value Date   VITAMINB12 1,015 (H) 05/05/2015    IMPRESSION/PLAN: Ms. Howey is a 40 year-old female returning for follow-up with various neurological symptoms including head tingling, left eye twitching, and lightheadedness.  Her exam remains non-focal.  I discussed at length that with a normal neurological exam and three previous normal MRI of the brain,  the likelihood of anything worrisome is low and these are most likely a manifestation of stress reaction.  Patient is very distressed by symptoms and requesting MRI brain.  I do not feel that she needs any more imaging, but it does not seem that she will have peace of mind without this, therefore, I will order MRI brain wwo contrast and hope that insurance will approve it.  More importantly, she needs to have her anxiety addressed and is agreeable to seeing behavior therapy.   The duration of this appointment visit was 30 minutes of face-to-face time with the patient.  Greater than 50% of this time was spent in counseling, explanation of diagnosis, planning of further management, and coordination of care.   Thank you for allowing me to participate in patient's care.  If I can answer any additional questions, I would be pleased to do so.    Sincerely,    Donika K. Posey Pronto, DO

## 2016-04-29 NOTE — Patient Instructions (Addendum)
1.  MRI brain  2.  Referral to behavior therapy for coping mechanisms 3.  Follow-up with your primary care provider for anxiety

## 2016-04-30 ENCOUNTER — Encounter (HOSPITAL_COMMUNITY): Payer: Self-pay

## 2016-05-02 ENCOUNTER — Ambulatory Visit
Admission: RE | Admit: 2016-05-02 | Discharge: 2016-05-02 | Disposition: A | Discharge status: Home / Self Care | Payer: Federal, State, Local not specified - PPO | Source: Ambulatory Visit | Attending: Neurology | Admitting: Neurology

## 2016-05-02 DIAGNOSIS — R209 Unspecified disturbances of skin sensation: Secondary | ICD-10-CM

## 2016-05-02 DIAGNOSIS — R200 Anesthesia of skin: Secondary | ICD-10-CM | POA: Diagnosis not present

## 2016-05-02 DIAGNOSIS — F411 Generalized anxiety disorder: Secondary | ICD-10-CM

## 2016-05-02 DIAGNOSIS — R202 Paresthesia of skin: Secondary | ICD-10-CM

## 2016-05-02 MED ORDER — GADOBENATE DIMEGLUMINE 529 MG/ML IV SOLN
19.0000 mL | Freq: Once | INTRAVENOUS | Status: DC | PRN
Start: 2016-05-02 — End: 2016-05-03

## 2016-05-03 ENCOUNTER — Encounter: Payer: Self-pay | Admitting: *Deleted

## 2016-05-06 ENCOUNTER — Other Ambulatory Visit: Payer: Federal, State, Local not specified - PPO

## 2016-05-09 ENCOUNTER — Other Ambulatory Visit: Payer: Federal, State, Local not specified - PPO

## 2016-05-28 DIAGNOSIS — R350 Frequency of micturition: Secondary | ICD-10-CM | POA: Diagnosis not present

## 2016-05-28 DIAGNOSIS — N3281 Overactive bladder: Secondary | ICD-10-CM | POA: Diagnosis not present

## 2016-05-30 ENCOUNTER — Ambulatory Visit: Payer: Federal, State, Local not specified - PPO | Admitting: Neurology

## 2016-06-06 ENCOUNTER — Ambulatory Visit (INDEPENDENT_AMBULATORY_CARE_PROVIDER_SITE_OTHER): Payer: Federal, State, Local not specified - PPO | Admitting: Family Medicine

## 2016-06-06 ENCOUNTER — Ambulatory Visit (INDEPENDENT_AMBULATORY_CARE_PROVIDER_SITE_OTHER): Payer: Federal, State, Local not specified - PPO

## 2016-06-06 ENCOUNTER — Emergency Department (EMERGENCY_DEPARTMENT_HOSPITAL)
Admit: 2016-06-06 | Discharge: 2016-06-06 | Disposition: A | Discharge status: Home / Self Care | Payer: Federal, State, Local not specified - PPO | Attending: Family Medicine | Admitting: Family Medicine

## 2016-06-06 ENCOUNTER — Emergency Department (HOSPITAL_COMMUNITY)
Admission: EM | Admit: 2016-06-06 | Discharge: 2016-06-06 | Disposition: A | Discharge status: Home / Self Care | Payer: Federal, State, Local not specified - PPO | Attending: Family Medicine | Admitting: Family Medicine

## 2016-06-06 ENCOUNTER — Encounter (HOSPITAL_COMMUNITY): Payer: Self-pay | Admitting: Emergency Medicine

## 2016-06-06 VITALS — BP 110/68 | HR 74 | Temp 98.2°F | Resp 18 | Ht 60.0 in | Wt 192.0 lb

## 2016-06-06 DIAGNOSIS — I868 Varicose veins of other specified sites: Secondary | ICD-10-CM

## 2016-06-06 DIAGNOSIS — I8390 Asymptomatic varicose veins of unspecified lower extremity: Secondary | ICD-10-CM

## 2016-06-06 DIAGNOSIS — M79605 Pain in left leg: Secondary | ICD-10-CM

## 2016-06-06 DIAGNOSIS — Z5321 Procedure and treatment not carried out due to patient leaving prior to being seen by health care provider: Secondary | ICD-10-CM | POA: Insufficient documentation

## 2016-06-06 DIAGNOSIS — R202 Paresthesia of skin: Secondary | ICD-10-CM

## 2016-06-06 DIAGNOSIS — M79604 Pain in right leg: Secondary | ICD-10-CM

## 2016-06-06 DIAGNOSIS — F411 Generalized anxiety disorder: Secondary | ICD-10-CM | POA: Diagnosis not present

## 2016-06-06 LAB — POCT URINE PREGNANCY: Preg Test, Ur: NEGATIVE

## 2016-06-06 NOTE — ED Notes (Signed)
Secretary called stated patient at vascular department. Had an appointment and will not be returning to ED.

## 2016-06-06 NOTE — Patient Instructions (Addendum)
1. Recommend taking Aleve twice daily for ten days with food to help with nerve inflammation and muscle inflammation in legs. 2.  Please contact a counselor to start therapy. 3. Start exercising 30 minutes daily for anxiety management.     IF you received an x-ray today, you will receive an invoice from [facility]. Please contact [facility] at [phone] with questions or concerns regarding your invoice.   IF you received labwork today, you will receive an invoice from [employer]. Please contact [facility] at [phone] with questions or concerns regarding your invoice.   Our billing staff will not be able to assist you with questions regarding bills from these companies.  You will be contacted with the lab results as soon as they are available. The fastest way to get your results is to activate your My Chart account. Instructions are located on the last page of this paperwork. If you have not heard from [place] regarding the results in 2 weeks, please contact this office.     Low Back Sprain With Rehab A sprain is an injury in which a ligament is torn. The ligaments of the lower back are vulnerable to sprains. However, they are strong and require great force to be injured. These ligaments are important for stabilizing the spinal column. Sprains are classified into three categories. Grade 1 sprains cause pain, but the tendon is not lengthened. Grade 2 sprains include a lengthened ligament, due to the ligament being stretched or partially ruptured. With grade 2 sprains there is still function, although the function may be decreased. Grade 3 sprains involve a complete tear of the tendon or muscle, and function is usually impaired. SYMPTOMS   Severe pain in the lower back.  Sometimes, a feeling of a "pop," "snap," or tear, at the time of injury.  Tenderness and sometimes swelling at the injury site.  Uncommonly, bruising (contusion) within 48 hours  of injury.  Muscle spasms in the back. CAUSES  Low back sprains occur when a force is placed on the ligaments that is greater than they can handle. Common causes of injury include:  Performing a stressful act while off-balance.  Repetitive stressful activities that involve movement of the lower back.  Direct hit (trauma) to the lower back. RISK INCREASES WITH:  Contact sports (football, wrestling).  Collisions (major skiing accidents).  Sports that require throwing or lifting (baseball, weightlifting).  Sports involving twisting of the spine (gymnastics, diving, tennis, golf).  Poor strength and flexibility.  Inadequate protection.  Previous back injury or surgery (especially fusion). PREVENTION  Wear properly fitted and padded protective equipment.  Warm up and stretch properly before activity.  Allow for adequate recovery between workouts.  Maintain physical fitness:  Strength, flexibility, and endurance.  Cardiovascular fitness.  Maintain a healthy body weight. PROGNOSIS  If treated properly, low back sprains usually heal with non-surgical treatment. The length of time for healing depends on the severity of the injury.  RELATED COMPLICATIONS   Recurring symptoms, resulting in a chronic problem.  Chronic inflammation and pain in the low back.  Delayed healing or resolution of symptoms, especially if activity is resumed too soon.  Prolonged impairment.  Unstable or arthritic joints of the low back. TREATMENT  Treatment first involves the use of ice and medicine, to reduce pain and inflammation. The use of strengthening and stretching exercises may help reduce pain with activity. These exercises may be performed at home or with a therapist. Severe injuries may require referral to a  therapist for further evaluation and treatment, such as ultrasound. Your caregiver may advise that you wear a back brace or corset, to help reduce pain and discomfort. Often,  prolonged bed rest results in greater harm then benefit. Corticosteroid injections may be recommended. However, these should be reserved for the most serious cases. It is important to avoid using your back when lifting objects. At night, sleep on your back on a firm mattress, with a pillow placed under your knees. If non-surgical treatment is unsuccessful, surgery may be needed.  MEDICATION   If pain medicine is needed, nonsteroidal anti-inflammatory medicines (aspirin and ibuprofen), or other minor pain relievers (acetaminophen), are often advised.  Do not take pain medicine for 7 days before surgery.  Prescription pain relievers may be given, if your caregiver thinks they are needed. Use only as directed and only as much as you need.  Ointments applied to the skin may be helpful.  Corticosteroid injections may be given by your caregiver. These injections should be reserved for the most serious cases, because they may only be given a certain number of times. HEAT AND COLD  Cold treatment (icing) should be applied for 10 to 15 minutes every 2 to 3 hours for inflammation and pain, and immediately after activity that aggravates your symptoms. Use ice packs or an ice massage.  Heat treatment may be used before performing stretching and strengthening activities prescribed by your caregiver, physical therapist, or athletic trainer. Use a heat pack or a warm water soak. SEEK MEDICAL CARE IF:   Symptoms get worse or do not improve in 2 to 4 weeks, despite treatment.  You develop numbness or weakness in either leg.  You lose bowel or bladder function.  Any of the following occur after surgery: fever, increased pain, swelling, redness, drainage of fluids, or bleeding in the affected area.  New, unexplained symptoms develop. (Drugs used in treatment may produce side effects.) EXERCISES  RANGE OF MOTION (ROM) AND STRETCHING EXERCISES - Low Back Sprain Most people with lower back pain will find  that their symptoms get worse with excessive bending forward (flexion) or arching at the lower back (extension). The exercises that will help resolve your symptoms will focus on the opposite motion.  Your physician, physical therapist or athletic trainer will help you determine which exercises will be most helpful to resolve your lower back pain. Do not complete any exercises without first consulting with your caregiver. Discontinue any exercises which make your symptoms worse, until you speak to your caregiver. If you have pain, numbness or tingling which travels down into your buttocks, leg or foot, the goal of the therapy is for these symptoms to move closer to your back and eventually resolve. Sometimes, these leg symptoms will get better, but your lower back pain may worsen. This is often an indication of progress in your rehabilitation. Be very alert to any changes in your symptoms and the activities in which you participated in the 24 hours prior to the change. Sharing this information with your caregiver will allow him or her to most efficiently treat your condition. These exercises may help you when beginning to rehabilitate your injury. Your symptoms may resolve with or without further involvement from your physician, physical therapist or athletic trainer. While completing these exercises, remember:   Restoring tissue flexibility helps normal motion to return to the joints. This allows healthier, less painful movement and activity.  An effective stretch should be held for at least 30 seconds.  A stretch should  never be painful. You should only feel a gentle lengthening or release in the stretched tissue. FLEXION RANGE OF MOTION AND STRETCHING EXERCISES: STRETCH - Flexion, Single Knee to Chest   Lie on a firm bed or floor with both legs extended in front of you.  Keeping one leg in contact with the floor, bring your opposite knee to your chest. Hold your leg in place by either grabbing  behind your thigh or at your knee.  Pull until you feel a gentle stretch in your low back. Hold __________ seconds.  Slowly release your grasp and repeat the exercise with the opposite side. Repeat __________ times. Complete this exercise __________ times per day.  STRETCH - Flexion, Double Knee to Chest  Lie on a firm bed or floor with both legs extended in front of you.  Keeping one leg in contact with the floor, bring your opposite knee to your chest.  Tense your stomach muscles to support your back and then lift your other knee to your chest. Hold your legs in place by either grabbing behind your thighs or at your knees.  Pull both knees toward your chest until you feel a gentle stretch in your low back. Hold __________ seconds.  Tense your stomach muscles and slowly return one leg at a time to the floor. Repeat __________ times. Complete this exercise __________ times per day.  STRETCH - Low Trunk Rotation  Lie on a firm bed or floor. Keeping your legs in front of you, bend your knees so they are both pointed toward the ceiling and your feet are flat on the floor.  Extend your arms out to the side. This will stabilize your upper body by keeping your shoulders in contact with the floor.  Gently and slowly drop both knees together to one side until you feel a gentle stretch in your low back. Hold for __________ seconds.  Tense your stomach muscles to support your lower back as you bring your knees back to the starting position. Repeat the exercise to the other side. Repeat __________ times. Complete this exercise __________ times per day  EXTENSION RANGE OF MOTION AND FLEXIBILITY EXERCISES: STRETCH - Extension, Prone on Elbows   Lie on your stomach on the floor, a bed will be too soft. Place your palms about shoulder width apart and at the height of your head.  Place your elbows under your shoulders. If this is too painful, stack pillows under your chest.  Allow your body to  relax so that your hips drop lower and make contact more completely with the floor.  Hold this position for __________ seconds.  Slowly return to lying flat on the floor. Repeat __________ times. Complete this exercise __________ times per day.  RANGE OF MOTION - Extension, Prone Press Ups  Lie on your stomach on the floor, a bed will be too soft. Place your palms about shoulder width apart and at the height of your head.  Keeping your back as relaxed as possible, slowly straighten your elbows while keeping your hips on the floor. You may adjust the placement of your hands to maximize your comfort. As you gain motion, your hands will come more underneath your shoulders.  Hold this position __________ seconds.  Slowly return to lying flat on the floor. Repeat __________ times. Complete this exercise __________ times per day.  RANGE OF MOTION- Quadruped, Neutral Spine   Assume a hands and knees position on a firm surface. Keep your hands under your shoulders and your  knees under your hips. You may place padding under your knees for comfort.  Drop your head and point your tailbone toward the ground below you. This will round out your lower back like an angry cat. Hold this position for __________ seconds.  Slowly lift your head and release your tail bone so that your back sags into a large arch, like an old horse.  Hold this position for __________ seconds.  Repeat this until you feel limber in your low back.  Now, find your "sweet spot." This will be the most comfortable position somewhere between the two previous positions. This is your neutral spine. Once you have found this position, tense your stomach muscles to support your low back.  Hold this position for __________ seconds. Repeat __________ times. Complete this exercise __________ times per day.  STRENGTHENING EXERCISES - Low Back Sprain These exercises may help you when beginning to rehabilitate your injury. These exercises  should be done near your "sweet spot." This is the neutral, low-back arch, somewhere between fully rounded and fully arched, that is your least painful position. When performed in this safe range of motion, these exercises can be used for people who have either a flexion or extension based injury. These exercises may resolve your symptoms with or without further involvement from your physician, physical therapist or athletic trainer. While completing these exercises, remember:   Muscles can gain both the endurance and the strength needed for everyday activities through controlled exercises.  Complete these exercises as instructed by your physician, physical therapist or athletic trainer. Increase the resistance and repetitions only as guided.  You may experience muscle soreness or fatigue, but the pain or discomfort you are trying to eliminate should never worsen during these exercises. If this pain does worsen, stop and make certain you are following the directions exactly. If the pain is still present after adjustments, discontinue the exercise until you can discuss the trouble with your caregiver. STRENGTHENING - Deep Abdominals, Pelvic Tilt   Lie on a firm bed or floor. Keeping your legs in front of you, bend your knees so they are both pointed toward the ceiling and your feet are flat on the floor.  Tense your lower abdominal muscles to press your low back into the floor. This motion will rotate your pelvis so that your tail bone is scooping upwards rather than pointing at your feet or into the floor. With a gentle tension and even breathing, hold this position for __________ seconds. Repeat __________ times. Complete this exercise __________ times per day.  STRENGTHENING - Abdominals, Crunches   Lie on a firm bed or floor. Keeping your legs in front of you, bend your knees so they are both pointed toward the ceiling and your feet are flat on the floor. Cross your arms over your  chest.  Slightly tip your chin down without bending your neck.  Tense your abdominals and slowly lift your trunk high enough to just clear your shoulder blades. Lifting higher can put excessive stress on the lower back and does not further strengthen your abdominal muscles.  Control your return to the starting position. Repeat __________ times. Complete this exercise __________ times per day.  STRENGTHENING - Quadruped, Opposite UE/LE Lift   Assume a hands and knees position on a firm surface. Keep your hands under your shoulders and your knees under your hips. You may place padding under your knees for comfort.  Find your neutral spine and gently tense your abdominal muscles so that you  can maintain this position. Your shoulders and hips should form a rectangle that is parallel with the floor and is not twisted.  Keeping your trunk steady, lift your right hand no higher than your shoulder and then your left leg no higher than your hip. Make sure you are not holding your breath. Hold this position for __________ seconds.  Continuing to keep your abdominal muscles tense and your back steady, slowly return to your starting position. Repeat with the opposite arm and leg. Repeat __________ times. Complete this exercise __________ times per day.  STRENGTHENING - Abdominals and Quadriceps, Straight Leg Raise   Lie on a firm bed or floor with both legs extended in front of you.  Keeping one leg in contact with the floor, bend the other knee so that your foot can rest flat on the floor.  Find your neutral spine, and tense your abdominal muscles to maintain your spinal position throughout the exercise.  Slowly lift your straight leg off the floor about 6 inches for a count of 15, making sure to not hold your breath.  Still keeping your neutral spine, slowly lower your leg all the way to the floor. Repeat this exercise with each leg __________ times. Complete this exercise __________ times per  day. POSTURE AND BODY MECHANICS CONSIDERATIONS - Low Back Sprain Keeping correct posture when sitting, standing or completing your activities will reduce the stress put on different body tissues, allowing injured tissues a chance to heal and limiting painful experiences. The following are general guidelines for improved posture. Your physician or physical therapist will provide you with any instructions specific to your needs. While reading these guidelines, remember:  The exercises prescribed by your provider will help you have the flexibility and strength to maintain correct postures.  The correct posture provides the best environment for your joints to work. All of your joints have less wear and tear when properly supported by a spine with good posture. This means you will experience a healthier, less painful body.  Correct posture must be practiced with all of your activities, especially prolonged sitting and standing. Correct posture is as important when doing repetitive low-stress activities (typing) as it is when doing a single heavy-load activity (lifting). RESTING POSITIONS Consider which positions are most painful for you when choosing a resting position. If you have pain with flexion-based activities (sitting, bending, stooping, squatting), choose a position that allows you to rest in a less flexed posture. You would want to avoid curling into a fetal position on your side. If your pain worsens with extension-based activities (prolonged standing, working overhead), avoid resting in an extended position such as sleeping on your stomach. Most people will find more comfort when they rest with their spine in a more neutral position, neither too rounded nor too arched. Lying on a non-sagging bed on your side with a pillow between your knees, or on your back with a pillow under your knees will often provide some relief. Keep in mind, being in any one position for a prolonged period of time, no matter  how correct your posture, can still lead to stiffness. PROPER SITTING POSTURE In order to minimize stress and discomfort on your spine, you must sit with correct posture. Sitting with good posture should be effortless for a healthy body. Returning to good posture is a gradual process. Many people can work toward this most comfortably by using various supports until they have the flexibility and strength to maintain this posture on their own. When  sitting with proper posture, your ears will fall over your shoulders and your shoulders will fall over your hips. You should use the back of the chair to support your upper back. Your lower back will be in a neutral position, just slightly arched. You may place a small pillow or folded towel at the base of your lower back for  support.  When working at a desk, create an environment that supports good, upright posture. Without extra support, muscles tire, which leads to excessive strain on joints and other tissues. Keep these recommendations in mind: CHAIR:  A chair should be able to slide under your desk when your back makes contact with the back of the chair. This allows you to work closely.  The chair's height should allow your eyes to be level with the upper part of your monitor and your hands to be slightly lower than your elbows. BODY POSITION  Your feet should make contact with the floor. If this is not possible, use a foot rest.  Keep your ears over your shoulders. This will reduce stress on your neck and low back. INCORRECT SITTING POSTURES  If you are feeling tired and unable to assume a healthy sitting posture, do not slouch or slump. This puts excessive strain on your back tissues, causing more damage and pain. Healthier options include:  Using more support, like a lumbar pillow.  Switching tasks to something that requires you to be upright or walking.  Talking a brief walk.  Lying down to rest in a neutral-spine position. PROLONGED  STANDING WHILE SLIGHTLY LEANING FORWARD  When completing a task that requires you to lean forward while standing in one place for a long time, place either foot up on a stationary 2-4 inch high object to help maintain the best posture. When both feet are on the ground, the lower back tends to lose its slight inward curve. If this curve flattens (or becomes too large), then the back and your other joints will experience too much stress, tire more quickly, and can cause pain. CORRECT STANDING POSTURES Proper standing posture should be assumed with all daily activities, even if they only take a few moments, like when brushing your teeth. As in sitting, your ears should fall over your shoulders and your shoulders should fall over your hips. You should keep a slight tension in your abdominal muscles to brace your spine. Your tailbone should point down to the ground, not behind your body, resulting in an over-extended swayback posture.  INCORRECT STANDING POSTURES  Common incorrect standing postures include a forward head, locked knees and/or an excessive swayback. WALKING Walk with an upright posture. Your ears, shoulders and hips should all line-up. PROLONGED ACTIVITY IN A FLEXED POSITION When completing a task that requires you to bend forward at your waist or lean over a low surface, try to find a way to stabilize 3 out of 4 of your limbs. You can place a hand or elbow on your thigh or rest a knee on the surface you are reaching across. This will provide you more stability, so that your muscles do not tire as quickly. By keeping your knees relaxed, or slightly bent, you will also reduce stress across your lower back. CORRECT LIFTING TECHNIQUES DO :  Assume a wide stance. This will provide you more stability and the opportunity to get as close as possible to the object which you are lifting.  Tense your abdominals to brace your spine. Bend at the knees and hips. Keeping  your back locked in a  neutral-spine position, lift using your leg muscles. Lift with your legs, keeping your back straight.  Test the weight of unknown objects before attempting to lift them.  Try to keep your elbows locked down at your sides in order get the best strength from your shoulders when carrying an object.  Always ask for help when lifting heavy or awkward objects. INCORRECT LIFTING TECHNIQUES DO NOT:   Lock your knees when lifting, even if it is a small object.  Bend and twist. Pivot at your feet or move your feet when needing to change directions.  Assume that you can safely pick up even a paperclip without proper posture.   This information is not intended to replace advice given to you by your health care provider. Make sure you discuss any questions you have with your health care provider.   Document Released: 09/23/2005 Document Revised: 10/14/2014 Document Reviewed: 01/05/2009 Elsevier Interactive Patient Education [employer].

## 2016-06-06 NOTE — ED Triage Notes (Signed)
Pt states she c/o "pulsing sensation and tingling" in bilateral legs, states is mainly her L leg and also sometimes her R leg. Pt concerned for blood clot. Pt states she had an episode where she felt like her heart was racing, pt states she felt anxious about it being a blood clot. Pt denies any CP or SOB, pt HR is 90 in triage.

## 2016-06-06 NOTE — Progress Notes (Signed)
Subjective:    Patient ID: Sherri Buckley, female    DOB: May 25, 1976, 40 y.o.   MRN: [id number]  By signing my name below, I, Judithe Modest, attest that this documentation has been prepared under the direction and in the presence of Sonia Baller, MD. Electronically Signed: Judithe Modest, ER Scribe. 06/06/2016. 9:46 AM.  06/06/2016  Leg Pain (RIGHT TINGLING)  HPI HPI Comments: Sherri Buckley is a 40 y.o. female who presents to [facility] complaining of a tingling and pulsating sensation in her lower left calf and upper right thigh. The upper thigh discomfort has been constant for the last month. The lower left calf discomfort has been intermittent for one week. She denies associated back pain, weakness in extremities, or saddle anesthesia. She denies recent long distance travel, change in medications, or change in birth control. She has no medical or family hx of blood clots. 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 Her BM and urination have been normal. She had a repeat MRI brain one month ago due to repeat paresthesias; s/p neurology consultation.  She reported to the ER twice for these sx, and spoke to her neurologist and both her neurologist and the ER doctor told her they thought her sx were related to anxiety. She drinks 3-4 beers 2-3 times per week. She is currently on her period. She has no family hx of anxiety.   She does not feel that she is suffering with anxiety; she denies any stressors.  She does not want to start medication for anxiety; she prefers to undergo counseling.  She is not exercising currently due to R ankle osteoarthritis; s/p ortho evaluation recently. Pt has also undergone urology consultation recently for urinary frequency; two visits at [facility] in 02/2016 for urinary frequency.  No family history of DVT or pulmonary embolism.  Does have known varicose veins in B legs. No recent travel, immobilization, or surgery.   Review of Systems    Constitutional: Negative for chills, diaphoresis, fatigue and fever.  Eyes: Negative for visual disturbance.  Respiratory: Negative for cough and shortness of breath.   Cardiovascular: Negative for chest pain, palpitations and leg swelling.  Gastrointestinal: Negative for abdominal pain, constipation, diarrhea, nausea and vomiting.  Endocrine: Negative for cold intolerance, heat intolerance, polydipsia, polyphagia and polyuria.  Musculoskeletal: Positive for myalgias. Negative for back pain and gait problem.  Neurological: Negative for dizziness, tremors, seizures, syncope, facial asymmetry, speech difficulty, weakness, light-headedness, numbness and headaches.       Tingling and pulsating in left lower leg and right upper thigh.  Psychiatric/Behavioral: Negative for dysphoric mood, self-injury, sleep disturbance and suicidal ideas. The patient is not nervous/anxious.     Past Medical History:  Diagnosis Date  . Allergy   . Asthma   . Blood type, Rh negative   . Chronic bronchitis ([place])    "get it q yr"  . Depression 10/07/1998  . [id number])    "monthly" (12/21/2014)  . Osteoarthritis of ankle, right   . Seasonal allergies   . Substance abuse    Past Surgical History:  Procedure Laterality Date  . BREAST BIOPSY Right ~ 1993   "took out some benign tissue"   No Known Allergies Current Outpatient Prescriptions  Medication Sig Dispense Refill  . albuterol (PROVENTIL HFA;VENTOLIN HFA) 108 (90 Base) MCG/ACT inhaler Inhale 2 puffs into the lungs every 6 (six) hours as needed for wheezing or shortness of breath.    . beclomethasone (  QVAR) 40 MCG/ACT inhaler Inhale 2 puffs into the lungs 2 (two) times daily. 1 Inhaler 12  . ibuprofen (ADVIL,MOTRIN) 200 MG tablet Take 200 mg by mouth daily as needed for headache.    . Multiple Vitamin (MULTIVITAMIN WITH MINERALS) TABS Take 1 tablet by mouth daily.    Marland Kitchen ALPRAZolam (XANAX) 0.25 MG tablet Take 0.25 mg by mouth 2 (two) times daily as  needed for anxiety.     Current Facility-Administered Medications  Medication Dose Route Frequency Provider Last Rate Last Dose  . albuterol (PROVENTIL) (2.5 MG/3ML) 0.083% nebulizer solution 2.5 mg  2.5 mg Nebulization Once Wardell Honour, MD       Social History   Social History  . Marital status: Single    Spouse name: N/A  . Number of children: N/A  . Years of education: N/A   Occupational History  .  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 Smoking status: Current Some Day Smoker  . Smokeless tobacco: Never Used     Comment: blacky milds  . Alcohol use 0.0 oz/week     Comment: 12/21/2014 "quit drinking in 2013"  . Drug use:     Types: Marijuana     Comment: "quit smoking marijuana in 2013"  . Sexual activity: No   Other Topics Concern  . Not on file   Social History Narrative   Marital status: single; not dating      Children:  None      Lives: alone in house.      Employment: Actor x 11 years; clerk processing; happy; 3rd shift;       Tobacco:  None      Alcohol: rare alcohol; previous alcoholism; stopped drinking heavily 2014; previous counseling; went through EAP with work.      Drugs:  None; marijuana; rare use now.      Exercise:  None      Seatbelt:  100%      Guns:  None      Sexual activity:  Total partners = 25; history of crabs; dates males only.  Last STD screening 4 years ago.     Family History  Problem Relation Age of Onset  . Heart disease Maternal Grandmother     9 bypass surgery  . Cancer Maternal Grandmother     mouth  . Sickle cell trait Father   . Seizures Father   . Alcohol abuse Father   . Thyroid disease Mother     as a child  . Hypertension Mother   . Anemia Mother   . Migraines Mother   . Hernia Mother   . Diabetes Mother   . Hypertension Paternal Grandmother   . Diabetes Paternal Grandmother   . Arthritis Maternal Aunt        Objective:    BP 110/68 (BP Location: Right Arm,  Patient Position: Sitting, Cuff Size: Large)   Pulse 74   Temp 98.2 F (36.8 C) (Oral)   Resp 18   Ht 5' (1.524 m)   Wt 192 lb (87.1 kg)   LMP 06/01/2016   SpO2 98%   BMI 37.50 kg/m  Physical Exam  Constitutional: She is oriented to person, place, and time. She appears well-developed and well-nourished. No distress.  HENT:  Head: Normocephalic and atraumatic.  Eyes: Conjunctivae are normal. Pupils are equal, round, and reactive to light.  Neck: Normal range of motion. Neck supple.  Cardiovascular: Normal rate,  regular rhythm and normal heart sounds.  Exam reveals no gallop and no friction rub.   No murmur heard. Pulmonary/Chest: Effort normal and breath sounds normal. No respiratory distress. She has no wheezes. She has no rales.  Musculoskeletal: Normal range of motion.       Right knee: Normal. She exhibits normal range of motion and no swelling.       Left knee: Normal. She exhibits normal range of motion and no swelling.       Lumbar back: Normal. She exhibits normal range of motion, no tenderness, no bony tenderness, no swelling, no edema, no pain and no spasm.       Right upper leg: Normal. She exhibits no tenderness, no bony tenderness and no swelling.       Left upper leg: Normal. She exhibits no tenderness, no bony tenderness and no swelling.       Right lower leg: Normal. She exhibits no tenderness and no bony tenderness.       Left lower leg: Normal. She exhibits no tenderness and no bony tenderness.  +Superficial vascular prominence in the right superficial thigh. Hommen's sign negative on right and left. No calf swelling on left or right. Non tender in left lower extremity or R lower extremity. Full ROM in lumbar spine without limitation or pain. Straight leg raise negative bilaterally.  Neurological: She is alert and oriented to person, place, and time. Coordination normal.  Skin: Skin is warm and dry. She is not diaphoretic.  Psychiatric: She has a normal mood and  affect. Her behavior is normal.  Nursing note and vitals reviewed.  Results for orders placed or performed in visit on 06/06/16  POCT urine pregnancy  Result Value Ref Range   Preg Test, Ur Negative Negative   Depression screen [facility] 2/9 03/19/2016 02/26/2016 02/21/2016 01/19/2016 12/19/2015  Decreased Interest 0 0 0 0 0  Down, Depressed, Hopeless 0 0 0 0 0  PHQ - 2 Score 0 0 0 0 0   GAD 7 : Generalized Anxiety Score 06/06/2016  Nervous, Anxious, on Edge 1  Control/stop worrying 1  Worry too much - different things 0  Trouble relaxing 0  Restless 0  Easily annoyed or irritable 0  Afraid - awful might happen 0  Total GAD 7 Score 2  Anxiety Difficulty Somewhat difficult    Dg Lumbar Spine Complete  Result Date: 06/06/2016 CLINICAL DATA:  Tingling and pulsing sensation in LEFT lower calf intermittently for 1 week, RIGHT upper thigh discomfort constant for past month, paresthesias LEFT leg, smoker EXAM: LUMBAR SPINE - COMPLETE 4+ VIEW COMPARISON:  None FINDINGS: Five non-rib-bearing lumbar vertebra. Osseous mineralization grossly normal for technique. Vertebral body disc space heights maintained. No acute fracture, subluxation or bone destruction. No spondylolysis. SI joints symmetric. IMPRESSION: No acute lumbar spine abnormalities. Electronically Signed   By: Lavonia Dana M.D.   On: 06/06/2016 11:05      Assessment & Plan:   1. Varicose veins   2. Leg pain, bilateral   3. Paresthesias   4. Anxiety state    -New pulsating sensation in B legs with localized paresthesias along L medial calf region; no associated swelling; known varicose veins in B legs; pt very concerned about DVT; refer for lower extremity dopplers with varicose veins and obesity.  If dopplers negative, treat vague symptoms with Aleve bid for ten days and lower back stretches for any radicular process. S/p lumbar spine films in office. -s/p neurology consultation for vague transient paresthesias.  -  pt declined medication  for anxiety; pt prefers therapy; highly recommend establishing with therapist locally; also recommend daily exercise for stress and anxiety management.  No anxiety disorder present at present yet patient does admit to excess worry and anxiety about physical symptoms and overall health/well-being.   Orders Placed This Encounter  Procedures  . DG Lumbar Spine Complete    Standing Status:   Future    Number of Occurrences:   1    Standing Expiration Date:   06/06/2017    Order Specific Question:   Reason for Exam (SYMPTOM  OR DIAGNOSIS REQUIRED)    Answer:   paresthesias L leg    Order Specific Question:   Is the patient pregnant?    Answer:   No    Order Specific Question:   Preferred imaging location?    Answer:   External  . POCT urine pregnancy   No orders of the defined types were placed in this encounter.   No Follow-up on file.   I personally performed the services described in this documentation, which was scribed in my presence. The recorded information has been reviewed and considered.  Tatem Fesler Elayne Guerin, M.D. [facility] [address] [place], [place]  [address] [phone] phone [phone] fax

## 2016-06-06 NOTE — Progress Notes (Addendum)
*  PRELIMINARY RESULTS* Vascular Ultrasound Lower extremity venous duplex has been completed.  Preliminary findings: No evidence of DVT or baker's cyst.   Called results to Dr. Tamala Julian. Expressed patient's concern about having heart palpitations. Per Dr. Tamala Julian, patient can either come back to Urgent care or go to ED if she feels it is urgent.   Landry Mellow, RDMS, RVT  06/06/2016, 2:26 PM

## 2016-06-07 ENCOUNTER — Ambulatory Visit (HOSPITAL_COMMUNITY): Payer: Federal, State, Local not specified - PPO

## 2016-06-07 ENCOUNTER — Ambulatory Visit: Payer: Federal, State, Local not specified - PPO

## 2016-06-07 ENCOUNTER — Emergency Department (HOSPITAL_COMMUNITY)
Admission: EM | Admit: 2016-06-07 | Discharge: 2016-06-07 | Disposition: A | Discharge status: Home / Self Care | Payer: Federal, State, Local not specified - PPO | Attending: Emergency Medicine | Admitting: Emergency Medicine

## 2016-06-07 ENCOUNTER — Encounter (HOSPITAL_COMMUNITY): Payer: Self-pay | Admitting: Emergency Medicine

## 2016-06-07 DIAGNOSIS — F17200 Nicotine dependence, unspecified, uncomplicated: Secondary | ICD-10-CM | POA: Diagnosis not present

## 2016-06-07 DIAGNOSIS — R002 Palpitations: Secondary | ICD-10-CM | POA: Diagnosis not present

## 2016-06-07 DIAGNOSIS — M7989 Other specified soft tissue disorders: Secondary | ICD-10-CM | POA: Insufficient documentation

## 2016-06-07 DIAGNOSIS — J45909 Unspecified asthma, uncomplicated: Secondary | ICD-10-CM | POA: Insufficient documentation

## 2016-06-07 DIAGNOSIS — Z79899 Other long term (current) drug therapy: Secondary | ICD-10-CM | POA: Diagnosis not present

## 2016-06-07 LAB — CBC WITH DIFFERENTIAL/PLATELET
Basophils Absolute: 0 10*3/uL (ref 0.0–0.1)
Basophils Relative: 0 %
EOS ABS: 0.5 10*3/uL (ref 0.0–0.7)
EOS PCT: 5 %
HCT: 34.8 % — ABNORMAL LOW (ref 36.0–46.0)
HEMOGLOBIN: 12.1 g/dL (ref 12.0–15.0)
LYMPHS ABS: 2.9 10*3/uL (ref 0.7–4.0)
LYMPHS PCT: 32 %
MCH: 26.5 pg (ref 26.0–34.0)
MCHC: 34.8 g/dL (ref 30.0–36.0)
MCV: 76.3 fL — AB (ref 78.0–100.0)
MONOS PCT: 8 %
Monocytes Absolute: 0.8 10*3/uL (ref 0.1–1.0)
NEUTROS PCT: 55 %
Neutro Abs: 5.1 10*3/uL (ref 1.7–7.7)
Platelets: 365 10*3/uL (ref 150–400)
RBC: 4.56 MIL/uL (ref 3.87–5.11)
RDW: 14.1 % (ref 11.5–15.5)
WBC: 9.3 10*3/uL (ref 4.0–10.5)

## 2016-06-07 LAB — BASIC METABOLIC PANEL
Anion gap: 10 (ref 5–15)
BUN: 15 mg/dL (ref 6–20)
CHLORIDE: 104 mmol/L (ref 101–111)
CO2: 24 mmol/L (ref 22–32)
CREATININE: 0.76 mg/dL (ref 0.44–1.00)
Calcium: 9.6 mg/dL (ref 8.9–10.3)
GFR calc Af Amer: >60 mL/min (ref >60)
GFR calc non Af Amer: >60 mL/min (ref >60)
GLUCOSE: 86 mg/dL (ref 65–99)
POTASSIUM: 3.4 mmol/L — AB (ref 3.5–5.1)
SODIUM: 138 mmol/L (ref 135–145)

## 2016-06-07 NOTE — ED Provider Notes (Signed)
[facility] DEPT Provider Note   CSN: [id number] Arrival date & time: 06/07/16  0203     History   Chief Complaint Chief Complaint  Patient presents with  . Palpitations    HPI Sherri Buckley is a 40 y.o. female.  HPI   Patient has a PMH of allergy, asthma, depression, headache and allergies. Also PMH of substance abuse. She has been seeing her PCP, a neurologist for a few years and they have done a huge gamet of tests but do not know what's wrong and therefore says they are concerned its psych related and are sending her to see a therapist. She was seen yesterday for this problem and LWBS, after obtaining a lower extremity dopplers. She saw the results and said that they were negative.  She comes in today for swelling to her left wrist, twitching to her right leg, and palpitations over the past few days.     Past Medical History:  Diagnosis Date  . Allergy   . Asthma   . Blood type, Rh negative   . Chronic bronchitis ([place])    "get it q yr"  . Depression 10/07/1998  . [id number])    "monthly" (12/21/2014)  . Osteoarthritis of ankle, right   . Seasonal allergies   . Substance abuse     Patient Active Problem List   Diagnosis Date Noted  . Asthma 02/21/2016  . BMI 36.0-36.9,adult 02/21/2016  . Palpitation 12/22/2014  . Elevated troponin 12/21/2014  . Allergic rhinitis 03/06/2014  . Nipple discharge 06/21/2013  . Hypovitaminosis D 02/09/2013  . Alcohol abuse 02/09/2013    Past Surgical History:  Procedure Laterality Date  . BREAST BIOPSY Right ~ 1993   "took out some benign tissue"    OB History    Gravida Para Term Preterm AB Living   9 0     8 0   SAB TAB Ectopic Multiple Live Births   1               Home Medications    Prior to Admission medications   Medication Sig Start Date End Date Taking? Authorizing Provider  albuterol (PROVENTIL HFA;VENTOLIN HFA) 108 (90 Base) MCG/ACT inhaler Inhale 2 puffs into the lungs every 6 (six) hours as  needed for wheezing or shortness of breath.   Yes Historical Provider, MD  beclomethasone (QVAR) 40 MCG/ACT inhaler Inhale 2 puffs into the lungs 2 (two) times daily. Patient taking differently: Inhale 2 puffs into the lungs 2 (two) times daily as needed (shortness of breath).  12/19/15  Yes Wardell Honour, MD  ibuprofen (ADVIL,MOTRIN) 200 MG tablet Take 200 mg by mouth daily as needed for headache.   Yes Historical Provider, MD  Multiple Vitamin (MULTIVITAMIN WITH MINERALS) TABS Take 1 tablet by mouth daily.   Yes Historical Provider, MD    Family History Family History  Problem Relation Age of Onset  . Heart disease Maternal Grandmother     9 bypass surgery  . Cancer Maternal Grandmother     mouth  . Sickle cell trait Father   . Seizures Father   . Alcohol abuse Father   . Thyroid disease Mother     as a child  . Hypertension Mother   . Anemia Mother   . Migraines Mother   . Hernia Mother   . Diabetes Mother   . Hypertension Paternal Grandmother   . Diabetes Paternal Grandmother   . Arthritis Maternal Aunt     Social History Social History  Substance Use Topics  . Smoking status: Current Some Day Smoker  . Smokeless tobacco: Never Used     Comment: blacky milds  . Alcohol use 0.0 oz/week     Comment: 12/21/2014 "quit drinking in 2013"     Allergies   Review of patient's allergies indicates no known allergies.   Review of Systems Review of Systems  ROS: No TIA's or unusual headaches, no dysphagia.  No prolonged cough. No dyspnea or chest pain on exertion.  No abdominal pain, change in bowel habits, black or bloody stools.  No urinary tract symptoms.  No new or unusual musculoskeletal symptoms.  Normal menses, no abnormal vaginal bleeding, discharge or unexpected pelvic pain. No new breast lumps, breast pain or nipple discharge.   Physical Exam Updated Vital Signs BP 108/60 (BP Location: Right Arm)   Pulse 94   Temp 98.3 F (36.8 C) (Oral)   Resp 23   Ht 5'  (1.524 m)   Wt 86.2 kg   LMP 06/01/2016   SpO2 98%   BMI 37.13 kg/m   Physical Exam  Constitutional: She appears well-developed and well-nourished.  HENT:  Head: Normocephalic and atraumatic.  Eyes: Conjunctivae are normal. Pupils are equal, round, and reactive to light.  Neck: Trachea normal, normal range of motion and full passive range of motion without pain. Neck supple.  Cardiovascular: Normal rate, regular rhythm and normal pulses.   Pulmonary/Chest: Effort normal and breath sounds normal. Chest wall is not dull to percussion. She exhibits no tenderness, no crepitus, no edema, no deformity and no retraction.  Abdominal: Soft. Normal appearance and bowel sounds are normal.  Musculoskeletal: Normal range of motion.  Neurological: She is alert. She has normal strength.  Skin: Skin is warm, dry and intact.  Psychiatric: She has a normal mood and affect. Her speech is normal and behavior is normal. Judgment and thought content normal. Cognition and memory are normal.  Nursing note and vitals reviewed.    ED Treatments / Results  Labs (all labs ordered are listed, but only abnormal results are displayed) Labs Reviewed  CBC WITH DIFFERENTIAL/PLATELET - Abnormal; Notable for the following:       Result Value   HCT 34.8 (*)    MCV 76.3 (*)    All other components within normal limits  BASIC METABOLIC PANEL - Abnormal; Notable for the following:    Potassium 3.4 (*)    All other components within normal limits    EKG  EKG Interpretation  Date/Time:  Friday June 07 2016 02:09:05 EDT Ventricular Rate:  99 PR Interval:  154 QRS Duration: 70 QT Interval:  338 QTC Calculation: 433 R Axis:   53 Text Interpretation:  Normal sinus rhythm Confirmed by [facility]  MD, APRIL ([id number]) on 06/07/2016 2:13:54 AM       Radiology Dg Lumbar Spine Complete  Result Date: 06/06/2016 CLINICAL DATA:  Tingling and pulsing sensation in LEFT lower calf intermittently for 1 week, RIGHT  upper thigh discomfort constant for past month, paresthesias LEFT leg, smoker EXAM: LUMBAR SPINE - COMPLETE 4+ VIEW COMPARISON:  None FINDINGS: Five non-rib-bearing lumbar vertebra. Osseous mineralization grossly normal for technique. Vertebral body disc space heights maintained. No acute fracture, subluxation or bone destruction. No spondylolysis. SI joints symmetric. IMPRESSION: No acute lumbar spine abnormalities. Electronically Signed   By: Lavonia Dana M.D.   On: 06/06/2016 11:05    Procedures Procedures (including critical care time)  Medications Ordered in ED Medications - No data to display  Initial Impression / Assessment and Plan / ED Course  I have reviewed the triage vital signs and the nursing notes.  Pertinent labs & imaging results that were available during my care of the patient were reviewed by me and considered in my medical decision making (see chart for details).  Clinical Course    Normal laboratory workup in the ED. Negative Dopplers. Pt does seem to have a large component of anxiety and concern about multiple vague symptoms. Same as her PCP and the neurologist I do not know the etiologies of these symptoms. I recommend she call her PCP to arrange a follow-up appointment so that they can further discuss her symptoms.  I discussed results, diagnoses and plan with Sherri Buckley. They voice there understanding and questions were answered. We discussed follow-up recommendations and return precautions.   Final Clinical Impressions(s) / ED Diagnoses   Final diagnoses:  Palpitations  Swelling of extremity    New Prescriptions New Prescriptions   No medications on file     Earney Navy 06/07/16 0503    April Palumbo, MD 06/07/16 [phone]

## 2016-06-07 NOTE — ED Notes (Signed)
Pt presents for "heart racing" that is intermittent; pt was seen yesterday for same but left after having a doppler study of lower extremity; pt states she felt better when she left the hospital yesterday and tried to sleep but could not d/t palpitations returning; pt denies CP, SOB; pt states she now notices swelling to the left upper extremity;

## 2016-06-07 NOTE — ED Triage Notes (Signed)
Pt. reports intermittent palpitations onset last night, denies chest pain , respirations unlabored /no nausea or diaphoresis , pt. added mild left distal forearm swelling this evening .

## 2016-06-21 ENCOUNTER — Telehealth: Payer: Self-pay

## 2016-06-21 NOTE — Telephone Encounter (Signed)
Spoke with patient about lumbar spine results. Verbalized understanding.

## 2016-06-21 NOTE — Telephone Encounter (Signed)
PATIENT STATES DR. Tamala Julian ORDERED A LUMBAR X-RAY A COUPLE OF WEEKS AGO AND SHE HAS NEVER GOTTEN THE RESULTS. BEST PHONE [phone] (CELL)  [place]

## 2016-06-25 ENCOUNTER — Ambulatory Visit: Payer: Federal, State, Local not specified - PPO

## 2016-06-29 ENCOUNTER — Ambulatory Visit: Payer: Federal, State, Local not specified - PPO

## 2016-07-04 ENCOUNTER — Encounter: Payer: Self-pay | Admitting: Family Medicine

## 2016-07-04 ENCOUNTER — Ambulatory Visit (INDEPENDENT_AMBULATORY_CARE_PROVIDER_SITE_OTHER): Payer: Federal, State, Local not specified - PPO | Admitting: Family Medicine

## 2016-07-04 VITALS — BP 114/78 | HR 77 | Temp 98.1°F | Ht 60.0 in | Wt 193.0 lb

## 2016-07-04 DIAGNOSIS — R202 Paresthesia of skin: Secondary | ICD-10-CM

## 2016-07-04 DIAGNOSIS — D1722 Benign lipomatous neoplasm of skin and subcutaneous tissue of left arm: Secondary | ICD-10-CM

## 2016-07-04 DIAGNOSIS — R002 Palpitations: Secondary | ICD-10-CM

## 2016-07-04 LAB — POCT CBC
Granulocyte percent: 66.4 % (ref 37–80)
HCT, POC: 34.6 % — AB (ref 37.7–47.9)
Hemoglobin: 12.2 g/dL (ref 12.2–16.2)
Lymph, poc: 2.1 (ref 0.6–3.4)
MCH, POC: 27.6 pg (ref 27–31.2)
MCHC: 35.4 g/dL (ref 31.8–35.4)
MCV: 78 fL — AB (ref 80–97)
MID (cbc): 0.8 (ref 0–0.9)
MPV: 8.9 fL (ref 0–99.8)
POC Granulocyte: 5.8 (ref 2–6.9)
POC LYMPH PERCENT: 24.5 % (ref 10–50)
POC MID %: 9.1 % (ref 0–12)
Platelet Count, POC: 317 10*3/uL (ref 142–424)
RBC: 4.43 M/uL (ref 4.04–5.48)
RDW, POC: 14.8 %
WBC: 8.7 10*3/uL (ref 4.6–10.2)

## 2016-07-04 LAB — COMPREHENSIVE METABOLIC PANEL
ALBUMIN: 3.9 g/dL (ref 3.6–5.1)
ALT: 14 U/L (ref 6–29)
AST: 19 U/L (ref 10–30)
Alkaline Phosphatase: 44 U/L (ref 33–115)
BUN: 14 mg/dL (ref 7–25)
CALCIUM: 9.4 mg/dL (ref 8.6–10.2)
CHLORIDE: 102 mmol/L (ref 98–110)
CO2: 24 mmol/L (ref 20–31)
Creat: 0.69 mg/dL (ref 0.50–1.10)
Glucose, Bld: 88 mg/dL (ref 65–99)
POTASSIUM: 4.1 mmol/L (ref 3.5–5.3)
Sodium: 137 mmol/L (ref 135–146)
TOTAL PROTEIN: 7.1 g/dL (ref 6.1–8.1)
Total Bilirubin: 0.6 mg/dL (ref 0.2–1.2)

## 2016-07-04 LAB — VITAMIN B12: Vitamin B-12: 1037 pg/mL (ref 200–1100)

## 2016-07-04 NOTE — Progress Notes (Addendum)
By signing my name below, I, Mesha Guinyard, attest that this documentation has been prepared under the direction and in the presence of Reginia Forts, MD.  Electronically Signed: Verlee Monte, Medical Scribe. 07/04/2016. 10:47 AM.  Subjective:    Patient ID: Sherri Buckley, female    DOB: 1976-06-16, 40 y.o.   MRN: [id number]  07/04/2016  Palpitations (Heart and pulsing in left calf onset 1 month)  HPI  HPI Comments: Sherri Buckley is a 40 y.o. female who presents to the [facility] for follow-up. I last saw her 8/31 for left calf pain and she underwent lumbar spine films to rule out lumber degeneration she had a nl L-spine. Lower etremity doppler was negative. She presented with palpitations when I gave her the result of the lower extremity doppler and she went to the ED 9/1. She went back to the ED later.  Calf Pain: Pt reports intermittent pulsing and pricking sensation in her calf L>R that felt like a charlie horse. Pt feels this about 20x a day, and mentions it's been increasing in frequency since the last time she was here. Pt reports having 2 charlie horses since having her calf pain. Pt works 5 days a week with 10-12 hours shifts and it prevents her from exercising. Pt sleeps 4 hours at night, wakes up to do errands, and goes to sleep for another 3 hours before she has to go back to work. Pt sleeps 5-6 hours a night, but unless she's off she'll sleep 8 hours a night. Pt mainly sits while at work, unless she has to get up for an hour to change a machine. Pt denies back pain, leg swelling, leg weakness, fever, and exercising.  Arm: Pt reports swelling in her left forearm.  Numbness: Pt states neurology didn't find anything for her numbness she's feeling and suspected it was an underlying stress problem.  Pt reports clogged sinuses from neurology. Pt hasn't started counseling as discussed at recent visit, and hasn't called for an appt.  Palpitations: Pt reports  palpitations for a week. Pt reports having sensations that's not painful, rated 2/10, in her heart that's felt when she takes a deep breath. Pt feels "a sensation" when she takes a deep breath that's not chest tightness or palpitations. Pt is now waking up in her sleep from the palpitations. Pt had palpitations last night and she was feeling clammy but the other person beside her felt fine. Pt had a Holter monitor for 24 hours for troponin levels 2016. Pt mentions her chest discomfort in the past was in her chest, but now she feels it on her heart.  FHx: grandma had pacemaker but isn't sure if there wasn't any complications  Review of Systems  Constitutional: Positive for diaphoresis. Negative for chills, fatigue and fever.  Eyes: Negative for visual disturbance.  Respiratory: Negative for cough, chest tightness and shortness of breath.   Cardiovascular: Positive for chest pain and palpitations. Negative for leg swelling.  Gastrointestinal: Negative for abdominal pain, constipation, diarrhea, nausea and vomiting.  Endocrine: Negative for cold intolerance, heat intolerance, polydipsia, polyphagia and polyuria.  Musculoskeletal: Positive for myalgias. Negative for back pain.  Neurological: Positive for numbness. Negative for dizziness, tremors, seizures, syncope, facial asymmetry, speech difficulty, weakness, light-headedness and headaches.  Psychiatric/Behavioral: Positive for sleep disturbance. Negative for dysphoric mood. The patient is not nervous/anxious.    Past Medical History:  Diagnosis Date  . Allergy   . Asthma   . Blood type, Rh  negative   . Chronic bronchitis ([place])    "get it q yr"  . Depression 10/07/1998  . [id number])    "monthly" (12/21/2014)  . Osteoarthritis of ankle, right   . Seasonal allergies   . Substance abuse    Past Surgical History:  Procedure Laterality Date  . BREAST BIOPSY Right ~ 1993   "took out some benign tissue"   No Known Allergies Current  Outpatient Prescriptions  Medication Sig Dispense Refill  . albuterol (PROVENTIL HFA;VENTOLIN HFA) 108 (90 Base) MCG/ACT inhaler Inhale 2 puffs into the lungs every 6 (six) hours as needed for wheezing or shortness of breath.    . beclomethasone (QVAR) 40 MCG/ACT inhaler Inhale 2 puffs into the lungs 2 (two) times daily. (Patient taking differently: Inhale 2 puffs into the lungs 2 (two) times daily as needed (shortness of breath). ) 1 Inhaler 12  . ibuprofen (ADVIL,MOTRIN) 200 MG tablet Take 200 mg by mouth daily as needed for headache.    . Multiple Vitamin (MULTIVITAMIN WITH MINERALS) TABS Take 1 tablet by mouth daily.     Current Facility-Administered Medications  Medication Dose Route Frequency Provider Last Rate Last Dose  . albuterol (PROVENTIL) (2.5 MG/3ML) 0.083% nebulizer solution 2.5 mg  2.5 mg Nebulization Once Wardell Honour, MD       Social History   Social History  . Marital status: Single    Spouse name: N/A  . Number of children: N/A  . Years of education: N/A   Occupational History  .  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 Smoking status: Current Some Day Smoker  . Smokeless tobacco: Never Used     Comment: blacky milds  . Alcohol use 0.0 oz/week     Comment: 12/21/2014 "quit drinking in 2013"  . Drug use:     Types: Marijuana     Comment: "quit smoking marijuana in 2013"  . Sexual activity: No   Other Topics Concern  . Not on file   Social History Narrative   Marital status: single; not dating      Children:  None      Lives: alone in house.      Employment: Actor x 11 years; clerk processing; happy; 3rd shift.       Tobacco:  Cigars once or twice per week.      Alcohol: drinks beer 5 per weekl; previous alcoholism; stopped drinking heavily 2014; previous counseling; went through EAP with work.      Drugs:  None; h/o marijuana; rare use now.      Exercise:  None      Seatbelt:  100%      Guns:  None       Sexual activity:  Total partners = 25; history of crabs; dates males only.  Last STD screening 4 years ago.     Family History  Problem Relation Age of Onset  . Heart disease Maternal Grandmother     9 bypass surgery  . Cancer Maternal Grandmother     mouth  . Sickle cell trait Father   . Seizures Father   . Alcohol abuse Father   . Thyroid disease Mother     as a child  . Hypertension Mother   . Anemia Mother   . Migraines Mother   . Hernia Mother   . Diabetes Mother   . Hypertension Paternal Grandmother   . Diabetes Paternal Grandmother   . Arthritis  Maternal Aunt    Objective:    BP 114/78 (BP Location: Right Arm, Patient Position: Sitting, Cuff Size: Large)   Pulse 77   Temp 98.1 F (36.7 C) (Oral)   Ht 5' (1.524 m)   Wt 193 lb (87.5 kg)   LMP 05/25/2016   SpO2 98%   BMI 37.69 kg/m  Physical Exam  Constitutional: She is oriented to person, place, and time. She appears well-developed and well-nourished. No distress.  HENT:  Head: Normocephalic and atraumatic.  Right Ear: External ear normal.  Left Ear: External ear normal.  Nose: Nose normal.  Mouth/Throat: Oropharynx is clear and moist.  Eyes: Conjunctivae and EOM are normal. Pupils are equal, round, and reactive to light.  Neck: Normal range of motion. Neck supple. Carotid bruit is not present. No thyromegaly present.  Cardiovascular: Normal rate, regular rhythm, normal heart sounds and intact distal pulses.  Exam reveals no gallop and no friction rub.   No murmur heard. Pulmonary/Chest: Effort normal and breath sounds normal. She has no wheezes. She has no rales.  Abdominal: Soft. Bowel sounds are normal. She exhibits no distension and no mass. There is no tenderness. There is no rebound and no guarding.  Musculoskeletal:       Left hip: Normal. She exhibits normal range of motion, normal strength, no tenderness and no bony tenderness.       Left knee: Normal. She exhibits normal range of motion. No tenderness  found. No medial joint line and no lateral joint line tenderness noted.       Left ankle: Normal. She exhibits normal range of motion, no swelling and no ecchymosis. No tenderness.       Lumbar back: Normal. She exhibits normal range of motion, no tenderness, no bony tenderness, no swelling, no edema, no pain and no spasm.       Left lower leg: Normal. She exhibits no tenderness, no bony tenderness, no swelling and no edema.  8 mm mobile fatty region on the left distal forearm.  Lymphadenopathy:    She has no cervical adenopathy.  Neurological: She is alert and oriented to person, place, and time. No cranial nerve deficit.  Skin: Skin is warm and dry. No rash noted. She is not diaphoretic. No erythema. No pallor.  Psychiatric: She has a normal mood and affect. Her behavior is normal.  Nursing note and vitals reviewed.    Assessment & Plan:   1. Palpitations   2. Paresthesias   3. Benign lipomatous neoplasm of skin and subcutaneous tissue of left arm (CODE)     -worsening/recurrent palpitations; refer to cardiology for reevaluation; obtain labs; pt does not feel that anxiety contributing to current symptoms; s/p previous cardiology consultation for palpitations and she is very concerned about recurrence; thus, will refer back to cardiology. -L lateral distal leg paresthesias; intermittent throughout the day; benign exam at this time.  Recommend stretching, resting, and regular exercise. Obtain labs.  Recommended trial of NSAID but patient declined.  Orders Placed This Encounter  Procedures  . Vitamin B12  . Comprehensive metabolic panel  . Ambulatory referral to Cardiology    Referral Priority:   Routine    Referral Type:   Consultation    Referral Reason:   Specialty Services Required    Requested Specialty:   Cardiology    Number of Visits Requested:   1  . POCT CBC  . EKG 12-Lead   No orders of the defined types were placed in this encounter.  Return if symptoms worsen or  fail to improve.   I personally performed the services described in this documentation, which was scribed in my presence. The recorded information has been reviewed and considered.   Elnathan Fulford Elayne Guerin, M.D. [facility] [address] [place], [place]  [phone] phone [phone] fax

## 2016-07-04 NOTE — Patient Instructions (Addendum)
Palpitations A palpitation is the feeling that your heartbeat is irregular or is faster than normal. It may feel like your heart is fluttering or skipping a beat. Palpitations are usually not a serious problem. However, in some cases, you may need further medical evaluation. CAUSES  Palpitations can be caused by:  Smoking.  Caffeine or other stimulants, such as diet pills or energy drinks.  Alcohol.  Stress and anxiety.  Strenuous physical activity.  Fatigue.  Certain medicines.  Heart disease, especially if you have a history of irregular heart rhythms (arrhythmias), such as atrial fibrillation, atrial flutter, or supraventricular tachycardia.  An improperly working pacemaker or defibrillator. DIAGNOSIS  To find the cause of your palpitations, your health care provider will take your medical history and perform a physical exam. Your health care provider may also have you take a test called an ambulatory electrocardiogram (ECG). An ECG records your heartbeat patterns over a 24-hour period. You may also have other tests, such as:  Transthoracic echocardiogram (TTE). During echocardiography, sound waves are used to evaluate how blood flows through your heart.  Transesophageal echocardiogram (TEE).  Cardiac monitoring. This allows your health care provider to monitor your heart rate and rhythm in real time.  Holter monitor. This is a portable device that records your heartbeat and can help diagnose heart arrhythmias. It allows your health care provider to track your heart activity for several days, if needed.  Stress tests by exercise or by giving medicine that makes the heart beat faster. TREATMENT  Treatment of palpitations depends on the cause of your symptoms and can vary greatly. Most cases of palpitations do not require any treatment other than time, relaxation, and monitoring your symptoms. Other causes, such as atrial fibrillation, atrial flutter, or supraventricular  tachycardia, usually require further treatment. HOME CARE INSTRUCTIONS   Avoid:  Caffeinated coffee, tea, soft drinks, diet pills, and energy drinks.  Chocolate.  Alcohol.  Stop smoking if you smoke.  Reduce your stress and anxiety. Things that can help you relax include:  A method of controlling things in your body, such as your heartbeats, with your mind (biofeedback).  Yoga.  Meditation.  Physical activity such as swimming, jogging, or walking.  Get plenty of rest and sleep. SEEK MEDICAL CARE IF:   You continue to have a fast or irregular heartbeat beyond 24 hours.  Your palpitations occur more often. SEEK IMMEDIATE MEDICAL CARE IF:  You have chest pain or shortness of breath.  You have a severe headache.  You feel dizzy or you faint. MAKE SURE YOU:  Understand these instructions.  Will watch your condition.  Will get help right away if you are not doing well or get worse.   This information is not intended to replace advice given to you by your health care provider. Make sure you discuss any questions you have with your health care provider.   Document Released: 09/20/2000 Document Revised: 09/28/2013 Document Reviewed: 11/22/2011 Elsevier Interactive Patient Education 2016 [employer].     IF you received an x-ray today, you will receive an invoice from [facility]. Please contact [facility] at [phone] with questions or concerns regarding your invoice.   IF you received labwork today, you will receive an invoice from [employer]. Please contact [facility] at [phone] with questions or concerns regarding your invoice.   Our billing staff will not be able to assist you with questions regarding bills from these companies.  You will be contacted with the lab results as  soon as they are available. The fastest way to get your results is to activate your My Chart account. Instructions are located on the last  page of this paperwork. If you have not heard from [place] regarding the results in 2 weeks, please contact this office.

## 2016-07-08 DIAGNOSIS — M19071 Primary osteoarthritis, right ankle and foot: Secondary | ICD-10-CM | POA: Diagnosis not present

## 2016-07-09 ENCOUNTER — Encounter: Payer: Self-pay | Admitting: Family Medicine

## 2016-07-09 ENCOUNTER — Ambulatory Visit (INDEPENDENT_AMBULATORY_CARE_PROVIDER_SITE_OTHER): Payer: Federal, State, Local not specified - PPO | Admitting: Family Medicine

## 2016-07-09 VITALS — BP 102/76 | HR 76 | Temp 99.0°F | Resp 16 | Ht 61.5 in | Wt 187.8 lb

## 2016-07-09 DIAGNOSIS — M62838 Other muscle spasm: Secondary | ICD-10-CM | POA: Diagnosis not present

## 2016-07-09 DIAGNOSIS — M19171 Post-traumatic osteoarthritis, right ankle and foot: Secondary | ICD-10-CM

## 2016-07-09 NOTE — Patient Instructions (Addendum)
1. Return to office for swelling in R calf, redness to area, or pain moderate (7-10/10). 2.  I would recommend wearing a supportive shoe today, stretch ankle gently.   Muscle Cramps and Spasms Muscle cramps and spasms occur when a muscle or muscles tighten and you have no control over this tightening (involuntary muscle contraction). They are a common problem and can develop in any muscle. The most common place is in the calf muscles of the leg. Both muscle cramps and muscle spasms are involuntary muscle contractions, but they also have differences:   Muscle cramps are sporadic and painful. They may last a few seconds to a quarter of an hour. Muscle cramps are often more forceful and last longer than muscle spasms.  Muscle spasms may or may not be painful. They may also last just a few seconds or much longer. CAUSES  It is uncommon for cramps or spasms to be due to a serious underlying problem. In many cases, the cause of cramps or spasms is unknown. Some common causes are:   Overexertion.   Overuse from repetitive motions (doing the same thing over and over).   Remaining in a certain position for a long period of time.   Improper preparation, form, or technique while performing a sport or activity.   Dehydration.   Injury.   Side effects of some medicines.   Abnormally low levels of the salts and ions in your blood (electrolytes), especially potassium and calcium. This could happen if you are taking water pills (diuretics) or you are pregnant.  Some underlying medical problems can make it more likely to develop cramps or spasms. These include, but are not limited to:   Diabetes.   Parkinson disease.   Hormone disorders, such as thyroid problems.   Alcohol abuse.   Diseases specific to muscles, joints, and bones.   Blood vessel disease where not enough blood is getting to the muscles.  HOME CARE INSTRUCTIONS   Stay well hydrated. Drink enough water and fluids  to keep your urine clear or pale yellow.  It may be helpful to massage, stretch, and relax the affected muscle.  For tight or tense muscles, use a warm towel, heating pad, or hot shower water directed to the affected area.  If you are sore or have pain after a cramp or spasm, applying ice to the affected area may relieve discomfort.  Put ice in a plastic bag.  Place a towel between your skin and the bag.  Leave the ice on for 15-20 minutes, 03-04 times a day.  Medicines used to treat a known cause of cramps or spasms may help reduce their frequency or severity. Only take over-the-counter or prescription medicines as directed by your caregiver. SEEK MEDICAL CARE IF:  Your cramps or spasms get more severe, more frequent, or do not improve over time.  MAKE SURE YOU:   Understand these instructions.  Will watch your condition.  Will get help right away if you are not doing well or get worse.   This information is not intended to replace advice given to you by your health care provider. Make sure you discuss any questions you have with your health care provider.   Document Released: 03/15/2002 Document Revised: 01/18/2013 Document Reviewed: 09/09/2012 Elsevier Interactive Patient Education 2016 [employer].     IF you received an x-ray today, you will receive an invoice from [facility]. Please contact [facility]iology at [phone] with questions or concerns regarding your invoice.   IF  you received labwork today, you will receive an invoice from [employer]. Please contact [facility] at [phone] with questions or concerns regarding your invoice.   Our billing staff will not be able to assist you with questions regarding bills from these companies.  You will be contacted with the lab results as soon as they are available. The fastest way to get your results is to activate your My Chart account. Instructions are located on the last page  of this paperwork. If you have not heard from [place] regarding the results in 2 weeks, please contact this office.
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
## 2016-07-10 ENCOUNTER — Ambulatory Visit: Payer: Federal, State, Local not specified - PPO

## 2016-07-16 ENCOUNTER — Other Ambulatory Visit: Payer: Self-pay

## 2016-07-16 DIAGNOSIS — N6452 Nipple discharge: Secondary | ICD-10-CM

## 2016-07-29 ENCOUNTER — Telehealth: Payer: Self-pay | Admitting: Family Medicine

## 2016-07-29 DIAGNOSIS — N6452 Nipple discharge: Secondary | ICD-10-CM | POA: Diagnosis not present

## 2016-07-29 NOTE — Telephone Encounter (Signed)
Pt is calling about her referral she is really upset please respond she said that its been 2 weeks and it usuallyu takes two days

## 2016-07-31 NOTE — Telephone Encounter (Signed)
Called pt, left VM explaining that the referral was not sent out quickly due to the OV notes not being completed. Informed pt where the referral was sent, as the notes were finished that morning.

## 2016-08-05 ENCOUNTER — Ambulatory Visit: Payer: Federal, State, Local not specified - PPO | Admitting: Neurology

## 2016-08-05 ENCOUNTER — Encounter: Payer: Self-pay | Admitting: Neurology

## 2016-08-05 ENCOUNTER — Ambulatory Visit (INDEPENDENT_AMBULATORY_CARE_PROVIDER_SITE_OTHER): Payer: Federal, State, Local not specified - PPO | Admitting: Neurology

## 2016-08-05 VITALS — BP 104/70 | HR 74 | Ht 61.5 in | Wt 192.1 lb

## 2016-08-05 DIAGNOSIS — G44219 Episodic tension-type headache, not intractable: Secondary | ICD-10-CM

## 2016-08-05 DIAGNOSIS — F419 Anxiety disorder, unspecified: Secondary | ICD-10-CM | POA: Diagnosis not present

## 2016-08-05 DIAGNOSIS — F411 Generalized anxiety disorder: Secondary | ICD-10-CM | POA: Diagnosis not present

## 2016-08-05 DIAGNOSIS — R209 Unspecified disturbances of skin sensation: Secondary | ICD-10-CM

## 2016-08-05 DIAGNOSIS — F450 Somatization disorder: Secondary | ICD-10-CM

## 2016-08-05 DIAGNOSIS — R202 Paresthesia of skin: Secondary | ICD-10-CM

## 2016-08-05 NOTE — Patient Instructions (Addendum)
1.  CTA head  2.  Recommend seeing a counselor or psychiatrist  3.  Recommend that you seek a second opinion for ongoing symptoms

## 2016-08-05 NOTE — Progress Notes (Signed)
Follow-up Visit   Date: 08/05/16    SHERVON SEGERS MRN: [id number] DOB: [birth year]   Interim History: Sherri Buckley is a 40 y.o.  right-handed African American female with asthma, seasonal allergies, and previous alcoholic dependence returning to the clinic for follow-up of generalized tingling and lightheadedness.  The patient was accompanied to the clinic by self.  History of present illness: Starting in 2014, she experienced numbness/tingling involving the entire body.  She had MRI brain which was normal and symptoms resolved without any intervention.  She was doing well until June 2016, when she started experiencing chilling and tingling sensation of the left calf and now has it over the entire face, especially left cheek.  She also feels pulsing sensation over sporadic location on her body.  Symptoms are all intermittent and does not have any identifiable triggers.    She has occasional headaches.  She mild intermittent pulsing of the right eye.  Denies vision changes, swallowing/talking difficulty, or weakness.  She is experiencing more memory and word-finding problems. She is very worried that she has multiple sclerosis.   She is stressed because she is single and tends to keep to herself.    She previously used to drink a pint of liquor during the weekdays and weekends for 15 years, which she quit in 2013.  She drinks occasionally on the weekends now, usually a bottle of wine.    UPDATE 04/29/2016: Two weeks ago, she began experiencing tingling over the face and head and muscle twitches.  She was seen in the ED on July 10th for these symptoms.  Several days later, she developed lightheadedness while grocery shopping so went to the ED again on July 17th.  She was given a prescription for anxiety, but she has not picked it up yet.  She recalls getting into a bad argument with her nephew's mother when symptoms started two weeks ago.  She does not have headaches or weakness.   If she is busy, she does not notice it as much.    UPDATE 08/05/2016:  She returns because of more neurological symptoms including:  chilling sensation of the body, pain in the eye, pulsing sensation over the left scalp, whereas previously it was only tingling; the act of walking and it does not occur naturally; forgetful with names; racing heart beat.  She has read that some tumors are not detected by MRI and is requesting MRA.  She is very concerned about symptoms because she does not feel like herself and is worried something is affecting her brain.  She did not seek psychiatric of psychological counseling which I recommended at the last visit.  She denies any headache or worsening symptoms with coughing, straining, or coughing.    Medications:  Current Outpatient Prescriptions on File Prior to Visit  Medication Sig Dispense Refill  . albuterol (PROVENTIL HFA;VENTOLIN HFA) 108 (90 Base) MCG/ACT inhaler Inhale 2 puffs into the lungs every 6 (six) hours as needed for wheezing or shortness of breath.    . beclomethasone (QVAR) 40 MCG/ACT inhaler Inhale 2 puffs into the lungs 2 (two) times daily. (Patient taking differently: Inhale 2 puffs into the lungs 2 (two) times daily as needed (shortness of breath). ) 1 Inhaler 12  . ibuprofen (ADVIL,MOTRIN) 200 MG tablet Take 200 mg by mouth daily as needed for headache.    . Multiple Vitamin (MULTIVITAMIN WITH MINERALS) TABS Take 1 tablet by mouth daily.     Current Facility-Administered Medications on File Prior to  Visit  Medication Dose Route Frequency Provider Last Rate Last Dose  . albuterol (PROVENTIL) (2.5 MG/3ML) 0.083% nebulizer solution 2.5 mg  2.5 mg Nebulization Once Wardell Honour, MD        Allergies: No Known Allergies  Review of Systems:  CONSTITUTIONAL: No fevers, chills, night sweats, or weight loss.  EYES: +visual changes or eye pain ENT: No hearing changes.  No history of nose bleeds.   RESPIRATORY: No cough, wheezing and shortness  of breath.  CARDIOVASCULAR: Negative for chest pain, +palpitations.   GI: Negative for abdominal discomfort, blood in stools or black stools.  No recent change in bowel habits.   GU:  No history of incontinence.   MUSCLOSKELETAL: +history of joint pain or swelling.  No myalgias.   SKIN: Negative for lesions, rash, and itching.   ENDOCRINE: Negative for cold or heat intolerance, polydipsia or goiter.   PSYCH:  + depression +anxiety symptoms.   NEURO: As Above.   Vital Signs:  BP 104/70   Pulse 74   Ht 5' 1.5" (1.562 m)   Wt 192 lb 1 oz (87.1 kg)   LMP 06/30/2016   SpO2 97%   BMI 35.70 kg/m   Neurological Exam: MENTAL STATUS including orientation to time, place, person, recent and remote memory, attention span and concentration, language, and fund of knowledge is normal.  Speech is not dysarthric.  CRANIAL NERVES: Pupils equal round and reactive to light.  Normal conjugate, extra-ocular eye movements in all directions of gaze.  No ptosis. Normal facial sensation.  Face is symmetric. Palate elevates symmetrically.  Tongue is midline.  MOTOR:  Motor strength is 5/5 in all extremities.  No atrophy, fasciculations or abnormal movements.  No pronator drift.  Tone is normal.    MSRs:  Reflexes are 2+/4 throughout, except 1+/4 in the lower extremities  SENSORY:  Intact to temperature and vibration throughout.  COORDINATION/GAIT:  Normal finger-to- nose-finger and heel-to-shin.  Intact rapid alternating movements bilaterally.  Gait narrow based and stable.  Tandem gait intact.  Data: MRI brain wo contrast 05/08/2013: 1. Extensive anterior paranasal sinusitis. 2. Normal MRI appearance of the brain.  MRI brain wo contrast 02/11/2013: Equivocal MRI brain (without) demonstrating: 1. Bilateral maxillary and ethmoid sinus disease. There is complete opacification of the left maxillary sinus with fluid and bubbly secretions, air fluid level in the right maxillary sinus, and inflammation and  fluid ethmoid sinuses. 2. Otherwise normal brain parenchyma.  MRI brain wwo contrast 05/29/2015:   Normal  Lab Results  Component Value Date   TSH 2.68 12/19/2015   Lab Results  Component Value Date   VITAMINB12 1,037 07/04/2016    IMPRESSION/PLAN: Ms. Ruise is a 40 year-old female returning for follow-up with fleeting and migratory neurological symptoms including head tingling, generalized paresthesias, pulsating of the scalp, and forgetfulness.  Her exam remains non-focal.  There is a high degree of anxiety that no one is able to explain her symptoms.  She had had four prior MRI brain, all normal.  She is specifically requesting I order MRA head to look at her blood vessels, because she wants to be sure that there is nothing abnormal.  With her symptoms, I stressed that it is very unlikely that she has an aneurysm that would explain all her symptoms.  She is very persistent that she would like MRA and after much discussion, I offered CTA head, which would give the same information and be a more cost-efficient alternative, although my overall suspicion that she  has anything neurologically worrisome is low.  Again, I do not feel that she needs any more imaging.  I reassured her that she does not have features on her history or exam to suggest aneurysm.  More importantly, she needs to have her anxiety addressed and is agreeable to seeing behavior therapy.  Unfortunately, I do not have anything else to offer and she may want to seek a second opinion.    The duration of this appointment visit was 30 minutes of face-to-face time with the patient.  Greater than 50% of this time was spent in counseling, explanation of diagnosis, planning of further management, and coordination of care.   Thank you for allowing me to participate in patient's care.  If I can answer any additional questions, I would be pleased to do so.    Sincerely,    Onnie Alatorre K. Posey Pronto, DO

## 2016-08-12 ENCOUNTER — Ambulatory Visit
Admission: RE | Admit: 2016-08-12 | Discharge: 2016-08-12 | Disposition: A | Discharge status: Home / Self Care | Payer: Federal, State, Local not specified - PPO | Source: Ambulatory Visit | Attending: Neurology | Admitting: Neurology

## 2016-08-12 DIAGNOSIS — F411 Generalized anxiety disorder: Secondary | ICD-10-CM

## 2016-08-12 DIAGNOSIS — R202 Paresthesia of skin: Secondary | ICD-10-CM

## 2016-08-12 DIAGNOSIS — R200 Anesthesia of skin: Secondary | ICD-10-CM | POA: Diagnosis not present

## 2016-08-12 DIAGNOSIS — R209 Unspecified disturbances of skin sensation: Secondary | ICD-10-CM

## 2016-08-12 MED ORDER — IOPAMIDOL (ISOVUE-300) INJECTION 61%
75.0000 mL | Freq: Once | INTRAVENOUS | Status: AC | PRN
  Administered 2016-08-12: 75 mL via INTRAVENOUS

## 2016-08-13 ENCOUNTER — Telehealth: Payer: Self-pay | Admitting: Neurology

## 2016-08-13 NOTE — Telephone Encounter (Signed)
Patient given results

## 2016-08-13 NOTE — Telephone Encounter (Signed)
Patient wants to know the test result are please call [phone]

## 2016-08-13 NOTE — Telephone Encounter (Signed)
CTA head done yesterday.

## 2016-08-13 NOTE — Telephone Encounter (Signed)
CTA head is normal and does not show any abnormalities of her blood vessels.  There is mild sinus disease but this would not explain any of her symptoms.  Donika K. Posey Pronto, DO

## 2016-08-19 ENCOUNTER — Other Ambulatory Visit: Payer: Self-pay | Admitting: General Surgery

## 2016-08-19 ENCOUNTER — Other Ambulatory Visit: Payer: Federal, State, Local not specified - PPO

## 2016-08-19 DIAGNOSIS — N6452 Nipple discharge: Secondary | ICD-10-CM | POA: Diagnosis not present

## 2016-08-19 DIAGNOSIS — L819 Disorder of pigmentation, unspecified: Secondary | ICD-10-CM | POA: Diagnosis not present

## 2016-08-20 ENCOUNTER — Other Ambulatory Visit: Payer: Self-pay | Admitting: General Surgery

## 2016-08-20 ENCOUNTER — Ambulatory Visit: Payer: Federal, State, Local not specified - PPO | Admitting: Family Medicine

## 2016-08-20 DIAGNOSIS — N6452 Nipple discharge: Secondary | ICD-10-CM

## 2016-08-23 ENCOUNTER — Ambulatory Visit: Payer: Federal, State, Local not specified - PPO | Admitting: Neurology

## 2016-08-26 ENCOUNTER — Other Ambulatory Visit: Payer: Federal, State, Local not specified - PPO

## 2016-09-02 ENCOUNTER — Ambulatory Visit: Payer: Federal, State, Local not specified - PPO | Admitting: Cardiology

## 2016-09-02 ENCOUNTER — Other Ambulatory Visit: Payer: Federal, State, Local not specified - PPO

## 2016-09-09 ENCOUNTER — Other Ambulatory Visit: Payer: Federal, State, Local not specified - PPO

## 2016-09-16 ENCOUNTER — Other Ambulatory Visit: Payer: Federal, State, Local not specified - PPO

## 2016-09-17 ENCOUNTER — Ambulatory Visit: Payer: Federal, State, Local not specified - PPO | Admitting: Family Medicine

## 2016-10-01 ENCOUNTER — Ambulatory Visit
Admission: RE | Admit: 2016-10-01 | Discharge: 2016-10-01 | Disposition: A | Discharge status: Home / Self Care | Payer: Federal, State, Local not specified - PPO | Source: Ambulatory Visit | Attending: General Surgery | Admitting: General Surgery

## 2016-10-01 DIAGNOSIS — N6452 Nipple discharge: Secondary | ICD-10-CM

## 2016-10-01 DIAGNOSIS — R922 Inconclusive mammogram: Secondary | ICD-10-CM | POA: Diagnosis not present

## 2016-10-08 ENCOUNTER — Encounter: Payer: Self-pay | Admitting: Family Medicine

## 2016-10-08 ENCOUNTER — Ambulatory Visit (INDEPENDENT_AMBULATORY_CARE_PROVIDER_SITE_OTHER): Payer: Federal, State, Local not specified - PPO | Admitting: Family Medicine

## 2016-10-08 VITALS — BP 108/68 | HR 90 | Temp 98.9°F | Ht 61.5 in | Wt 198.4 lb

## 2016-10-08 DIAGNOSIS — L309 Dermatitis, unspecified: Secondary | ICD-10-CM | POA: Diagnosis not present

## 2016-10-08 DIAGNOSIS — F411 Generalized anxiety disorder: Secondary | ICD-10-CM | POA: Diagnosis not present

## 2016-10-08 DIAGNOSIS — F1010 Alcohol abuse, uncomplicated: Secondary | ICD-10-CM | POA: Diagnosis not present

## 2016-10-08 DIAGNOSIS — R202 Paresthesia of skin: Secondary | ICD-10-CM | POA: Diagnosis not present

## 2016-10-08 NOTE — Progress Notes (Signed)
Subjective:    Patient ID: Sherri Buckley, female    DOB: 23-Mar-1976, 41 y.o.   MRN: [id number]  10/08/2016  Chills (pt states - chills and pluse in both legs)   HPI This 41 y.o. female presents for her ninth acute visit for chills and pulse in both legs.  Still having chilling sensation in legs and pulsating sensations in head.  Follow-up with neurology again; non-focal exam.  Ordered CT angio negative.  S/p MRI x 4 in past few years; WNL.  Still feeling these sensations in head; mostly L>R; eye will twitch on L>R.  LEFT eye is twitching; strong chilling sensation on L head. Also getting chilling sensation in legs.  Neurology could not offer further studies; this was second neurology consultation for second opinion.  Recommended follow-up with primary care.  Also felt pricking sensation on L side.  None of symptoms are painful yet not normal.  Three months ago, at hair salon and felt warm sensation in chest. Not sure if blood pressure dropped; similar sensation developed in chest.  Felt like needed to sit and rest.  Just wants to sleep to avoid feeling these sensations.  Just working and sleeping.  Neurology recommended behavioral health.  Does not feel stress every day.  I also recommended counseling at last visit but pt has not started.  Has been to [facility] and [facility].  No associated weakness or focal weakness.  Has felt faint twice but no syncope.  No exercising; afraid to exercise.  S/p cardiology evaluation in 12/2014; s/p 2D-echo; also admitted.   No yoga lately.  Afraid to exercise; afraid may pass out. Gets a warmness in chest; duration of warmness less than 5 minutes.  First episode was sitting down at hair salon.  Twitches in legs throughout the day.  Also feels twitches in head.   Myocardial stress testing in 12/2014 low risk.  Taking MVI; has been taking for the past year. Might miss on days off.   Has urinary frequency; s/p urology consultation; diagnosed with overactive  bladder; prescribed abx with improvement.  Was initially urinating every 20 minutes.  Now improved.  CT angio WNL other than sinusitis ethmoid; will get sinus pain or headache sometimes.  Minimal to no rhinorrhea or nasal congestion.  Blowing nose in morning at times; no daily symptoms.   CT angio 08/12/16 WNL [facility] MRI brain 05/02/2016 WNL [facility] MRI brain 05/2015 WNL [facility] MRI brain 05/2013 WNL  Went to ophthalmology; no tumors in eyes. Overeater as well.    Only worries excessively over health. Work really helps patient; work is a Chemical engineer from physical symptoms. Has only been sleeping two hours; usually has no problems sleeping.  For last four days, no problems sleeping. But for past two weeks, only sleeping 4-6 hours per day.  Has had several ED visits in the past few years due to symptoms; work up negative; has talked self out of ED visits recently; Has gotten so bad lately.  Five ED visits in the past year.  One ED visit the year prior.  Expensive to go to ED.  Immunization History  Administered Date(s) Administered  . Tdap 02/13/2015   BP Readings from Last 3 Encounters:  10/08/16 108/68  08/05/16 104/70  07/09/16 102/76   Wt Readings from Last 3 Encounters:  10/08/16 198 lb 6.4 oz (90 kg)  08/05/16 192 lb 1 oz (87.1 kg)  07/09/16 187 lb 12.8 oz (85.2 kg)    Review of Systems  Constitutional:  Negative for chills, diaphoresis, fatigue and fever.  Eyes: Negative for visual disturbance.  Respiratory: Negative for cough and shortness of breath.   Cardiovascular: Negative for chest pain, palpitations and leg swelling.  Gastrointestinal: Negative for abdominal pain, constipation, diarrhea, nausea and vomiting.  Endocrine: Negative for cold intolerance, heat intolerance, polydipsia, polyphagia and polyuria.  Musculoskeletal: Positive for myalgias.  Skin: Positive for rash.  Neurological: Positive for numbness. Negative for dizziness, tremors, seizures, syncope, facial asymmetry,  speech difficulty, weakness, light-headedness and headaches.    Past Medical History:  Diagnosis Date  . Allergy   . Asthma   . Blood type, Rh negative   . Chronic bronchitis ([place])    "get it q yr"  . Depression 10/07/1998  . [id number])    "monthly" (12/21/2014)  . Osteoarthritis of ankle, right   . Seasonal allergies   . Substance abuse    Past Surgical History:  Procedure Laterality Date  . BREAST BIOPSY Right ~ 1993   "took out some benign tissue"   No Known Allergies Current Outpatient Prescriptions  Medication Sig Dispense Refill  . albuterol (PROVENTIL HFA;VENTOLIN HFA) 108 (90 Base) MCG/ACT inhaler Inhale 2 puffs into the lungs every 6 (six) hours as needed for wheezing or shortness of breath.    . beclomethasone (QVAR) 40 MCG/ACT inhaler Inhale 2 puffs into the lungs 2 (two) times daily. (Patient taking differently: Inhale 2 puffs into the lungs 2 (two) times daily as needed (shortness of breath). ) 1 Inhaler 12  . ibuprofen (ADVIL,MOTRIN) 200 MG tablet Take 200 mg by mouth daily as needed for headache.    . Multiple Vitamin (MULTIVITAMIN WITH MINERALS) TABS Take 1 tablet by mouth daily.     Current Facility-Administered Medications  Medication Dose Route Frequency Provider Last Rate Last Dose  . albuterol (PROVENTIL) (2.5 MG/3ML) 0.083% nebulizer solution 2.5 mg  2.5 mg Nebulization Once Wardell Honour, MD       Social History   Social History  . Marital status: Single    Spouse name: N/A  . Number of children: N/A  . Years of education: N/A   Occupational History  .  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 Smoking status: Current Some Day Smoker  . Smokeless tobacco: Never Used     Comment: blacky milds  . Alcohol use 0.0 oz/week     Comment: 12/21/2014 "quit drinking in 2013"  . Drug use: Yes    Types: Marijuana     Comment: "quit smoking marijuana in 2013"  . Sexual activity: No   Other Topics Concern  . Not on  file   Social History Narrative   Marital status: single; not dating      Children:  None      Lives: alone in house.      Employment: Actor x 11 years; clerk processing; happy; 3rd shift.       Tobacco:  Cigars once or twice per week.      Alcohol: drinks beer 5 per weekl; previous alcoholism; stopped drinking heavily 2014; previous counseling; went through EAP with work.      Drugs:  None; h/o marijuana; rare use now.      Exercise:  None      Seatbelt:  100%      Guns:  None      Sexual activity:  Total partners = 25; history of crabs; dates males only.  Last STD screening 4  years ago.     Family History  Problem Relation Age of Onset  . Heart disease Maternal Grandmother     9 bypass surgery  . Cancer Maternal Grandmother     mouth  . Sickle cell trait Father   . Seizures Father   . Alcohol abuse Father   . Thyroid disease Mother     as a child  . Hypertension Mother   . Anemia Mother   . Migraines Mother   . Hernia Mother   . Diabetes Mother   . Hypertension Paternal Grandmother   . Diabetes Paternal Grandmother   . Arthritis Maternal Aunt        Objective:    BP 108/68 (BP Location: Right Arm, Patient Position: Sitting, Cuff Size: Large)   Pulse 90   Temp 98.9 F (37.2 C) (Oral)   Ht 5' 1.5" (1.562 m)   Wt 198 lb 6.4 oz (90 kg)   LMP 10/06/2016 (Exact Date)   SpO2 100%   BMI 36.88 kg/m  Physical Exam  Constitutional: She is oriented to person, place, and time. She appears well-developed and well-nourished. No distress.  HENT:  Head: Normocephalic and atraumatic.  Right Ear: External ear normal.  Left Ear: External ear normal.  Nose: Nose normal.  Mouth/Throat: Oropharynx is clear and moist.  Eyes: Conjunctivae and EOM are normal. Pupils are equal, round, and reactive to light.  Neck: Normal range of motion. Neck supple. Carotid bruit is not present. No thyromegaly present.  Cardiovascular: Normal rate, regular rhythm, normal heart sounds and  intact distal pulses.  Exam reveals no gallop and no friction rub.   No murmur heard. Pulmonary/Chest: Effort normal and breath sounds normal. She has no wheezes. She has no rales.  Abdominal: Soft. Bowel sounds are normal. She exhibits no distension and no mass. There is no tenderness. There is no rebound and no guarding.  Lymphadenopathy:    She has no cervical adenopathy.  Neurological: She is alert and oriented to person, place, and time. No cranial nerve deficit. She exhibits normal muscle tone. Coordination normal.  Skin: Skin is warm and dry. Rash noted. She is not diaphoretic. No erythema. No pallor.  Psychiatric: She has a normal mood and affect. Her behavior is normal. Judgment and thought content normal.   Results for orders placed or performed in visit on 07/04/16  Vitamin B12  Result Value Ref Range   Vitamin B-12 1,037 200 - 1,100 pg/mL  Comprehensive metabolic panel  Result Value Ref Range   Sodium 137 135 - 146 mmol/L   Potassium 4.1 3.5 - 5.3 mmol/L   Chloride 102 98 - 110 mmol/L   CO2 24 20 - 31 mmol/L   Glucose, Bld 88 65 - 99 mg/dL   BUN 14 7 - 25 mg/dL   Creat 0.69 0.50 - 1.10 mg/dL   Total Bilirubin 0.6 0.2 - 1.2 mg/dL   Alkaline Phosphatase 44 33 - 115 U/L   AST 19 10 - 30 U/L   ALT 14 6 - 29 U/L   Total Protein 7.1 6.1 - 8.1 g/dL   Albumin 3.9 3.6 - 5.1 g/dL   Calcium 9.4 8.6 - 10.2 mg/dL  POCT CBC  Result Value Ref Range   WBC 8.7 4.6 - 10.2 K/uL   Lymph, poc 2.1 0.6 - 3.4   POC LYMPH PERCENT 24.5 10 - 50 %L   MID (cbc) 0.8 0 - 0.9   POC MID % 9.1 0 - 12 %M  POC Granulocyte 5.8 2 - 6.9   Granulocyte percent 66.4 37 - 80 %G   RBC 4.43 4.04 - 5.48 M/uL   Hemoglobin 12.2 12.2 - 16.2 g/dL   HCT, POC 34.6 (A) 37.7 - 47.9 %   MCV 78.0 (A) 80 - 97 fL   MCH, POC 27.6 27 - 31.2 pg   MCHC 35.4 31.8 - 35.4 g/dL   RDW, POC 14.8 %   Platelet Count, POC 317 142 - 424 K/uL   MPV 8.9 0 - 99.8 fL   Depression screen [facility] 2/9 10/08/2016 07/09/2016 07/04/2016  03/19/2016 02/26/2016  Decreased Interest 0 0 0 0 0  Down, Depressed, Hopeless 0 0 0 0 0  PHQ - 2 Score 0 0 0 0 0       Assessment & Plan:   1. Generalized anxiety disorder   2. Paresthesias   3. Dermatitis   4. Alcohol abuse    -discussed potential anxiety as etiology to paresthesias; has undergone nine different medical evaluations in the past 1.5 years with an extensive work up including extensive lab work, multiple MRIs, lower extremity doppler, cardiology consultations, neurology consultations.  No etiology to symptoms.  I highly recommend treatment for anxiety; pt is very resistant to medication yet has expressed willingness to undergo psychotherapy yet has been non-compliant with this approach; pt agreeable to counseling at this time. -I also highly recommend exercise for treatment of stress and anxiety.  Pt has been afraid to exercise; does not want heart rate to elevate in fear of something bad happening. I expressed to patient that we are to exercise regularly and have heart rate elevate for good cardiovascular fitness.   -refer to dermatology for chronic intermittent dermatitis. -history of alcoholism yet pt continues to consume; encourage cessation again during visit. -prolonged face to face for 30 minutes with 50% of time dedicated to counseling, review of medical record, and coordination of care.    Orders Placed This Encounter  Procedures  . Ambulatory referral to Psychology    Referral Priority:   Routine    Referral Type:   Psychiatric    Referral Reason:   Specialty Services Required    Requested Specialty:   Psychology    Number of Visits Requested:   1  . Ambulatory referral to Dermatology    Referral Priority:   Routine    Referral Type:   Consultation    Referral Reason:   Specialty Services Required    Requested Specialty:   Dermatology    Number of Visits Requested:   1   No orders of the defined types were placed in this encounter.   No Follow-up on  file.   Cort Dragoo Elayne Guerin, M.D. [facility] [address] [place], [place]  [address] [phone] phone [phone] fax

## 2016-10-08 NOTE — Patient Instructions (Addendum)
1. Start counseling immediately 2.  Start walking around neighborhood four days per week for 30 minutes.    IF you received an x-ray today, you will receive an invoice from [facility]. Please contact [facility] at [phone] with questions or concerns regarding your invoice.   IF you received labwork today, you will receive an invoice from [facility]. Please contact [facility] at [phone] with questions or concerns regarding your invoice.   Our billing staff will not be able to assist you with questions regarding bills from these companies.  You will be contacted with the lab results as soon as they are available. The fastest way to get your results is to activate your My Chart account. Instructions are located on the last page of this paperwork. If you have not heard from [place] regarding the results in 2 weeks, please contact this office.    Generalized Anxiety Disorder Generalized anxiety disorder (GAD) is a mental disorder. It interferes with life functions, including relationships, work, and school. GAD is different from normal anxiety, which everyone experiences at some point in their lives in response to specific life events and activities. Normal anxiety actually helps [place] prepare for and get through these life events and activities. Normal anxiety goes away after the event or activity is over.  GAD causes anxiety that is not necessarily related to specific events or activities. It also causes excess anxiety in proportion to specific events or activities. The anxiety associated with GAD is also difficult to control. GAD can vary from mild to severe. People with severe GAD can have intense waves of anxiety with physical symptoms (panic attacks).  SYMPTOMS The anxiety and worry associated with GAD are difficult to control. This anxiety and worry are related to many life events and activities and also occur more days than not for 6 months or longer. People with GAD also have  three or more of the following symptoms (one or more in children):  Restlessness.   Fatigue.  Difficulty concentrating.   Irritability.  Muscle tension.  Difficulty sleeping or unsatisfying sleep. DIAGNOSIS GAD is diagnosed through an assessment by your health care provider. Your health care provider will ask you questions aboutyour mood,physical symptoms, and events in your life. Your health care provider may ask you about your medical history and use of alcohol or drugs, including prescription medicines. Your health care provider may also do a physical exam and blood tests. Certain medical conditions and the use of certain substances can cause symptoms similar to those associated with GAD. Your health care provider may refer you to a mental health specialist for further evaluation. TREATMENT The following therapies are usually used to treat GAD:   Medication. Antidepressant medication usually is prescribed for long-term daily control. Antianxiety medicines may be added in severe cases, especially when panic attacks occur.   Talk therapy (psychotherapy). Certain types of talk therapy can be helpful in treating GAD by providing support, education, and guidance. A form of talk therapy called cognitive behavioral therapy can teach you healthy ways to think about and react to daily life events and activities.  Stress managementtechniques. These include yoga, meditation, and exercise and can be very helpful when they are practiced regularly. A mental health specialist can help determine which treatment is best for you. Some people see improvement with one therapy. However, other people require a combination of therapies. This information is not intended to replace advice given to you by your health care provider. Make sure you discuss any questions you  have with your health care provider. Document Released: 01/18/2013 Document Revised: 10/14/2014 Document Reviewed: 01/18/2013 Elsevier  Interactive Patient Education  2017 [employer].

## 2016-10-14 ENCOUNTER — Encounter (HOSPITAL_COMMUNITY): Payer: Self-pay

## 2016-10-21 ENCOUNTER — Encounter (HOSPITAL_COMMUNITY): Payer: Self-pay | Admitting: Licensed Clinical Social Worker

## 2016-10-21 ENCOUNTER — Ambulatory Visit (INDEPENDENT_AMBULATORY_CARE_PROVIDER_SITE_OTHER): Payer: Federal, State, Local not specified - PPO | Admitting: Licensed Clinical Social Worker

## 2016-10-21 DIAGNOSIS — F411 Generalized anxiety disorder: Secondary | ICD-10-CM
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
## 2016-10-28 DIAGNOSIS — N643 Galactorrhea not associated with childbirth: Secondary | ICD-10-CM | POA: Insufficient documentation

## 2016-10-28 DIAGNOSIS — N6452 Nipple discharge: Secondary | ICD-10-CM | POA: Diagnosis not present

## 2016-10-28 DIAGNOSIS — L819 Disorder of pigmentation, unspecified: Secondary | ICD-10-CM | POA: Diagnosis not present

## 2016-10-29 ENCOUNTER — Other Ambulatory Visit: Payer: Self-pay | Admitting: General Surgery

## 2016-10-29 DIAGNOSIS — N6452 Nipple discharge: Secondary | ICD-10-CM

## 2016-11-01 ENCOUNTER — Encounter: Payer: Self-pay | Admitting: Family Medicine

## 2016-11-18 ENCOUNTER — Ambulatory Visit (HOSPITAL_COMMUNITY): Payer: Self-pay | Admitting: Licensed Clinical Social Worker

## 2016-11-19 ENCOUNTER — Ambulatory Visit: Payer: Federal, State, Local not specified - PPO | Admitting: Family Medicine

## 2016-11-19 ENCOUNTER — Ambulatory Visit (INDEPENDENT_AMBULATORY_CARE_PROVIDER_SITE_OTHER): Payer: Federal, State, Local not specified - PPO | Admitting: Family Medicine

## 2016-11-19 ENCOUNTER — Encounter: Payer: Self-pay | Admitting: Family Medicine

## 2016-11-19 VITALS — BP 122/72 | HR 80 | Temp 98.3°F | Ht 61.5 in | Wt 200.2 lb

## 2016-11-19 DIAGNOSIS — J111 Influenza due to unidentified influenza virus with other respiratory manifestations: Secondary | ICD-10-CM

## 2016-11-19 DIAGNOSIS — L243 Irritant contact dermatitis due to cosmetics: Secondary | ICD-10-CM

## 2016-11-19 DIAGNOSIS — H5713 Ocular pain, bilateral: Secondary | ICD-10-CM

## 2016-11-19 DIAGNOSIS — G514 Facial myokymia: Secondary | ICD-10-CM | POA: Diagnosis not present

## 2016-11-19 DIAGNOSIS — R202 Paresthesia of skin: Secondary | ICD-10-CM

## 2016-11-19 DIAGNOSIS — F411 Generalized anxiety disorder: Secondary | ICD-10-CM

## 2016-11-19 MED ORDER — OSELTAMIVIR PHOSPHATE 75 MG PO CAPS: 75.0000 mg | ORAL_CAPSULE | Freq: Two times a day (BID) | ORAL | 0 refills | Status: DC

## 2016-11-19 MED ORDER — TRIAMCINOLONE ACETONIDE 0.1 % EX CREA: 1.0000 "application " | TOPICAL_CREAM | Freq: Two times a day (BID) | CUTANEOUS | 0 refills | Status: DC

## 2016-11-19 NOTE — Patient Instructions (Addendum)
  Recommend vitamin C 1000mg  daily with Zinc.   IF you received an x-ray today, you will receive an invoice from [facility] Radiology. Please contact [facility] at [phone] with questions or concerns regarding your invoice.   IF you received labwork today, you will receive an invoice from [facility]. Please contact [facility] at [phone] with questions or concerns regarding your invoice.   Our billing staff will not be able to assist you with questions regarding bills from these companies.  You will be contacted with the lab results as soon as they are available. The fastest way to get your results is to activate your My Chart account. Instructions are located on the last page of this paperwork. If you have not heard from [place] regarding the results in 2 weeks, please contact this office.      Influenza, Adult Influenza ("the flu") is an infection in the lungs, nose, and throat (respiratory tract). It is caused by a virus. The flu causes many common cold symptoms, as well as a high fever and body aches. It can make you feel very sick. The flu spreads easily from person to person (is contagious). Getting a flu shot (influenza vaccination) every year is the best way to prevent the flu. Follow these instructions at home:  Take over-the-counter and prescription medicines only as told by your doctor.  Use a cool mist humidifier to add moisture (humidity) to the air in your home. This can make it easier to breathe.  Rest as needed.  Drink enough fluid to keep your pee (urine) clear or pale yellow.  Cover your mouth and nose when you cough or sneeze.  Wash your hands with soap and water often, especially after you cough or sneeze. If you cannot use soap and water, use hand sanitizer.  Stay home from work or school as told by your doctor. Unless you are visiting your doctor, try to avoid leaving home until your fever has been gone for 24 hours without the use of medicine.  Keep  all follow-up visits as told by your doctor. This is important. How is this prevented?  Getting a yearly (annual) flu shot is the best way to avoid getting the flu. You may get the flu shot in late summer, fall, or winter. Ask your doctor when you should get your flu shot.  Wash your hands often or use hand sanitizer often.  Avoid contact with people who are sick during cold and flu season.  Eat healthy foods.  Drink plenty of fluids.  Get enough sleep.  Exercise regularly. Contact a doctor if:  You get new symptoms.  You have:  Chest pain.  Watery poop (diarrhea).  A fever.  Your cough gets worse.  You start to have more mucus.  You feel sick to your stomach (nauseous).  You throw up (vomit). Get help right away if:  You start to be short of breath or have trouble breathing.  Your skin or nails turn a bluish color.  You have very bad pain or stiffness in your neck.  You get a sudden headache.  You get sudden pain in your face or ear.  You cannot stop throwing up. This information is not intended to replace advice given to you by your health care provider. Make sure you discuss any questions you have with your health care provider. Document Released: 07/02/2008 Document Revised: 02/29/2016 Document Reviewed: 07/18/2015 Elsevier Interactive Patient Education  2017 [employer].

## 2016-11-19 NOTE — Progress Notes (Signed)
Subjective:    Patient ID: Sherri Buckley, female    DOB: 04-14-1976, 41 y.o.   MRN: [id number]  11/19/2016  Nasal Congestion (X 3 day); Follow-up (anxiety); Diarrhea (X 2 days); and Cough (X 2 days)   HPI This 42 y.o. female presents for evaluation of fever, body aches, diarrhea, cough, rhinorrhea, sneezing.  +fever Tmax 101.3.  +sweats; no chills.  +body aches; back was hurting really badly on first day of onset.  No body aches since then.  +HA but improved.  No ear pain or sore throat.  +rhinorrhea; +sneezing. +slight cough.  No SOB.  +diarrhea x 4-5 yesterday.  No n/v.  Taking Robitussin Severe Cold & flu every 4 hours; sets timer.   Rash on R side of neck. Onset two weeks ago and then gone; intermittent.  Mild itching B facial region.  Right after washing face and applying moisturizer.  No new facial products; no new foods.  Ate shrimp last night.  No tongue swelling; no SOB.  Anxiety: s/p psychology evaluation in January 2018; appointment this month. Continues to have paresthesias intermittently. Had episode of chest warmness while performing yoga.  Neurology released patient.  Requesting another referral for third opinion.  Still getting chilling/pulsing/pricking sensations.  S/p lumbar spine films in past.  Now having pain with sensations; severity 2/10.  Overwhelming feeling that something bad is going to happen.  Has seen two neurologists.  Feels that has balance issues yet no falls.   Really desires a third neurology consultation.  Very concerned with ongoing paresthesias.    Eye twitching: onset in past two weeks; denies stressors; also having striking pains in eyes with eye twitching intermittently.  Normal vision.   Review of Systems  Constitutional: Negative for chills, diaphoresis, fatigue and fever.  Eyes: Negative for visual disturbance.  Respiratory: Negative for cough and shortness of breath.   Cardiovascular: Negative for chest pain, palpitations and leg swelling.    Gastrointestinal: Negative for abdominal pain, constipation, diarrhea, nausea and vomiting.  Endocrine: Negative for cold intolerance, heat intolerance, polydipsia, polyphagia and polyuria.  Musculoskeletal: Negative for arthralgias, back pain, gait problem and joint swelling.  Neurological: Positive for numbness. Negative for dizziness, tremors, seizures, syncope, facial asymmetry, speech difficulty, weakness, light-headedness and headaches.  Psychiatric/Behavioral: Negative for dysphoric mood and sleep disturbance. The patient is not nervous/anxious.     Past Medical History:  Diagnosis Date  . Allergy   . Asthma   . Blood type, Rh negative   . Chronic bronchitis ([place])    "get it q yr"  . Depression 10/07/1998  . [id number])    "monthly" (12/21/2014)  . Osteoarthritis of ankle, right   . Seasonal allergies   . Substance abuse    Past Surgical History:  Procedure Laterality Date  . BREAST BIOPSY Right ~ 1993   "took out some benign tissue"   No Known Allergies  Social History   Social History  . Marital status: Single    Spouse name: N/A  . Number of children: N/A  . Years of education: N/A   Occupational History  .  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 Smoking status: Current Some Day Smoker  . Smokeless tobacco: Never Used     Comment: blacky milds  . Alcohol use 0.0 oz/week     Comment: 12/21/2014 "quit drinking in 2013"  . Drug use: Yes    Types: Marijuana     Comment: "  quit smoking marijuana in 2013"  . Sexual activity: No   Other Topics Concern  . Not on file   Social History Narrative   Marital status: single; not dating      Children:  None      Lives: alone in house.      Employment: Actor x 11 years; clerk processing; happy; 3rd shift.       Tobacco:  Cigars once or twice per week.      Alcohol: drinks beer 5 per weekl; previous alcoholism; stopped drinking heavily 2014; previous counseling; went  through EAP with work.      Drugs:  None; h/o marijuana; rare use now.      Exercise:  None      Seatbelt:  100%      Guns:  None      Sexual activity:  Total partners = 25; history of crabs; dates males only.  Last STD screening 4 years ago.     Family History  Problem Relation Age of Onset  . Heart disease Maternal Grandmother     9 bypass surgery  . Cancer Maternal Grandmother     mouth  . Sickle cell trait Father   . Seizures Father   . Alcohol abuse Father   . Thyroid disease Mother     as a child  . Hypertension Mother   . Anemia Mother   . Migraines Mother   . Hernia Mother   . Diabetes Mother   . Hypertension Paternal Grandmother   . Diabetes Paternal Grandmother   . Arthritis Maternal Aunt        Objective:    BP 122/72 (BP Location: Right Arm, Patient Position: Sitting, Cuff Size: Large)   Pulse 80   Temp 98.3 F (36.8 C) (Oral)   Ht 5' 1.5" (1.562 m)   Wt 200 lb 3.2 oz (90.8 kg)   LMP 11/02/2016 (Exact Date)   SpO2 99%   BMI 37.21 kg/m  Physical Exam  Constitutional: She is oriented to person, place, and time. She appears well-developed and well-nourished. No distress.  HENT:  Head: Normocephalic and atraumatic.  Right Ear: External ear normal.  Left Ear: External ear normal.  Nose: Nose normal.  Mouth/Throat: Oropharynx is clear and moist.  Eyes: Conjunctivae and EOM are normal. Pupils are equal, round, and reactive to light.  Neck: Normal range of motion. Neck supple. Carotid bruit is not present. No thyromegaly present.  Cardiovascular: Normal rate, regular rhythm, normal heart sounds and intact distal pulses.  Exam reveals no gallop and no friction rub.   No murmur heard. Pulmonary/Chest: Effort normal and breath sounds normal. She has no wheezes. She has no rales.  Abdominal: Soft. Bowel sounds are normal. She exhibits no distension and no mass. There is no tenderness. There is no rebound and no guarding.  Musculoskeletal:       Right shoulder:  Normal.       Left shoulder: Normal.       Cervical back: Normal.       Thoracic back: Normal.       Lumbar back: Normal.  Lymphadenopathy:    She has no cervical adenopathy.  Neurological: She is alert and oriented to person, place, and time. No cranial nerve deficit. She exhibits normal muscle tone. Coordination normal.  Skin: Skin is warm and dry. No rash noted. She is not diaphoretic. No erythema. No pallor.  Psychiatric: She has a normal mood and affect. Her behavior is normal. Judgment  and thought content normal.   Results for orders placed or performed in visit on 07/04/16  Vitamin B12  Result Value Ref Range   Vitamin B-12 1,037 200 - 1,100 pg/mL  Comprehensive metabolic panel  Result Value Ref Range   Sodium 137 135 - 146 mmol/L   Potassium 4.1 3.5 - 5.3 mmol/L   Chloride 102 98 - 110 mmol/L   CO2 24 20 - 31 mmol/L   Glucose, Bld 88 65 - 99 mg/dL   BUN 14 7 - 25 mg/dL   Creat 0.69 0.50 - 1.10 mg/dL   Total Bilirubin 0.6 0.2 - 1.2 mg/dL   Alkaline Phosphatase 44 33 - 115 U/L   AST 19 10 - 30 U/L   ALT 14 6 - 29 U/L   Total Protein 7.1 6.1 - 8.1 g/dL   Albumin 3.9 3.6 - 5.1 g/dL   Calcium 9.4 8.6 - 10.2 mg/dL  POCT CBC  Result Value Ref Range   WBC 8.7 4.6 - 10.2 K/uL   Lymph, poc 2.1 0.6 - 3.4   POC LYMPH PERCENT 24.5 10 - 50 %L   MID (cbc) 0.8 0 - 0.9   POC MID % 9.1 0 - 12 %M   POC Granulocyte 5.8 2 - 6.9   Granulocyte percent 66.4 37 - 80 %G   RBC 4.43 4.04 - 5.48 M/uL   Hemoglobin 12.2 12.2 - 16.2 g/dL   HCT, POC 34.6 (A) 37.7 - 47.9 %   MCV 78.0 (A) 80 - 97 fL   MCH, POC 27.6 27 - 31.2 pg   MCHC 35.4 31.8 - 35.4 g/dL   RDW, POC 14.8 %   Platelet Count, POC 317 142 - 424 K/uL   MPV 8.9 0 - 99.8 fL   Depression screen [facility] 2/9 11/19/2016 10/08/2016 07/09/2016 07/04/2016 03/19/2016  Decreased Interest 0 0 0 0 0  Down, Depressed, Hopeless 0 0 0 0 0  PHQ - 2 Score 0 0 0 0 0   Fall Risk  11/19/2016 10/08/2016 08/05/2016 07/09/2016 04/29/2016  Falls in the past year?  No No No No No       Assessment & Plan:   1. Influenza   2. Irritant contact dermatitis due to cosmetics   3. Generalized anxiety disorder   4. Facial twitching   5. Pain of both eyes   6. Paresthesias    -new onset influenza; rx for Tamiflu; recommend Mucinex DM, Tylenol, and rest.  RTC for acute decline. -new onset contact dermatitis; rx for Triamcinolone provided. -s/p one counseling session; suffering with excessive worry over health; continue therapy; refusing SSRI. -suffering with eye twitching and eye pain; to undergo ophthalmology evaluation. -pt requesting third opinion from neurology; recommend undergoing ongoing psychotherapy for 2-3 months. If no improvement at that time with paresthesias, will consider referral to neurology.   Orders Placed This Encounter  Procedures  . Ambulatory referral to Ophthalmology    Referral Priority:   Routine    Referral Type:   Consultation    Referral Reason:   Specialty Services Required    Requested Specialty:   Ophthalmology    Number of Visits Requested:   1   Meds ordered this encounter  Medications  . oseltamivir (TAMIFLU) 75 MG capsule    Sig: Take 1 capsule (75 mg total) by mouth 2 (two) times daily.    Dispense:  10 capsule    Refill:  0  . triamcinolone cream (KENALOG) 0.1 %    Sig: Apply  1 application topically 2 (two) times daily.    Dispense:  30 g    Refill:  0    Return in about 2 months (around 01/17/2017) for recheck.   Johnryan Sao Elayne Guerin, M.D. Primary Care at [facility] previously [facility] [address] [place], [place]  [address] [phone] phone [phone] fax

## 2016-11-21 ENCOUNTER — Telehealth: Payer: Self-pay

## 2016-11-21 NOTE — Telephone Encounter (Addendum)
PATIENT WAS IN AND SAW DR. Tamala Julian ON Tuesday AND DIAGNOSED WITH THE FLU. SHE SAID HER NOSE IS STILL VERY CONGESTED AND SHE IS SNEEZING A LOT. SHE IS TAKING THE ROBITUSSIN EVERY 4 HOURS. SHE ALSO HAS TAKEN 3 OF HER TAMIFLU SO FAR. WHAT ELSE CAN SHE USE TO HELP HER NASAL CONGESTION? SHE ALSO NEEDS A WORK NOTE. SHE WAS OUT FROM TUES. 11/19/16 UNTIL SUN. 11/24/16. SHE WILL RETURN TO WORK ON MON. 11/25/16. BEST PHONE [phone] (CELL) PHARMACY CHOICE IS [facility] ON [address]. [place]

## 2016-11-21 NOTE — Telephone Encounter (Signed)
Can we give work note and suggestions for congestion? Nasal saline?

## 2016-11-25 NOTE — Telephone Encounter (Signed)
Recommend Afrin nasal spray 2 sprays into each nostril twice daily.  Also recommend nasal saline spray as needed.  Work note written.

## 2016-12-09 ENCOUNTER — Encounter: Payer: Self-pay | Admitting: Family Medicine

## 2016-12-09 ENCOUNTER — Other Ambulatory Visit: Payer: Self-pay

## 2016-12-09 ENCOUNTER — Ambulatory Visit
Admission: RE | Admit: 2016-12-09 | Discharge: 2016-12-09 | Disposition: A | Discharge status: Home / Self Care | Payer: Federal, State, Local not specified - PPO | Source: Ambulatory Visit | Attending: General Surgery | Admitting: General Surgery

## 2016-12-09 DIAGNOSIS — N6452 Nipple discharge: Secondary | ICD-10-CM

## 2016-12-09 DIAGNOSIS — Z01419 Encounter for gynecological examination (general) (routine) without abnormal findings: Secondary | ICD-10-CM | POA: Diagnosis not present

## 2016-12-09 MED ORDER — GADOBENATE DIMEGLUMINE 529 MG/ML IV SOLN
18.0000 mL | Freq: Once | INTRAVENOUS | Status: AC | PRN
  Administered 2016-12-09: 18 mL via INTRAVENOUS

## 2016-12-10 ENCOUNTER — Ambulatory Visit (HOSPITAL_COMMUNITY): Payer: Self-pay | Admitting: Licensed Clinical Social Worker

## 2016-12-11 ENCOUNTER — Telehealth: Payer: Self-pay | Admitting: General Surgery

## 2016-12-11 NOTE — Telephone Encounter (Signed)
Discussed MRI results and recommendations with patient.  Will have staff get orders in. Pt requests mondays for procedures.

## 2016-12-13 NOTE — Telephone Encounter (Signed)
Referrals --- Pt desires to see Dr. Tonia Brooms for dermatology referral.

## 2016-12-19 ENCOUNTER — Other Ambulatory Visit: Payer: Self-pay | Admitting: General Surgery

## 2016-12-19 DIAGNOSIS — R928 Other abnormal and inconclusive findings on diagnostic imaging of breast: Secondary | ICD-10-CM

## 2016-12-20 ENCOUNTER — Telehealth: Payer: Self-pay | Admitting: Family Medicine

## 2016-12-20 ENCOUNTER — Ambulatory Visit
Admission: RE | Admit: 2016-12-20 | Discharge: 2016-12-20 | Disposition: A | Discharge status: Home / Self Care | Payer: Federal, State, Local not specified - PPO | Source: Ambulatory Visit | Attending: General Surgery | Admitting: General Surgery

## 2016-12-20 ENCOUNTER — Other Ambulatory Visit: Payer: Self-pay | Admitting: General Surgery

## 2016-12-20 ENCOUNTER — Other Ambulatory Visit: Payer: Self-pay

## 2016-12-20 DIAGNOSIS — N6489 Other specified disorders of breast: Secondary | ICD-10-CM | POA: Diagnosis not present

## 2016-12-20 DIAGNOSIS — N6320 Unspecified lump in the left breast, unspecified quadrant: Secondary | ICD-10-CM

## 2016-12-20 DIAGNOSIS — R928 Other abnormal and inconclusive findings on diagnostic imaging of breast: Secondary | ICD-10-CM

## 2016-12-20 NOTE — Telephone Encounter (Signed)
MyChart message sent to patient about her 03/18/17 appointment with Dr Tamala Julian, she needs to reschedule this appointment since Dr Tamala Julian will be out of the office that day.

## 2016-12-24 ENCOUNTER — Other Ambulatory Visit: Payer: Self-pay | Admitting: General Surgery

## 2016-12-24 DIAGNOSIS — N6489 Other specified disorders of breast: Secondary | ICD-10-CM | POA: Diagnosis not present

## 2016-12-24 DIAGNOSIS — L219 Seborrheic dermatitis, unspecified: Secondary | ICD-10-CM | POA: Diagnosis not present

## 2016-12-24 DIAGNOSIS — N6322 Unspecified lump in the left breast, upper inner quadrant: Secondary | ICD-10-CM | POA: Diagnosis not present

## 2016-12-24 DIAGNOSIS — N6320 Unspecified lump in the left breast, unspecified quadrant: Secondary | ICD-10-CM

## 2016-12-24 DIAGNOSIS — L658 Other specified nonscarring hair loss: Secondary | ICD-10-CM | POA: Diagnosis not present

## 2016-12-25 ENCOUNTER — Inpatient Hospital Stay: Admission: RE | Admit: 2016-12-25 | Payer: Self-pay | Source: Ambulatory Visit

## 2016-12-25 ENCOUNTER — Other Ambulatory Visit: Payer: Self-pay

## 2016-12-31 ENCOUNTER — Ambulatory Visit
Admission: RE | Admit: 2016-12-31 | Discharge: 2016-12-31 | Disposition: A | Discharge status: Home / Self Care | Payer: Federal, State, Local not specified - PPO | Source: Ambulatory Visit | Attending: General Surgery | Admitting: General Surgery

## 2016-12-31 DIAGNOSIS — R928 Other abnormal and inconclusive findings on diagnostic imaging of breast: Secondary | ICD-10-CM

## 2016-12-31 DIAGNOSIS — N6322 Unspecified lump in the left breast, upper inner quadrant: Secondary | ICD-10-CM | POA: Diagnosis not present

## 2016-12-31 DIAGNOSIS — N6320 Unspecified lump in the left breast, unspecified quadrant: Secondary | ICD-10-CM

## 2016-12-31 DIAGNOSIS — N6489 Other specified disorders of breast: Secondary | ICD-10-CM | POA: Diagnosis not present

## 2016-12-31 DIAGNOSIS — N6011 Diffuse cystic mastopathy of right breast: Secondary | ICD-10-CM | POA: Diagnosis not present

## 2016-12-31 MED ORDER — GADOBENATE DIMEGLUMINE 529 MG/ML IV SOLN
18.0000 mL | Freq: Once | INTRAVENOUS | Status: AC | PRN
  Administered 2016-12-31: 18 mL via INTRAVENOUS

## 2017-01-07 ENCOUNTER — Ambulatory Visit (HOSPITAL_COMMUNITY): Payer: Self-pay | Admitting: Licensed Clinical Social Worker

## 2017-01-21 ENCOUNTER — Ambulatory Visit (INDEPENDENT_AMBULATORY_CARE_PROVIDER_SITE_OTHER): Payer: Federal, State, Local not specified - PPO | Admitting: Family Medicine

## 2017-01-21 ENCOUNTER — Encounter: Payer: Self-pay | Admitting: Family Medicine

## 2017-01-21 VITALS — BP 122/75 | HR 65 | Temp 98.0°F | Resp 16 | Ht 61.5 in | Wt 189.6 lb

## 2017-01-21 DIAGNOSIS — F1010 Alcohol abuse, uncomplicated: Secondary | ICD-10-CM

## 2017-01-21 DIAGNOSIS — Z6835 Body mass index (BMI) 35.0-35.9, adult: Secondary | ICD-10-CM | POA: Diagnosis not present

## 2017-01-21 DIAGNOSIS — R202 Paresthesia of skin: Secondary | ICD-10-CM | POA: Diagnosis not present

## 2017-01-21 DIAGNOSIS — J301 Allergic rhinitis due to pollen: Secondary | ICD-10-CM | POA: Diagnosis not present

## 2017-01-21 DIAGNOSIS — E6609 Other obesity due to excess calories: Secondary | ICD-10-CM

## 2017-01-21 DIAGNOSIS — F418 Other specified anxiety disorders: Secondary | ICD-10-CM | POA: Diagnosis not present

## 2017-01-21 NOTE — Progress Notes (Signed)
Subjective:    Patient ID: Sherri Buckley, female    DOB: [birth year], 41 y.o.   MRN: [id number]  01/21/2017  Follow-up (anxiety )   HPI This 41 y.o. female presents for three month follow-up for anxiety, paresthesias.  Has only seen therapist once; now therapist can no longer see pt on Mondays; needs to switch to another therapist; will not allow pt to see a different therapist in the same practice.  Cold and warm sensations in head starting for the past five days.  Coldness in head and spine and feet intermittently; about to drive pt crazy.  Stopped eating meat and poultry and dairy; eating seafood.  Online recommended checking levels including B12 and vitamin levels.  Started new diet 11/26/16; two month duration; requesting labs.  Wants to speak to neurologist about head.  Just wants to neurologist about ongoing symptoms.  s/p multiple MRIs x 4 in the past; also has seen two neurologists in the past.  Will also continue to feel warmness in chest but then resolves.  Usually occurs with sitting.  Only medication is MVI for past year.  No blurred vision unless taking a nap and occurs upon awakening; three episodes at work following a nap.  No diplopia.  No headaches.  No difficulties swallowing.  Will have word-finding difficulties; will say one word when wants to say another.  No dizziness.  No slurred speech.  No falls.  No head trauma or concussions.  Normal bowel and bladder function. Did see urologist in past year for urinary frequency; s/p urodynamic studies; dx OAB.  Now having neck coldness and warmness.    Will drink two beers on day off; will drink a bottle on day off.    With [facility], has been out of power.  Should get power restored in upcoming two days.    CT angio head in 08/12/2016 by Dr. Posey Pronto of neurology. Lumbar spine films 05/2016 due to leg pain, tingling.   MRI brain 04/2016 [facility]. MRI brain 05/2015 [facility]el. MRI brain in 05/2013 [facility] of ED medicine.  NM Myocardial  with EF in 12/2014.   Previous neurology consultation in 2014 with Rexene Alberts of [facility]. No previous NCS; no MRI of cervical spine or lumbar spine.   Review of Systems  Constitutional: Negative for chills, diaphoresis, fatigue and fever.  Eyes: Negative for visual disturbance.  Respiratory: Negative for cough and shortness of breath.   Cardiovascular: Negative for chest pain, palpitations and leg swelling.  Gastrointestinal: Negative for abdominal pain, constipation, diarrhea, nausea and vomiting.  Endocrine: Negative for cold intolerance, heat intolerance, polydipsia, polyphagia and polyuria.  Genitourinary: Positive for frequency and urgency. Negative for decreased urine volume and difficulty urinating.  Neurological: Positive for numbness. Negative for dizziness, tremors, seizures, syncope, facial asymmetry, speech difficulty, weakness, light-headedness and headaches.    Past Medical History:  Diagnosis Date  . Allergy   . Asthma   . Blood type, Rh negative   . Chronic bronchitis ([place])    "get it q yr"  . Depression 10/07/1998  . EUMPNTIR(443.1)    "monthly" (12/21/2014)  . Osteoarthritis of ankle, right   . Seasonal allergies   . Substance abuse    Past Surgical History:  Procedure Laterality Date  . BREAST BIOPSY Right ~ 1993   "took out some benign tissue"   No Known Allergies Current Outpatient Prescriptions  Medication Sig Dispense Refill  . albuterol (PROVENTIL HFA;VENTOLIN HFA) 108 (90 Base) MCG/ACT inhaler Inhale 2 puffs into the  lungs every 6 (six) hours as needed for wheezing or shortness of breath.    . Multiple Vitamin (MULTIVITAMIN WITH MINERALS) TABS Take 1 tablet by mouth daily.     Current Facility-Administered Medications  Medication Dose Route Frequency Provider Last Rate Last Dose  . albuterol (PROVENTIL) (2.5 MG/3ML) 0.083% nebulizer solution 2.5 mg  2.5 mg Nebulization Once Wardell Honour, MD       Social History   Social History  . Marital  status: Single    Spouse name: N/A  . Number of children: N/A  . Years of education: N/A   Occupational History  .  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 Smoking status: Current Some Day Smoker  . Smokeless tobacco: Never Used     Comment: blacky milds  . Alcohol use 0.0 oz/week     Comment: 12/21/2014 "quit drinking in 2013"  . Drug use: Yes    Types: Marijuana     Comment: "quit smoking marijuana in 2013"  . Sexual activity: No   Other Topics Concern  . Not on file   Social History Narrative   Marital status: single; not dating      Children:  None      Lives: alone in house.      Employment: Actor x 11 years; clerk processing; happy; 3rd shift.       Tobacco:  Cigars once or twice per week.      Alcohol: drinks beer 5 per weekl; previous alcoholism; stopped drinking heavily 2014; previous counseling; went through EAP with work.      Drugs:  None; h/o marijuana; rare use now.      Exercise:  None      Seatbelt:  100%      Guns:  None      Sexual activity:  Total partners = 25; history of crabs; dates males only.  Last STD screening 4 years ago.     Family History  Problem Relation Age of Onset  . Heart disease Maternal Grandmother        9 bypass surgery  . Cancer Maternal Grandmother        mouth  . Sickle cell trait Father   . Seizures Father   . Alcohol abuse Father   . Thyroid disease Mother        as a child  . Hypertension Mother   . Anemia Mother   . Migraines Mother   . Hernia Mother   . Diabetes Mother   . Hypertension Paternal Grandmother   . Diabetes Paternal Grandmother   . Arthritis Maternal Aunt        Objective:    BP 122/75 (BP Location: Right Arm, Patient Position: Sitting, Cuff Size: Large)   Pulse 65   Temp 98 F (36.7 C) (Oral)   Resp 16   Ht 5' 1.5" (1.562 m)   Wt 189 lb 9.6 oz (86 kg)   SpO2 100%   BMI 35.24 kg/m  Physical Exam  Constitutional: She is oriented to person, place, and  time. She appears well-developed and well-nourished. No distress.  HENT:  Head: Normocephalic and atraumatic.  Right Ear: External ear normal.  Left Ear: External ear normal.  Nose: Nose normal.  Mouth/Throat: Oropharynx is clear and moist.  Eyes: Conjunctivae and EOM are normal. Pupils are equal, round, and reactive to light.  Neck: Normal range of motion. Neck supple. Carotid bruit is not present.  No thyromegaly present.  Cardiovascular: Normal rate, regular rhythm, normal heart sounds and intact distal pulses.  Exam reveals no gallop and no friction rub.   No murmur heard. Pulmonary/Chest: Effort normal and breath sounds normal. She has no wheezes. She has no rales.  Abdominal: Soft. Bowel sounds are normal. She exhibits no distension and no mass. There is no tenderness. There is no rebound and no guarding.  Lymphadenopathy:    She has no cervical adenopathy.  Neurological: She is alert and oriented to person, place, and time. No cranial nerve deficit. Coordination normal.  Skin: Skin is warm and dry. No rash noted. She is not diaphoretic. No erythema. No pallor.  Psychiatric: She has a normal mood and affect. Her behavior is normal. Judgment and thought content normal.   Depression screen [facility] 2/9 01/21/2017 11/19/2016 10/08/2016 07/09/2016 07/04/2016  Decreased Interest 0 0 0 0 0  Down, Depressed, Hopeless - 0 0 0 0  PHQ - 2 Score 0 0 0 0 0   Fall Risk  01/21/2017 11/19/2016 10/08/2016 08/05/2016 07/09/2016  Falls in the past year? No No No No No        Assessment & Plan:   1. Paresthesias   2. Seasonal allergic rhinitis due to pollen   3. Class 2 obesity due to excess calories without serious comorbidity with body mass index (BMI) of 35.0 to 35.9 in adult   4. Alcohol abuse   5. Anxiety about health    -persistent transient paresthesias.  Recent worsening and different/new features to paresthesias; desires a second opinion on current symptoms. Pt particularly concerned with  persistence of symptoms without a definitive dx; refer to neurology. -acute worsening of allergic rhinitis; recommend oral antihistamine of choice and nasal steroid of choice. -recommend exercise and weight loss due to obesity. -pt suffers with excessive worry regarding health status; s/p one single psychology consultation yet has not returned for ongoing counseling; denies generalized anxiety.   -history of alcoholism yet continues to drink "small amounts" throughout the week; expressed concern regarding ongoing alcohol intake and recommend cessation. -obtain labs to rule out secondary causes of paresthesias.   Orders Placed This Encounter  Procedures  . CBC with Differential/Platelet  . Comprehensive metabolic panel  . Iron  . TSH  . Vitamin B12  . Vitamin B1  . Folate RBC  . Ambulatory referral to Neurology    Referral Priority:   Routine    Referral Type:   Consultation    Referral Reason:   Specialty Services Required    Requested Specialty:   Neurology    Number of Visits Requested:   1   No orders of the defined types were placed in this encounter.   Return in about 4 months (around 05/23/2017).   Sunjai Levandoski Elayne Guerin, M.D. Primary Care at [facility] previously [facility] [address] [place], [place]  [address] [phone] phone [phone] fax

## 2017-01-21 NOTE — Patient Instructions (Addendum)
Start walking for cardiovascular health; goal to walk 3-4 days per week. Recommend establishing with new therapist.    Allergic Rhinitis Allergic rhinitis is when the mucous membranes in the nose respond to allergens. Allergens are particles in the air that cause your body to have an allergic reaction. This causes you to release allergic antibodies. Through a chain of events, these eventually cause you to release histamine into the blood stream. Although meant to protect the body, it is this release of histamine that causes your discomfort, such as frequent sneezing, congestion, and an itchy, runny nose. What are the causes? Seasonal allergic rhinitis (hay fever) is caused by pollen allergens that may come from grasses, trees, and weeds. Year-round allergic rhinitis (perennial allergic rhinitis) is caused by allergens such as house dust mites, pet dander, and mold spores. What are the signs or symptoms?  Nasal stuffiness (congestion).  Itchy, runny nose with sneezing and tearing of the eyes. How is this diagnosed? Your health care provider can help you determine the allergen or allergens that trigger your symptoms. If you and your health care provider are unable to determine the allergen, skin or blood testing may be used. Your health care provider will diagnose your condition after taking your health history and performing a physical exam. Your health care provider may assess you for other related conditions, such as asthma, pink eye, or an ear infection. How is this treated? Allergic rhinitis does not have a cure, but it can be controlled by:  Medicines that block allergy symptoms. These may include allergy shots, nasal sprays, and oral antihistamines.  Avoiding the allergen. Hay fever may often be treated with antihistamines in pill or nasal spray forms. Antihistamines block the effects of histamine. There are over-the-counter medicines that may help with nasal congestion and swelling  around the eyes. Check with your health care provider before taking or giving this medicine. If avoiding the allergen or the medicine prescribed do not work, there are many new medicines your health care provider can prescribe. Stronger medicine may be used if initial measures are ineffective. Desensitizing injections can be used if medicine and avoidance does not work. Desensitization is when a patient is given ongoing shots until the body becomes less sensitive to the allergen. Make sure you follow up with your health care provider if problems continue. Follow these instructions at home: It is not possible to completely avoid allergens, but you can reduce your symptoms by taking steps to limit your exposure to them. It helps to know exactly what you are allergic to so that you can avoid your specific triggers. Contact a health care provider if:  You have a fever.  You develop a cough that does not stop easily (persistent).  You have shortness of breath.  You start wheezing.  Symptoms interfere with normal daily activities. This information is not intended to replace advice given to you by your health care provider. Make sure you discuss any questions you have with your health care provider. Document Released: 06/18/2001 Document Revised: 05/24/2016 Document Reviewed: 05/31/2013 Elsevier Interactive Patient Education  2017 [employer].    IF you received an x-ray today, you will receive an invoice from [facility]. Please contact [facility] at [phone] with questions or concerns regarding your invoice.   IF you received labwork today, you will receive an invoice from [facility]. Please contact [facility] at [phone] with questions or concerns regarding your invoice.   Our billing staff will not be able to assist you with  questions regarding bills from these companies.  You will be contacted with the lab results as soon as they are available. The fastest way to get  your results is to activate your My Chart account. Instructions are located on the last page of this paperwork. If you have not heard from [place] regarding the results in 2 weeks, please contact this office.

## 2017-01-24 LAB — CBC WITH DIFFERENTIAL/PLATELET
BASOS ABS: 0 10*3/uL (ref 0.0–0.2)
Basos: 0 %
EOS (ABSOLUTE): 0.3 10*3/uL (ref 0.0–0.4)
Eos: 5 %
HEMATOCRIT: 35.9 % (ref 34.0–46.6)
HEMOGLOBIN: 11.5 g/dL (ref 11.1–15.9)
IMMATURE GRANS (ABS): 0 10*3/uL (ref 0.0–0.1)
Immature Granulocytes: 0 %
LYMPHS ABS: 1.9 10*3/uL (ref 0.7–3.1)
LYMPHS: 26 %
MCH: 25.6 pg — AB (ref 26.6–33.0)
MCHC: 32 g/dL (ref 31.5–35.7)
MCV: 80 fL (ref 79–97)
MONOCYTES: 8 %
Monocytes Absolute: 0.5 10*3/uL (ref 0.1–0.9)
NEUTROS ABS: 4.4 10*3/uL (ref 1.4–7.0)
Neutrophils: 61 %
Platelets: 345 10*3/uL (ref 150–379)
RBC: 4.5 x10E6/uL (ref 3.77–5.28)
RDW: 15.6 % — ABNORMAL HIGH (ref 12.3–15.4)
WBC: 7.1 10*3/uL (ref 3.4–10.8)

## 2017-01-24 LAB — COMPREHENSIVE METABOLIC PANEL
ALBUMIN: 4 g/dL (ref 3.5–5.5)
ALT: 18 IU/L (ref 0–32)
AST: 16 IU/L (ref 0–40)
Albumin/Globulin Ratio: 1.3 (ref 1.2–2.2)
Alkaline Phosphatase: 54 IU/L (ref 39–117)
BILIRUBIN TOTAL: 0.7 mg/dL (ref 0.0–1.2)
BUN / CREAT RATIO: 15 (ref 9–23)
BUN: 11 mg/dL (ref 6–24)
CO2: 25 mmol/L (ref 18–29)
CREATININE: 0.73 mg/dL (ref 0.57–1.00)
Calcium: 9 mg/dL (ref 8.7–10.2)
Chloride: 103 mmol/L (ref 96–106)
GFR calc non Af Amer: 102 mL/min/{1.73_m2} (ref >59)
GFR, EST AFRICAN AMERICAN: 117 mL/min/{1.73_m2} (ref >59)
GLOBULIN, TOTAL: 3 g/dL (ref 1.5–4.5)
GLUCOSE: 93 mg/dL (ref 65–99)
Potassium: 4.1 mmol/L (ref 3.5–5.2)
SODIUM: 142 mmol/L (ref 134–144)
Total Protein: 7 g/dL (ref 6.0–8.5)

## 2017-01-24 LAB — FOLATE RBC
FOLATE, HEMOLYSATE: 485.9 ng/mL
Folate, RBC: 1353 ng/mL (ref >498)

## 2017-01-24 LAB — VITAMIN B1: THIAMINE: 98.8 nmol/L (ref 66.5–200.0)

## 2017-01-24 LAB — IRON: Iron: 51 ug/dL (ref 27–159)

## 2017-01-24 LAB — VITAMIN B12: Vitamin B-12: 815 pg/mL (ref 232–1245)

## 2017-01-24 LAB — TSH: TSH: 1.95 u[IU]/mL (ref 0.450–4.500)

## 2017-01-25 ENCOUNTER — Emergency Department (HOSPITAL_COMMUNITY)
Admission: EM | Admit: 2017-01-25 | Discharge: 2017-01-25 | Disposition: A | Discharge status: Home / Self Care | Payer: Federal, State, Local not specified - PPO | Attending: Emergency Medicine | Admitting: Emergency Medicine

## 2017-01-25 ENCOUNTER — Encounter (HOSPITAL_COMMUNITY): Payer: Self-pay

## 2017-01-25 DIAGNOSIS — F17200 Nicotine dependence, unspecified, uncomplicated: Secondary | ICD-10-CM | POA: Diagnosis not present

## 2017-01-25 DIAGNOSIS — J00 Acute nasopharyngitis [common cold]: Secondary | ICD-10-CM | POA: Diagnosis present

## 2017-01-25 DIAGNOSIS — T7840XA Allergy, unspecified, initial encounter: Secondary | ICD-10-CM

## 2017-01-25 DIAGNOSIS — J302 Other seasonal allergic rhinitis: Secondary | ICD-10-CM | POA: Diagnosis not present

## 2017-01-25 NOTE — ED Triage Notes (Addendum)
Patient complains of 10 days of feeling warmth to head, ears and toes. States that she has had tingling in past to same and had MRIs which were negative but the warmth is new. Alert and oriented, NAD

## 2017-01-25 NOTE — ED Provider Notes (Signed)
[place] DEPT Provider Note   CSN: [id number] Arrival date & time: 01/25/17  0911     History   Chief Complaint Chief Complaint  Patient presents with  . warmth to head/ears/toes    HPI Sherri Buckley is a 41 y.o. female.  Patient complains of sensation of warm and cold in her head. She visited her primary care doctor Tuesday and got blood work. An MRI of her brain in 2014 was normal. She claims a history of sinusitis. No fever, sweats, chills, stiff neck, neurological deficits. Severity of illness is mild.      Past Medical History:  Diagnosis Date  . Allergy   . Asthma   . Blood type, Rh negative   . Chronic bronchitis ([place])    "get it q yr"  . Depression 10/07/1998  . UUVOZDGU(440.3)    "monthly" (12/21/2014)  . Osteoarthritis of ankle, right   . Seasonal allergies   . Substance abuse     Patient Active Problem List   Diagnosis Date Noted  . Asthma 02/21/2016  . BMI 36.0-36.9,adult 02/21/2016  . Palpitation 12/22/2014  . Elevated troponin 12/21/2014  . Allergic rhinitis 03/06/2014  . Nipple discharge 06/21/2013  . Hypovitaminosis D 02/09/2013  . Alcohol abuse 02/09/2013    Past Surgical History:  Procedure Laterality Date  . BREAST BIOPSY Right ~ 1993   "took out some benign tissue"    OB History    Gravida Para Term Preterm AB Living   9 0     8 0   SAB TAB Ectopic Multiple Live Births   1               Home Medications    Prior to Admission medications   Medication Sig Start Date End Date Taking? Authorizing Provider  albuterol (PROVENTIL HFA;VENTOLIN HFA) 108 (90 Base) MCG/ACT inhaler Inhale 2 puffs into the lungs every 6 (six) hours as needed for wheezing or shortness of breath.    Historical Provider, MD  Multiple Vitamin (MULTIVITAMIN WITH MINERALS) TABS Take 1 tablet by mouth daily.    Historical Provider, MD    Family History Family History  Problem Relation Age of Onset  . Heart disease Maternal Grandmother     9 bypass  surgery  . Cancer Maternal Grandmother     mouth  . Sickle cell trait Father   . Seizures Father   . Alcohol abuse Father   . Thyroid disease Mother     as a child  . Hypertension Mother   . Anemia Mother   . Migraines Mother   . Hernia Mother   . Diabetes Mother   . Hypertension Paternal Grandmother   . Diabetes Paternal Grandmother   . Arthritis Maternal Aunt     Social History Social History  Substance Use Topics  . Smoking status: Current Some Day Smoker  . Smokeless tobacco: Never Used     Comment: blacky milds  . Alcohol use 0.0 oz/week     Comment: 12/21/2014 "quit drinking in 2013"     Allergies   Patient has no known allergies.   Review of Systems Review of Systems  All other systems reviewed and are negative.    Physical Exam Updated Vital Signs BP 128/74 (BP Location: Left Arm)   Pulse 87   Temp 98.5 F (36.9 C) (Oral)   Resp 20   SpO2 99%   Physical Exam  Constitutional: She is oriented to person, place, and time. She appears well-developed and well-nourished.  HENT:  Head: Normocephalic and atraumatic.  Eyes: Conjunctivae are normal.  Neck: Neck supple.  Cardiovascular: Normal rate and regular rhythm.   Pulmonary/Chest: Effort normal and breath sounds normal.  Abdominal: Soft. Bowel sounds are normal.  Musculoskeletal: Normal range of motion.  Neurological: She is alert and oriented to person, place, and time.  Skin: Skin is warm and dry.  Psychiatric: She has a normal mood and affect. Her behavior is normal.  Nursing note and vitals reviewed.    ED Treatments / Results  Labs (all labs ordered are listed, but only abnormal results are displayed) Labs Reviewed - No data to display  EKG  EKG Interpretation None       Radiology No results found.  Procedures Procedures (including critical care time)  Medications Ordered in ED Medications - No data to display   Initial Impression / Assessment and Plan / ED Course  I have  reviewed the triage vital signs and the nursing notes.  Pertinent labs & imaging results that were available during my care of the patient were reviewed by me and considered in my medical decision making (see chart for details).     Patient has normal physical exam. No evidence of meningitis or sinusitis. Recommend Claritin or Zyrtec for seasonal allergies.  Final Clinical Impressions(s) / ED Diagnoses   Final diagnoses:  Allergic state, initial encounter    New Prescriptions New Prescriptions   No medications on file     Nat Christen, MD 01/25/17 1313

## 2017-01-25 NOTE — Discharge Instructions (Signed)
Recommend Zyrtec or Claritin. Return for high fever, confusion, mental changes, stiff neck.

## 2017-01-27 ENCOUNTER — Telehealth: Payer: Self-pay | Admitting: Family Medicine

## 2017-01-27 NOTE — Telephone Encounter (Signed)
PATIENT STATES SHE SAW DR. Tamala Julian ABOUT A WEEK AGO AND SHE DID BLOOD WORK ON HER. SHE DOES NOT SEE THE LAB RESULTS IN MY-CHART YET SO SHE WOULD LIKE TO BE CALLED WITH THEM. BEST PHONE [phone] (CELL) PHARMACY CHOICE IS [facility] [address]. [place]

## 2017-01-27 NOTE — Telephone Encounter (Signed)
Pt advised of basically normal labs Any concerns?

## 2017-01-27 NOTE — Telephone Encounter (Signed)
No concerns; mychart message sent to patient.  Not sure if she checks MyChart.  Please call to confirm.

## 2017-01-28 ENCOUNTER — Ambulatory Visit: Payer: Federal, State, Local not specified - PPO | Admitting: Family Medicine

## 2017-02-16 ENCOUNTER — Other Ambulatory Visit: Payer: Self-pay | Admitting: Family Medicine

## 2017-02-16 DIAGNOSIS — J9801 Acute bronchospasm: Secondary | ICD-10-CM

## 2017-02-17 ENCOUNTER — Telehealth: Payer: Self-pay | Admitting: Family Medicine

## 2017-02-17 DIAGNOSIS — R4589 Other symptoms and signs involving emotional state: Secondary | ICD-10-CM | POA: Insufficient documentation

## 2017-02-17 DIAGNOSIS — F418 Other specified anxiety disorders: Secondary | ICD-10-CM | POA: Insufficient documentation

## 2017-02-17 NOTE — Telephone Encounter (Signed)
Pt is checking on status of her refill request for her inhaler  Best number [phone]

## 2017-02-18 ENCOUNTER — Telehealth: Payer: Self-pay | Admitting: Family Medicine

## 2017-02-18 NOTE — Telephone Encounter (Signed)
Pt called following up on 2 referrals. One for Neurology and one for Therapy. I let the pt know we sent her neurology referral to [facility] and provided the phone number for them to schedule. I did not see referral placed for therapy. If pt is to be referred to therapy, I will be happy to send that over once referral is placed. Thanks!

## 2017-02-22 NOTE — Telephone Encounter (Signed)
I am not sure what the patient is referring to.  She may be asking for the name of a different counselor/psychologist.  I am really not sure.  Can you clarify this with the patient?

## 2017-02-25 NOTE — Telephone Encounter (Signed)
Spoke with pt. She was asking for names of psychologists so I gave her a few that we generally send to. She said once she picked one she would call and find out if she needed referral and would let us know. Thanks!

## 2017-03-07 ENCOUNTER — Other Ambulatory Visit: Payer: Self-pay | Admitting: Obstetrics and Gynecology

## 2017-03-07 DIAGNOSIS — N6310 Unspecified lump in the right breast, unspecified quadrant: Secondary | ICD-10-CM | POA: Diagnosis not present

## 2017-03-10 DIAGNOSIS — R51 Headache: Secondary | ICD-10-CM

## 2017-03-10 DIAGNOSIS — J302 Other seasonal allergic rhinitis: Secondary | ICD-10-CM | POA: Diagnosis not present

## 2017-03-10 DIAGNOSIS — G8929 Other chronic pain: Secondary | ICD-10-CM | POA: Insufficient documentation

## 2017-03-10 DIAGNOSIS — J343 Hypertrophy of nasal turbinates: Secondary | ICD-10-CM | POA: Diagnosis not present

## 2017-03-10 DIAGNOSIS — J3489 Other specified disorders of nose and nasal sinuses: Secondary | ICD-10-CM | POA: Diagnosis not present

## 2017-03-17 ENCOUNTER — Ambulatory Visit
Admission: RE | Admit: 2017-03-17 | Discharge: 2017-03-17 | Disposition: A | Discharge status: Home / Self Care | Payer: Federal, State, Local not specified - PPO | Source: Ambulatory Visit | Attending: Obstetrics and Gynecology | Admitting: Obstetrics and Gynecology

## 2017-03-17 ENCOUNTER — Other Ambulatory Visit: Payer: Self-pay

## 2017-03-17 DIAGNOSIS — N6312 Unspecified lump in the right breast, upper inner quadrant: Secondary | ICD-10-CM | POA: Diagnosis not present

## 2017-03-17 DIAGNOSIS — N6310 Unspecified lump in the right breast, unspecified quadrant: Secondary | ICD-10-CM

## 2017-03-17 DIAGNOSIS — R922 Inconclusive mammogram: Secondary | ICD-10-CM | POA: Diagnosis not present

## 2017-03-17 DIAGNOSIS — N6311 Unspecified lump in the right breast, upper outer quadrant: Secondary | ICD-10-CM | POA: Diagnosis not present

## 2017-03-18 ENCOUNTER — Encounter: Payer: Federal, State, Local not specified - PPO | Admitting: Family Medicine

## 2017-04-03 DIAGNOSIS — R202 Paresthesia of skin: Secondary | ICD-10-CM | POA: Diagnosis not present

## 2017-04-29 ENCOUNTER — Encounter: Payer: Federal, State, Local not specified - PPO | Admitting: Family Medicine

## 2017-05-12 DIAGNOSIS — T7840XA Allergy, unspecified, initial encounter: Secondary | ICD-10-CM | POA: Diagnosis not present

## 2017-05-12 DIAGNOSIS — L282 Other prurigo: Secondary | ICD-10-CM | POA: Diagnosis not present

## 2017-05-19 DIAGNOSIS — R202 Paresthesia of skin: Secondary | ICD-10-CM | POA: Diagnosis not present

## 2017-05-19 DIAGNOSIS — M62838 Other muscle spasm: Secondary | ICD-10-CM | POA: Diagnosis not present

## 2017-05-23 DIAGNOSIS — E669 Obesity, unspecified: Secondary | ICD-10-CM | POA: Diagnosis not present

## 2017-05-23 DIAGNOSIS — M7989 Other specified soft tissue disorders: Secondary | ICD-10-CM | POA: Diagnosis not present

## 2017-05-23 DIAGNOSIS — R600 Localized edema: Secondary | ICD-10-CM | POA: Diagnosis not present

## 2017-05-23 DIAGNOSIS — M79605 Pain in left leg: Secondary | ICD-10-CM | POA: Diagnosis not present

## 2017-05-24 DIAGNOSIS — R002 Palpitations: Secondary | ICD-10-CM | POA: Diagnosis not present

## 2017-05-24 DIAGNOSIS — R7989 Other specified abnormal findings of blood chemistry: Secondary | ICD-10-CM | POA: Diagnosis not present

## 2017-05-24 DIAGNOSIS — M7989 Other specified soft tissue disorders: Secondary | ICD-10-CM | POA: Diagnosis not present

## 2017-05-24 DIAGNOSIS — R0602 Shortness of breath: Secondary | ICD-10-CM | POA: Diagnosis not present

## 2017-05-27 ENCOUNTER — Encounter: Payer: Self-pay | Admitting: Family Medicine

## 2017-05-27 ENCOUNTER — Ambulatory Visit (INDEPENDENT_AMBULATORY_CARE_PROVIDER_SITE_OTHER): Payer: Federal, State, Local not specified - PPO | Admitting: Family Medicine

## 2017-05-27 VITALS — BP 123/82 | HR 97 | Temp 98.0°F | Resp 16 | Ht 61.81 in | Wt 206.0 lb

## 2017-05-27 DIAGNOSIS — I83029 Varicose veins of left lower extremity with ulcer of unspecified site: Secondary | ICD-10-CM | POA: Diagnosis not present

## 2017-05-27 DIAGNOSIS — J9801 Acute bronchospasm: Secondary | ICD-10-CM | POA: Diagnosis not present

## 2017-05-27 DIAGNOSIS — E6609 Other obesity due to excess calories: Secondary | ICD-10-CM | POA: Diagnosis not present

## 2017-05-27 DIAGNOSIS — Z6837 Body mass index (BMI) 37.0-37.9, adult: Secondary | ICD-10-CM

## 2017-05-27 DIAGNOSIS — M7989 Other specified soft tissue disorders: Secondary | ICD-10-CM

## 2017-05-27 DIAGNOSIS — M79662 Pain in left lower leg: Secondary | ICD-10-CM | POA: Diagnosis not present

## 2017-05-27 DIAGNOSIS — L97929 Non-pressure chronic ulcer of unspecified part of left lower leg with unspecified severity: Secondary | ICD-10-CM | POA: Diagnosis not present

## 2017-05-27 MED ORDER — ALBUTEROL SULFATE HFA 108 (90 BASE) MCG/ACT IN AERS: 2.0000 | INHALATION_SPRAY | Freq: Four times a day (QID) | RESPIRATORY_TRACT | 1 refills | Status: DC | PRN

## 2017-05-27 MED ORDER — BECLOMETHASONE DIPROPIONATE 40 MCG/ACT IN AERS: INHALATION_SPRAY | RESPIRATORY_TRACT | 3 refills | Status: DC

## 2017-05-27 NOTE — Patient Instructions (Addendum)
   IF you received an x-ray today, you will receive an invoice from [facility]. Please contact [facility] at [phone] with questions or concerns regarding your invoice.   IF you received labwork today, you will receive an invoice from [facility]. Please contact [facility] at [phone] with questions or concerns regarding your invoice.   Our billing staff will not be able to assist you with questions regarding bills from these companies.  You will be contacted with the lab results as soon as they are available. The fastest way to get your results is to activate your My Chart account. Instructions are located on the last page of this paperwork. If you have not heard from us regarding the results in 2 weeks, please contact this office.     Calorie Counting for Weight Loss Calories are units of energy. Your body needs a certain amount of calories from food to keep you going throughout the day. When you eat more calories than your body needs, your body stores the extra calories as fat. When you eat fewer calories than your body needs, your body burns fat to get the energy it needs. Calorie counting means keeping track of how many calories you eat and drink each day. Calorie counting can be helpful if you need to lose weight. If you make sure to eat fewer calories than your body needs, you should lose weight. Ask your health care provider what a healthy weight is for you. For calorie counting to work, you will need to eat the right number of calories in a day in order to lose a healthy amount of weight per week. A dietitian can help you determine how many calories you need in a day and will give you suggestions on how to reach your calorie goal.  A healthy amount of weight to lose per week is usually 1-2 lb (0.5-0.9 kg). This usually means that your daily calorie intake should be reduced by 500-750 calories.  Eating 1,200 - 1,500 calories per day can help most women lose  weight.  Eating 1,500 - 1,800 calories per day can help most men lose weight. What is my plan? My goal is to have __________ calories per day. If I have this many calories per day, I should lose around __________ pounds per week. What do I need to know about calorie counting? In order to meet your daily calorie goal, you will need to:  Find out how many calories are in each food you would like to eat. Try to do this before you eat.  Decide how much of the food you plan to eat.  Write down what you ate and how many calories it had. Doing this is called keeping a food log. To successfully lose weight, it is important to balance calorie counting with a healthy lifestyle that includes regular activity. Aim for 150 minutes of moderate exercise (such as walking) or 75 minutes of vigorous exercise (such as running) each week. Where do I find calorie information?   The number of calories in a food can be found on a Nutrition Facts label. If a food does not have a Nutrition Facts label, try to look up the calories online or ask your dietitian for help. Remember that calories are listed per serving. If you choose to have more than one serving of a food, you will have to multiply the calories per serving by the amount of servings you plan to eat. For example, the label on a package of   bread might say that a serving size is 1 slice and that there are 90 calories in a serving. If you eat 1 slice, you will have eaten 90 calories. If you eat 2 slices, you will have eaten 180 calories. How do I keep a food log? Immediately after each meal, record the following information in your food log:  What you ate. Don't forget to include toppings, sauces, and other extras on the food.  How much you ate. This can be measured in cups, ounces, or number of items.  How many calories each food and drink had.  The total number of calories in the meal. Keep your food log near you, such as in a small notebook in your  pocket, or use a mobile app or website. Some programs will calculate calories for you and show you how many calories you have left for the day to meet your goal. What are some calorie counting tips?  Use your calories on foods and drinks that will fill you up and not leave you hungry:  Some examples of foods that fill you up are nuts and nut butters, vegetables, lean proteins, and high-fiber foods like whole grains. High-fiber foods are foods with more than 5 g fiber per serving.  Drinks such as sodas, specialty coffee drinks, alcohol, and juices have a lot of calories, yet do not fill you up.  Eat nutritious foods and avoid empty calories. Empty calories are calories you get from foods or beverages that do not have many vitamins or protein, such as candy, sweets, and soda. It is better to have a nutritious high-calorie food (such as an avocado) than a food with few nutrients (such as a bag of chips).  Know how many calories are in the foods you eat most often. This will help you calculate calorie counts faster.  Pay attention to calories in drinks. Low-calorie drinks include water and unsweetened drinks.  Pay attention to nutrition labels for "low fat" or "fat free" foods. These foods sometimes have the same amount of calories or more calories than the full fat versions. They also often have added sugar, starch, or salt, to make up for flavor that was removed with the fat.  Find a way of tracking calories that works for you. Get creative. Try different apps or programs if writing down calories does not work for you. What are some portion control tips?  Know how many calories are in a serving. This will help you know how many servings of a certain food you can have.  Use a measuring cup to measure serving sizes. You could also try weighing out portions on a kitchen scale. With time, you will be able to estimate serving sizes for some foods.  Take some time to put servings of different foods  on your favorite plates, bowls, and cups so you know what a serving looks like.  Try not to eat straight from a bag or box. Doing this can lead to overeating. Put the amount you would like to eat in a cup or on a plate to make sure you are eating the right portion.  Use smaller plates, glasses, and bowls to prevent overeating.  Try not to multitask (for example, watch TV or use your computer) while eating. If it is time to eat, sit down at a table and enjoy your food. This will help you to know when you are full. It will also help you to be aware of what you are eating and   how much you are eating. What are tips for following this plan? Reading food labels   Check the calorie count compared to the serving size. The serving size may be smaller than what you are used to eating.  Check the source of the calories. Make sure the food you are eating is high in vitamins and protein and low in saturated and trans fats. Shopping   Read nutrition labels while you shop. This will help you make healthy decisions before you decide to purchase your food.  Make a grocery list and stick to it. Cooking   Try to cook your favorite foods in a healthier way. For example, try baking instead of frying.  Use low-fat dairy products. Meal planning   Use more fruits and vegetables. Half of your plate should be fruits and vegetables.  Include lean proteins like poultry and fish. How do I count calories when eating out?  Ask for smaller portion sizes.  Consider sharing an entree and sides instead of getting your own entree.  If you get your own entree, eat only half. Ask for a box at the beginning of your meal and put the rest of your entree in it so you are not tempted to eat it.  If calories are listed on the menu, choose the lower calorie options.  Choose dishes that include vegetables, fruits, whole grains, low-fat dairy products, and lean protein.  Choose items that are boiled, broiled, grilled, or  steamed. Stay away from items that are buttered, battered, fried, or served with cream sauce. Items labeled "crispy" are usually fried, unless stated otherwise.  Choose water, low-fat milk, unsweetened iced tea, or other drinks without added sugar. If you want an alcoholic beverage, choose a lower calorie option such as a glass of wine or light beer.  Ask for dressings, sauces, and syrups on the side. These are usually high in calories, so you should limit the amount you eat.  If you want a salad, choose a garden salad and ask for grilled meats. Avoid extra toppings like bacon, cheese, or fried items. Ask for the dressing on the side, or ask for olive oil and vinegar or lemon to use as dressing.  Estimate how many servings of a food you are given. For example, a serving of cooked rice is  cup or about the size of half a baseball. Knowing serving sizes will help you be aware of how much food you are eating at restaurants. The list below tells you how big or small some common portion sizes are based on everyday objects:  1 oz-4 stacked dice.  3 oz-1 deck of cards.  1 tsp-1 die.  1 Tbsp- a ping-pong ball.  2 Tbsp-1 ping-pong ball.   cup- baseball.  1 cup-1 baseball. Summary  Calorie counting means keeping track of how many calories you eat and drink each day. If you eat fewer calories than your body needs, you should lose weight.  A healthy amount of weight to lose per week is usually 1-2 lb (0.5-0.9 kg). This usually means reducing your daily calorie intake by 500-750 calories.  The number of calories in a food can be found on a Nutrition Facts label. If a food does not have a Nutrition Facts label, try to look up the calories online or ask your dietitian for help.  Use your calories on foods and drinks that will fill you up, and not on foods and drinks that will leave you hungry.  Use smaller plates, glasses,   and bowls to prevent overeating. This information is not intended to  replace advice given to you by your health care provider. Make sure you discuss any questions you have with your health care provider. Document Released: 09/23/2005 Document Revised: 08/23/2016 Document Reviewed: 08/23/2016 Elsevier Interactive Patient Education  2017 Elsevier Inc.  

## 2017-05-27 NOTE — Progress Notes (Signed)
Subjective:    Patient ID: Sherri Buckley, female    DOB: 06-25-1976, 41 y.o.   MRN: [id number]  05/27/2017  Hospitalization Follow-up (pt was seen in the ER on 05/24/2017 for edema of the left leg, pt states she is still having swelling )   HPI This 41 y.o. female presents for LEFT leg and LEFT arm swelling.  Presented to ED on 05/23/17 and 05/24/17 for LEFT arm swelling and then LEFT leg swelling.  Prior to swelling in LEFT arm, had severe LEFT leg spasms.  With onset of swelling of LEFT arm, worried about DVT.  Performed doppler of LLE and negative.  Then developed LEFT leg swelling; LEFT arm doppler negative.  CT chest negative due to elevated D-dimer.  [facility] in [place].    Has gained weight so could be obesity.  No change in diet.  Has been using more salt.  Started taking salt shakers to work. Has been adding a lot of salt to boiled eggs.  Has varicose veins in LEFT leg >Right leg.  Stands half the night at work.  Mother with varicose veins.    Seeing [facility] Manuella Ghazi.  S/p NCS/EMG two weeks ago and WNL.  Returns in September 2018.  No MRI performed at this point; will await results of NCS to determine appropriate next steps.   When sleeping, wakes up gasping.  Not daily; occurs twice in last week.  Has been sleeping a lot to avoid sensations in head. Going to [facility] every Saturday.  Has gained 16 pounds in four months.  Last weight at [facility] is 206.      BP Readings from Last 3 Encounters:  05/30/17 117/81  05/27/17 123/82  01/25/17 130/76   Wt Readings from Last 3 Encounters:  05/30/17 202 lb 9.6 oz (91.9 kg)  05/27/17 206 lb (93.4 kg)  01/21/17 189 lb 9.6 oz (86 kg)   Immunization History  Administered Date(s) Administered  . Tdap 02/13/2015    Review of Systems  Constitutional: Negative for chills, diaphoresis, fatigue and fever.  Eyes: Negative for visual disturbance.  Respiratory: Negative for cough and shortness of breath.   Cardiovascular: Positive for leg  swelling. Negative for chest pain and palpitations.  Gastrointestinal: Negative for abdominal pain, constipation, diarrhea, nausea and vomiting.  Endocrine: Negative for cold intolerance, heat intolerance, polydipsia, polyphagia and polyuria.  Musculoskeletal: Positive for myalgias.  Neurological: Positive for numbness. Negative for dizziness, tremors, seizures, syncope, facial asymmetry, speech difficulty, weakness, light-headedness and headaches.    Past Medical History:  Diagnosis Date  . Allergy   . Asthma   . Blood type, Rh negative   . Chronic bronchitis ([place])    "get it q yr"  . Depression 10/07/1998  . YQMVHQIO(962.9)    "monthly" (12/21/2014)  . Osteoarthritis of ankle, right   . Seasonal allergies   . Substance abuse    Past Surgical History:  Procedure Laterality Date  . BREAST BIOPSY Right ~ 1993   "took out some benign tissue"   No Known Allergies Current Outpatient Prescriptions  Medication Sig Dispense Refill  . albuterol (PROVENTIL HFA;VENTOLIN HFA) 108 (90 Base) MCG/ACT inhaler Inhale 2 puffs into the lungs every 6 (six) hours as needed for wheezing or shortness of breath. 18 g 1  . beclomethasone (QVAR) 40 MCG/ACT inhaler INHALE TWO PUFFS INTO THE LUNGS TWO TIMES DAILY 3 Inhaler 3  . Multiple Vitamin (MULTIVITAMIN WITH MINERALS) TABS Take 1 tablet by mouth daily.    . fluticasone (FLONASE)  50 MCG/ACT nasal spray Place 2 sprays into both nostrils daily. 16 g 0   Current Facility-Administered Medications  Medication Dose Route Frequency Provider Last Rate Last Dose  . albuterol (PROVENTIL) (2.5 MG/3ML) 0.083% nebulizer solution 2.5 mg  2.5 mg Nebulization Once Wardell Honour, MD       Social History   Social History  . Marital status: Single    Spouse name: N/A  . Number of children: N/A  . Years of education: N/A   Occupational History  .  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 Smoking status: Current Some Day Smoker    . Smokeless tobacco: Never Used     Comment: blacky milds  . Alcohol use 0.0 oz/week     Comment: 12/21/2014 "quit drinking in 2013"  . Drug use: Yes    Types: Marijuana     Comment: "quit smoking marijuana in 2013"  . Sexual activity: No   Other Topics Concern  . Not on file   Social History Narrative   Marital status: single; not dating      Children:  None      Lives: alone in house.      Employment: Actor x 11 years; clerk processing; happy; 3rd shift.       Tobacco:  Cigars once or twice per week.      Alcohol: drinks beer 5 per weekl; previous alcoholism; stopped drinking heavily 2014; previous counseling; went through EAP with work.      Drugs:  None; h/o marijuana; rare use now.      Exercise:  None      Seatbelt:  100%      Guns:  None      Sexual activity:  Total partners = 25; history of crabs; dates males only.  Last STD screening 4 years ago.     Family History  Problem Relation Age of Onset  . Heart disease Maternal Grandmother        9 bypass surgery  . Cancer Maternal Grandmother        mouth  . Sickle cell trait Father   . Seizures Father   . Alcohol abuse Father   . Thyroid disease Mother        as a child  . Hypertension Mother   . Anemia Mother   . Migraines Mother   . Hernia Mother   . Diabetes Mother   . Hypertension Paternal Grandmother   . Diabetes Paternal Grandmother   . Arthritis Maternal Aunt        Objective:    BP 123/82   Pulse 97   Temp 98 F (36.7 C) (Oral)   Resp 16   Ht 5' 1.81" (1.57 m)   Wt 206 lb (93.4 kg)   LMP 05/20/2017 (Approximate)   SpO2 98%   BMI 37.91 kg/m  Physical Exam  Constitutional: She is oriented to person, place, and time. She appears well-developed and well-nourished. No distress.  HENT:  Head: Normocephalic and atraumatic.  Right Ear: External ear normal.  Left Ear: External ear normal.  Nose: Nose normal.  Mouth/Throat: Oropharynx is clear and moist.  Eyes: Pupils are equal, round,  and reactive to light. Conjunctivae and EOM are normal.  Neck: Normal range of motion. Neck supple. Carotid bruit is not present. No thyromegaly present.  Cardiovascular: Normal rate, regular rhythm, normal heart sounds and intact distal pulses.  Exam reveals no gallop and no  friction rub.   No murmur heard. +small varicosity LEFT leg.  Hommen's negative.  Pulmonary/Chest: Effort normal and breath sounds normal. She has no wheezes. She has no rales.  Abdominal: Soft. Bowel sounds are normal. She exhibits no distension and no mass. There is no tenderness. There is no rebound and no guarding.  Musculoskeletal: She exhibits no edema.       Left shoulder: Normal. She exhibits normal range of motion.       Left elbow: Normal. She exhibits normal range of motion and no swelling.       Left wrist: Normal. She exhibits normal range of motion and no tenderness.       Cervical back: Normal. She exhibits normal range of motion and no bony tenderness.       Right upper arm: Normal. She exhibits no tenderness, no bony tenderness, no swelling, no edema, no deformity and no laceration.       Left upper arm: Normal. She exhibits no tenderness, no bony tenderness, no swelling, no edema and no deformity.       Right forearm: Normal. She exhibits no tenderness, no bony tenderness, no swelling and no edema.       Left forearm: Normal. She exhibits no tenderness, no bony tenderness, no swelling and no edema.  Lymphadenopathy:    She has no cervical adenopathy.  Neurological: She is alert and oriented to person, place, and time. No cranial nerve deficit.  Skin: Skin is warm and dry. No rash noted. She is not diaphoretic. No erythema. No pallor.  Psychiatric: She has a normal mood and affect. Her behavior is normal.   Results for orders placed or performed in visit on 01/21/17  CBC with Differential/Platelet  Result Value Ref Range   WBC 7.1 3.4 - 10.8 x10E3/uL   RBC 4.50 3.77 - 5.28 x10E6/uL   Hemoglobin 11.5  11.1 - 15.9 g/dL   Hematocrit 35.9 34.0 - 46.6 %   MCV 80 79 - 97 fL   MCH 25.6 (L) 26.6 - 33.0 pg   MCHC 32.0 31.5 - 35.7 g/dL   RDW 15.6 (H) 12.3 - 15.4 %   Platelets 345 150 - 379 x10E3/uL   Neutrophils 61 Not Estab. %   Lymphs 26 Not Estab. %   Monocytes 8 Not Estab. %   Eos 5 Not Estab. %   Basos 0 Not Estab. %   Neutrophils Absolute 4.4 1.4 - 7.0 x10E3/uL   Lymphocytes Absolute 1.9 0.7 - 3.1 x10E3/uL   Monocytes Absolute 0.5 0.1 - 0.9 x10E3/uL   EOS (ABSOLUTE) 0.3 0.0 - 0.4 x10E3/uL   Basophils Absolute 0.0 0.0 - 0.2 x10E3/uL   Immature Granulocytes 0 Not Estab. %   Immature Grans (Abs) 0.0 0.0 - 0.1 x10E3/uL  Comprehensive metabolic panel  Result Value Ref Range   Glucose 93 65 - 99 mg/dL   BUN 11 6 - 24 mg/dL   Creatinine, Ser 0.73 0.57 - 1.00 mg/dL   GFR calc non Af Amer 102 >59 mL/min/1.73   GFR calc Af Amer 117 >59 mL/min/1.73   BUN/Creatinine Ratio 15 9 - 23   Sodium 142 134 - 144 mmol/L   Potassium 4.1 3.5 - 5.2 mmol/L   Chloride 103 96 - 106 mmol/L   CO2 25 18 - 29 mmol/L   Calcium 9.0 8.7 - 10.2 mg/dL   Total Protein 7.0 6.0 - 8.5 g/dL   Albumin 4.0 3.5 - 5.5 g/dL   Globulin, Total 3.0 1.5 - 4.5  g/dL   Albumin/Globulin Ratio 1.3 1.2 - 2.2   Bilirubin Total 0.7 0.0 - 1.2 mg/dL   Alkaline Phosphatase 54 39 - 117 IU/L   AST 16 0 - 40 IU/L   ALT 18 0 - 32 IU/L  Iron  Result Value Ref Range   Iron 51 27 - 159 ug/dL  TSH  Result Value Ref Range   TSH 1.950 0.450 - 4.500 uIU/mL  Vitamin B12  Result Value Ref Range   Vitamin B-12 815 232 - 1,245 pg/mL  Vitamin B1  Result Value Ref Range   Thiamine 98.8 66.5 - 200.0 nmol/L  Folate RBC  Result Value Ref Range   Folate, Hemolysate 485.9 Not Estab. ng/mL   Folate, RBC 1,353 >498 ng/mL   No results found. Depression screen [facility] 2/9 05/30/2017 05/27/2017 01/21/2017 11/19/2016 10/08/2016  Decreased Interest 0 0 0 0 0  Down, Depressed, Hopeless 0 0 - 0 0  PHQ - 2 Score 0 0 0 0 0   Fall Risk  05/30/2017 05/27/2017  01/21/2017 11/19/2016 10/08/2016  Falls in the past year? No No No No No        Assessment & Plan:   1. Pain and swelling of lower leg, left   2. Left arm swelling   3. Varicose veins of left lower extremity with ulcer ([place])   4. Bronchospasm   5. Class 2 obesity due to excess calories without serious comorbidity with body mass index (BMI) of 37.0 to 37.9 in adult    -new onset reported swelling and pain in LEFT arm and leg; benign exam in office; ED records reviewed in detail during visit. -has undergone very extensive evaluation for symptoms including LE and UE dopplers and CT angio due to elevated D-dimer.  Known varicose veins in LEFT leg which may be etiology to subtle swelling in L leg.  Reassurance provided. -recommend decreasing sodium intake as has increased salt intake recently. -recommend weight loss, exercise for 30-60 minutes five days per week; recommend 1200 kcal restriction per day with a minimum of 60 grams of protein per day. -refills of Albuterol and QVAR provided.  Monitor breathing; may warrant sleep study.    No orders of the defined types were placed in this encounter.  Meds ordered this encounter  Medications  . albuterol (PROVENTIL HFA;VENTOLIN HFA) 108 (90 Base) MCG/ACT inhaler    Sig: Inhale 2 puffs into the lungs every 6 (six) hours as needed for wheezing or shortness of breath.    Dispense:  18 g    Refill:  1  . beclomethasone (QVAR) 40 MCG/ACT inhaler    Sig: INHALE TWO PUFFS INTO THE LUNGS TWO TIMES DAILY    Dispense:  3 Inhaler    Refill:  3    Please consider 90 day supplies to promote better adherence    No Follow-up on file.   Laura-Lee Villegas Elayne Guerin, M.D. Primary Care at [facility] [place] [address] [place], [place]  [address] [phone] phone [phone] fax

## 2017-05-28 ENCOUNTER — Encounter: Payer: Self-pay | Admitting: Family Medicine

## 2017-05-30 ENCOUNTER — Encounter: Payer: Self-pay | Admitting: Physician Assistant

## 2017-05-30 ENCOUNTER — Ambulatory Visit (INDEPENDENT_AMBULATORY_CARE_PROVIDER_SITE_OTHER): Payer: Federal, State, Local not specified - PPO | Admitting: Physician Assistant

## 2017-05-30 VITALS — BP 117/81 | HR 101 | Temp 97.8°F | Resp 18 | Ht 61.42 in | Wt 202.6 lb

## 2017-05-30 DIAGNOSIS — J309 Allergic rhinitis, unspecified: Secondary | ICD-10-CM

## 2017-05-30 DIAGNOSIS — H5789 Other specified disorders of eye and adnexa: Secondary | ICD-10-CM

## 2017-05-30 DIAGNOSIS — Z711 Person with feared health complaint in whom no diagnosis is made: Secondary | ICD-10-CM

## 2017-05-30 DIAGNOSIS — H578 Other specified disorders of eye and adnexa: Secondary | ICD-10-CM

## 2017-05-30 DIAGNOSIS — R609 Edema, unspecified: Secondary | ICD-10-CM | POA: Diagnosis not present

## 2017-05-30 LAB — POCT URINALYSIS DIP (MANUAL ENTRY)
BILIRUBIN UA: NEGATIVE mg/dL
Bilirubin, UA: NEGATIVE
Glucose, UA: NEGATIVE mg/dL
Leukocytes, UA: NEGATIVE
Nitrite, UA: NEGATIVE
PH UA: 7 (ref 5.0–8.0)
PROTEIN UA: NEGATIVE mg/dL
RBC UA: NEGATIVE
SPEC GRAV UA: 1.01 (ref 1.010–1.025)
Urobilinogen, UA: 0.2 E.U./dL

## 2017-05-30 MED ORDER — FLUTICASONE PROPIONATE 50 MCG/ACT NA SUSP: 2.0000 | Freq: Every day | NASAL | 0 refills | Status: DC

## 2017-05-30 NOTE — Patient Instructions (Addendum)
For nasal congestion, I recommend using flonase daily. I have sent in a prescription. I would also suggest using a cold compress under the eyes for swelling. Continue daily Claritin. Follow up with Dr. Tamala Julian as planned in 2 weeks. Thank you for letting me participate in your health and well being.    IF you received an x-ray today, you will receive an invoice from [facility]. Please contact [facility] at [phone] with questions or concerns regarding your invoice.   IF you received labwork today, you will receive an invoice from [facility]. Please contact [facility] at [phone] with questions or concerns regarding your invoice.   Our billing staff will not be able to assist you with questions regarding bills from these companies.  You will be contacted with the lab results as soon as they are available. The fastest way to get your results is to activate your My Chart account. Instructions are located on the last page of this paperwork. If you have not heard from [place] regarding the results in 2 weeks, please contact this office.

## 2017-05-30 NOTE — Progress Notes (Signed)
Sherri Buckley  MRN: [id number] DOB: Nov 28, 1975  Subjective:  Sherri Buckley is a 41 y.o. female seen in office today for a chief complaint of right side jaw swelling 1 day. She woke up this morning and felt like the right side of her jaw was swellon. Had associated tingling/pressure sensation in the right ear, which resolved. Denies jaw pain, ear pain, tinnitus, decreased hearing, facial pain, rash, blurred vision, sore throat, nasal congestion, fever, chills, speech disturbance, and facial numbness. Patient has not tried anything for relief. Has hx of seasonal allergies, stopped using nasal spray a few days ago.   Of note, pt has recently felt like different parts of her body have been swelling recently. She saw Dr. Tamala Julian on 05/27/17 for follow up from ED visit for left left and left arm swelling. Per Dr. Thompson Caul OV note, left lower extremitydoppler was negative, left arm Doppler was negative, CTA chest was negative. Patient did endorse eating more salt at that visit but today reports she stopped using salt 2 days ago. She is drinking at least 64 oz for the last two days.   Pt mentions she has had 4 MRIs of the head, all normal. Thinks she has a brain anuerysm and is concerned that this swelling can be because of that.    Review of Systems  Constitutional: Negative for chills, diaphoresis and fever.  HENT: Negative for ear discharge, hearing loss, rhinorrhea, sinus pain, tinnitus, trouble swallowing and voice change.   Eyes: Negative for pain.  Cardiovascular: Negative for chest pain and palpitations.  Gastrointestinal: Negative for nausea and vomiting.  Neurological: Negative for dizziness, weakness and light-headedness.  Psychiatric/Behavioral: Negative for confusion.    Patient Active Problem List   Diagnosis Date Noted  . Anxiety about health 02/17/2017  . Asthma 02/21/2016  . BMI 36.0-36.9,adult 02/21/2016  . Palpitation 12/22/2014  . Elevated troponin 12/21/2014  .  Allergic rhinitis 03/06/2014  . Nipple discharge 06/21/2013  . Hypovitaminosis D 02/09/2013  . Alcohol abuse 02/09/2013    Current Outpatient Prescriptions on File Prior to Visit  Medication Sig Dispense Refill  . albuterol (PROVENTIL HFA;VENTOLIN HFA) 108 (90 Base) MCG/ACT inhaler Inhale 2 puffs into the lungs every 6 (six) hours as needed for wheezing or shortness of breath. 18 g 1  . beclomethasone (QVAR) 40 MCG/ACT inhaler INHALE TWO PUFFS INTO THE LUNGS TWO TIMES DAILY 3 Inhaler 3  . Multiple Vitamin (MULTIVITAMIN WITH MINERALS) TABS Take 1 tablet by mouth daily.     Current Facility-Administered Medications on File Prior to Visit  Medication Dose Route Frequency Provider Last Rate Last Dose  . albuterol (PROVENTIL) (2.5 MG/3ML) 0.083% nebulizer solution 2.5 mg  2.5 mg Nebulization Once Wardell Honour, MD        No Known Allergies    Social History   Social History  . Marital status: Single    Spouse name: Sherri Buckley  . Number of children: Sherri Buckley  . Years of education: Sherri Buckley   Occupational History  .  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 Smoking status: Current Some Day Smoker  . Smokeless tobacco: Never Used     Comment: blacky milds  . Alcohol use 0.0 oz/week     Comment: 12/21/2014 "quit drinking in 2013"  . Drug use: Yes    Types: Marijuana     Comment: "quit smoking marijuana in 2013"  . Sexual activity: No   Other Topics Concern  .  Not on file   Social History Narrative   Marital status: single; not dating      Children:  None      Lives: alone in house.      Employment: Actor x 11 years; clerk processing; happy; 3rd shift.       Tobacco:  Cigars once or twice per week.      Alcohol: drinks beer 5 per weekl; previous alcoholism; stopped drinking heavily 2014; previous counseling; went through EAP with work.      Drugs:  None; h/o marijuana; rare use now.      Exercise:  None      Seatbelt:  100%      Guns:  None       Sexual activity:  Total partners = 25; history of crabs; dates males only.  Last STD screening 4 years ago.      Objective:  BP 117/81 (BP Location: Right Arm, Patient Position: Sitting, Cuff Size: Large)   Pulse (!) 101   Temp 97.8 F (36.6 C) (Oral)   Resp 18   Ht 5' 1.42" (1.56 m)   Wt 202 lb 9.6 oz (91.9 kg)   LMP 05/20/2017 (Approximate)   SpO2 98%   BMI 37.76 kg/m   Physical Exam  Constitutional: She is oriented to person, place, and time and well-developed, well-nourished, and in no distress.  HENT:  Head: Normocephalic and atraumatic.  Right Ear: Tympanic membrane, external ear and ear canal normal.  Left Ear: Tympanic membrane, external ear and ear canal normal.  Nose: Mucosal edema (prominent on right side) present. Right sinus exhibits no maxillary sinus tenderness and no frontal sinus tenderness. Left sinus exhibits no maxillary sinus tenderness and no frontal sinus tenderness.  Mouth/Throat: Uvula is midline, oropharynx is clear and moist and mucous membranes are normal.  Minimal swelling of right inferior periorbital region. No periorbital erythema or warmth noted. No pain with palpation. No apparent swelling of right jaw visualized. Facial sensation intact.  Eyes: Conjunctivae and EOM are normal.  Neck: Normal range of motion.  Cardiovascular: Normal rate, regular rhythm and normal heart sounds.   Pulmonary/Chest: Effort normal and breath sounds normal.  Musculoskeletal:       Right lower leg: She exhibits no tenderness and no swelling.       Left lower leg: She exhibits no tenderness and no swelling.  Lymphadenopathy:       Head (right side): No submental, no submandibular, no tonsillar, no preauricular, no posterior auricular and no occipital adenopathy present.       Head (left side): No submental, no submandibular, no tonsillar, no preauricular, no posterior auricular and no occipital adenopathy present.    She has no cervical adenopathy.       Right: No  supraclavicular adenopathy present.       Left: No supraclavicular adenopathy present.  Neurological: She is alert and oriented to person, place, and time. She has normal sensation, normal strength and intact cranial nerves. She has a normal Finger-Nose-Finger Test and a normal Heel to [employer]. Gait normal.  Reflex Scores:      Tricep reflexes are 2+ on the right side and 2+ on the left side.      Bicep reflexes are 2+ on the right side and 2+ on the left side.      Brachioradialis reflexes are 2+ on the right side and 2+ on the left side.      Patellar reflexes are 2+ on the right side  and 2+ on the left side.      Achilles reflexes are 2+ on the right side and 2+ on the left side. Asymetrical smile, baseline for patient.   Skin: Skin is warm and dry.  Psychiatric: Affect normal.  Vitals reviewed.  Results for orders placed or performed in visit on 05/30/17 (from the past 24 hour(s))  POCT urinalysis dipstick     Status: None   Collection Time: 05/30/17  5:52 PM  Result Value Ref Range   Color, UA yellow yellow   Clarity, UA clear clear   Glucose, UA negative negative mg/dL   Bilirubin, UA negative negative   Ketones, POC UA negative negative mg/dL   Spec Grav, UA 1.010 1.010 - 1.025   Blood, UA negative negative   pH, UA 7.0 5.0 - 8.0   Protein Ur, POC negative negative mg/dL   Urobilinogen, UA 0.2 0.2 or 1.0 E.U./dL   Nitrite, UA Negative Negative   Leukocytes, UA Negative Negative    Assessment and Plan :  This case was precepted with Dr. Tamala Julian, who also examined the patient.  1. Swelling of right eye No acute findings on exam. Swelling of jaw not visualized. Patient does have mucosal edema, which is likely due to untreated allergies. Have given a refill for Flonase. Recommend she use that daily, along with oral antihistamine. Also encouraged to use cold compress to the eyes. Continue limiting salt intake. Plan to follow with Dr. Tamala Julian in 2 weeks. Return to clinic if  symptoms worsen, do not improve, or as needed - POCT urinalysis dipstick 2. Mucosal edema - fluticasone (FLONASE) 50 MCG/ACT nasal spray; Place 2 sprays into both nostrils daily.  Dispense: 16 g; Refill: 0 3. Allergic rhinitis, unspecified seasonality, unspecified trigger 4. Feared condition not demonstrated  A total of 25 minutes was spent in the room with the patient, greater than 50% of which was in counseling/coordination of care regarding facial swelling.  Tenna Delaine PA-C  [facility] 05/30/2017 5:12 PM

## 2017-06-01 DIAGNOSIS — Z79899 Other long term (current) drug therapy: Secondary | ICD-10-CM | POA: Diagnosis not present

## 2017-06-01 DIAGNOSIS — R109 Unspecified abdominal pain: Secondary | ICD-10-CM | POA: Diagnosis not present

## 2017-06-01 DIAGNOSIS — M7989 Other specified soft tissue disorders: Secondary | ICD-10-CM | POA: Diagnosis not present

## 2017-06-01 DIAGNOSIS — R140 Abdominal distension (gaseous): Secondary | ICD-10-CM | POA: Diagnosis not present

## 2017-06-01 DIAGNOSIS — R609 Edema, unspecified: Secondary | ICD-10-CM | POA: Diagnosis not present

## 2017-06-01 DIAGNOSIS — R1084 Generalized abdominal pain: Secondary | ICD-10-CM | POA: Diagnosis not present

## 2017-06-01 DIAGNOSIS — R000 Tachycardia, unspecified: Secondary | ICD-10-CM | POA: Diagnosis not present

## 2017-06-01 DIAGNOSIS — Z7951 Long term (current) use of inhaled steroids: Secondary | ICD-10-CM | POA: Diagnosis not present

## 2017-06-01 DIAGNOSIS — I10 Essential (primary) hypertension: Secondary | ICD-10-CM | POA: Diagnosis not present

## 2017-06-03 ENCOUNTER — Ambulatory Visit: Payer: Federal, State, Local not specified - PPO | Admitting: Family Medicine

## 2017-06-07 ENCOUNTER — Ambulatory Visit: Payer: Federal, State, Local not specified - PPO | Admitting: Family Medicine

## 2017-06-09 NOTE — Progress Notes (Deleted)
Subjective:    Patient ID: Sherri Buckley, female    DOB: 02/13/1976, 41 y.o.   MRN: [id number]  06/10/2017  No chief complaint on file.   HPI This 41 y.o. female presents for evaluation of ***. BP Readings from Last 3 Encounters:  05/30/17 117/81  05/27/17 123/82  01/25/17 130/76   Wt Readings from Last 3 Encounters:  05/30/17 202 lb 9.6 oz (91.9 kg)  05/27/17 206 lb (93.4 kg)  01/21/17 189 lb 9.6 oz (86 kg)   Immunization History  Administered Date(s) Administered  . Tdap 02/13/2015    Review of Systems  Constitutional: Negative for chills, diaphoresis, fatigue and fever.  Eyes: Negative for visual disturbance.  Respiratory: Negative for cough and shortness of breath.   Cardiovascular: Negative for chest pain, palpitations and leg swelling.  Gastrointestinal: Negative for abdominal pain, constipation, diarrhea, nausea and vomiting.  Endocrine: Negative for cold intolerance, heat intolerance, polydipsia, polyphagia and polyuria.  Neurological: Negative for dizziness, tremors, seizures, syncope, facial asymmetry, speech difficulty, weakness, light-headedness, numbness and headaches.    Past Medical History:  Diagnosis Date  . Allergy   . Asthma   . Blood type, Rh negative   . Chronic bronchitis ([place])    "get it q yr"  . Depression 10/07/1998  . ZYSAYTKZ(601.0)    "monthly" (12/21/2014)  . Osteoarthritis of ankle, right   . Seasonal allergies   . Substance abuse    Past Surgical History:  Procedure Laterality Date  . BREAST BIOPSY Right ~ 1993   "took out some benign tissue"   No Known Allergies Current Outpatient Prescriptions  Medication Sig Dispense Refill  . albuterol (PROVENTIL HFA;VENTOLIN HFA) 108 (90 Base) MCG/ACT inhaler Inhale 2 puffs into the lungs every 6 (six) hours as needed for wheezing or shortness of breath. 18 g 1  . beclomethasone (QVAR) 40 MCG/ACT inhaler INHALE TWO PUFFS INTO THE LUNGS TWO TIMES DAILY 3 Inhaler 3  . fluticasone (FLONASE)  50 MCG/ACT nasal spray Place 2 sprays into both nostrils daily. 16 g 0  . Multiple Vitamin (MULTIVITAMIN WITH MINERALS) TABS Take 1 tablet by mouth daily.     Current Facility-Administered Medications  Medication Dose Route Frequency Provider Last Rate Last Dose  . albuterol (PROVENTIL) (2.5 MG/3ML) 0.083% nebulizer solution 2.5 mg  2.5 mg Nebulization Once Wardell Honour, MD       Social History   Social History  . Marital status: Single    Spouse name: N/A  . Number of children: N/A  . Years of education: N/A   Occupational History  .  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 Smoking status: Current Some Day Smoker  . Smokeless tobacco: Never Used     Comment: blacky milds  . Alcohol use 0.0 oz/week     Comment: 12/21/2014 "quit drinking in 2013"  . Drug use: Yes    Types: Marijuana     Comment: "quit smoking marijuana in 2013"  . Sexual activity: No   Other Topics Concern  . Not on file   Social History Narrative   Marital status: single; not dating      Children:  None      Lives: alone in house.      Employment: Actor x 11 years; clerk processing; happy; 3rd shift.       Tobacco:  Cigars once or twice per week.      Alcohol: drinks beer 5 per  weekl; previous alcoholism; stopped drinking heavily 2014; previous counseling; went through EAP with work.      Drugs:  None; h/o marijuana; rare use now.      Exercise:  None      Seatbelt:  100%      Guns:  None      Sexual activity:  Total partners = 25; history of crabs; dates males only.  Last STD screening 4 years ago.     Family History  Problem Relation Age of Onset  . Heart disease Maternal Grandmother        9 bypass surgery  . Cancer Maternal Grandmother        mouth  . Sickle cell trait Father   . Seizures Father   . Alcohol abuse Father   . Thyroid disease Mother        as a child  . Hypertension Mother   . Anemia Mother   . Migraines Mother   . Hernia Mother     . Diabetes Mother   . Hypertension Paternal Grandmother   . Diabetes Paternal Grandmother   . Arthritis Maternal Aunt        Objective:    LMP 05/20/2017 (Approximate)  Physical Exam  Constitutional: She is oriented to person, place, and time. She appears well-developed and well-nourished. No distress.  HENT:  Head: Normocephalic and atraumatic.  Right Ear: External ear normal.  Left Ear: External ear normal.  Nose: Nose normal.  Mouth/Throat: Oropharynx is clear and moist.  Eyes: Pupils are equal, round, and reactive to light. Conjunctivae and EOM are normal.  Neck: Normal range of motion. Neck supple. Carotid bruit is not present. No thyromegaly present.  Cardiovascular: Normal rate, regular rhythm, normal heart sounds and intact distal pulses.  Exam reveals no gallop and no friction rub.   No murmur heard. Pulmonary/Chest: Effort normal and breath sounds normal. She has no wheezes. She has no rales.  Abdominal: Soft. Bowel sounds are normal. She exhibits no distension and no mass. There is no tenderness. There is no rebound and no guarding.  Lymphadenopathy:    She has no cervical adenopathy.  Neurological: She is alert and oriented to person, place, and time. No cranial nerve deficit.  Skin: Skin is warm and dry. No rash noted. She is not diaphoretic. No erythema. No pallor.  Psychiatric: She has a normal mood and affect. Her behavior is normal.   Results for orders placed or performed in visit on 05/30/17  POCT urinalysis dipstick  Result Value Ref Range   Color, UA yellow yellow   Clarity, UA clear clear   Glucose, UA negative negative mg/dL   Bilirubin, UA negative negative   Ketones, POC UA negative negative mg/dL   Spec Grav, UA 1.010 1.010 - 1.025   Blood, UA negative negative   pH, UA 7.0 5.0 - 8.0   Protein Ur, POC negative negative mg/dL   Urobilinogen, UA 0.2 0.2 or 1.0 E.U./dL   Nitrite, UA Negative Negative   Leukocytes, UA Negative Negative   No  results found. Depression screen [facility] 2/9 05/30/2017 05/27/2017 01/21/2017 11/19/2016 10/08/2016  Decreased Interest 0 0 0 0 0  Down, Depressed, Hopeless 0 0 - 0 0  PHQ - 2 Score 0 0 0 0 0   Fall Risk  05/30/2017 05/27/2017 01/21/2017 11/19/2016 10/08/2016  Falls in the past year? No No No No No        Assessment & Plan:  No diagnosis found.  No orders of  the defined types were placed in this encounter.  No orders of the defined types were placed in this encounter.   No Follow-up on file.   Thurma Priego Elayne Guerin, M.D. [facility] [address] [place], [place]  [address] [phone] phone [phone] fax

## 2017-06-10 ENCOUNTER — Ambulatory Visit: Payer: Federal, State, Local not specified - PPO | Admitting: Family Medicine

## 2017-06-11 ENCOUNTER — Ambulatory Visit (INDEPENDENT_AMBULATORY_CARE_PROVIDER_SITE_OTHER): Payer: Federal, State, Local not specified - PPO | Admitting: Family Medicine

## 2017-06-11 ENCOUNTER — Encounter: Payer: Self-pay | Admitting: Family Medicine

## 2017-06-11 VITALS — BP 119/79 | HR 93 | Temp 98.4°F | Resp 18 | Ht 61.0 in | Wt 196.6 lb

## 2017-06-11 DIAGNOSIS — Z13220 Encounter for screening for lipoid disorders: Secondary | ICD-10-CM | POA: Diagnosis not present

## 2017-06-11 DIAGNOSIS — Z131 Encounter for screening for diabetes mellitus: Secondary | ICD-10-CM | POA: Diagnosis not present

## 2017-06-11 DIAGNOSIS — Z114 Encounter for screening for human immunodeficiency virus [HIV]: Secondary | ICD-10-CM

## 2017-06-11 DIAGNOSIS — Z0000 Encounter for general adult medical examination without abnormal findings: Secondary | ICD-10-CM

## 2017-06-11 DIAGNOSIS — M7989 Other specified soft tissue disorders: Secondary | ICD-10-CM

## 2017-06-11 NOTE — Patient Instructions (Addendum)
 Preventive Care 18-39 Years, Female Preventive care refers to lifestyle choices and visits with your health care provider that can promote health and wellness. What does preventive care include?  A yearly physical exam. This is also called an annual well check.  Dental exams once or twice a year.  Routine eye exams. Ask your health care provider how often you should have your eyes checked.  Personal lifestyle choices, including: ? Daily care of your teeth and gums. ? Regular physical activity. ? Eating a healthy diet. ? Avoiding tobacco and drug use. ? Limiting alcohol use. ? Practicing safe sex. ? Taking vitamin and mineral supplements as recommended by your health care provider. What happens during an annual well check? The services and screenings done by your health care provider during your annual well check will depend on your age, overall health, lifestyle risk factors, and family history of disease. Counseling Your health care provider may ask you questions about your:  Alcohol use.  Tobacco use.  Drug use.  Emotional well-being.  Home and relationship well-being.  Sexual activity.  Eating habits.  Work and work environment.  Method of birth control.  Menstrual cycle.  Pregnancy history.  Screening You may have the following tests or measurements:  Height, weight, and BMI.  Diabetes screening. This is done by checking your blood sugar (glucose) after you have not eaten for a while (fasting).  Blood pressure.  Lipid and cholesterol levels. These may be checked every 5 years starting at age 20.  Skin check.  Hepatitis C blood test.  Hepatitis B blood test.  Sexually transmitted disease (STD) testing.  BRCA-related cancer screening. This may be done if you have a family history of breast, ovarian, tubal, or peritoneal cancers.  Pelvic exam and Pap test. This may be done every 3 years starting at age 21. Starting at age 30, this may be done  every 5 years if you have a Pap test in combination with an HPV test.  Discuss your test results, treatment options, and if necessary, the need for more tests with your health care provider. Vaccines Your health care provider may recommend certain vaccines, such as:  Influenza vaccine. This is recommended every year.  Tetanus, diphtheria, and acellular pertussis (Tdap, Td) vaccine. You may need a Td booster every 10 years.  Varicella vaccine. You may need this if you have not been vaccinated.  HPV vaccine. If you are 26 or younger, you may need three doses over 6 months.  Measles, mumps, and rubella (MMR) vaccine. You may need at least one dose of MMR. You may also need a second dose.  Pneumococcal 13-valent conjugate (PCV13) vaccine. You may need this if you have certain conditions and were not previously vaccinated.  Pneumococcal polysaccharide (PPSV23) vaccine. You may need one or two doses if you smoke cigarettes or if you have certain conditions.  Meningococcal vaccine. One dose is recommended if you are age 19-21 years and a first-year college student living in a residence hall, or if you have one of several medical conditions. You may also need additional booster doses.  Hepatitis A vaccine. You may need this if you have certain conditions or if you travel or work in places where you may be exposed to hepatitis A.  Hepatitis B vaccine. You may need this if you have certain conditions or if you travel or work in places where you may be exposed to hepatitis B.  Haemophilus influenzae type b (Hib) vaccine. You may need this   you have certain risk factors.  Talk to your health care provider about which screenings and vaccines you need and how often you need them. This information is not intended to replace advice given to you by your health care provider. Make sure you discuss any questions you have with your health care provider. Document Released: 11/19/2001 Document Revised:  06/12/2016 Document Reviewed: 07/25/2015 Elsevier Interactive Patient Education  2017 [place]   Dentist: [facility] in [place], Dr. Barnie Alderman in [place].   IF you received an x-ray today, you will receive an invoice from [facility]. Please contact [facility] at [phone] with questions or concerns regarding your invoice.   IF you received labwork today, you will receive an invoice from [facility]. Please contact [facility] at [phone] with questions or concerns regarding your invoice.   Our billing staff will not be able to assist you with questions regarding bills from these companies.  You will be contacted with the lab results as soon as they are available. The fastest way to get your results is to activate your My Chart account. Instructions are located on the last page of this paperwork. If you have not heard from [place] regarding the results in 2 weeks, please contact this office.

## 2017-06-11 NOTE — Progress Notes (Signed)
Subjective:    Patient ID: Sherri Buckley, female    DOB: 15-May-1976, 41 y.o.   MRN: [id number]  06/11/2017  Annual Exam (No PAP needed.)   HPI This 41 y.o. female presents for Complete Physical Examination.  Last physical: Pap smear:  2017; Dr. Charlesetta Garibaldi Mammogram:   12/2016; 03/2017; s/p three biopsies.  [facility].  To undergo MRI in September 2018. Eye exam:  none Dental exam:  None recently; needs to see a dentist.   BP Readings from Last 3 Encounters:  06/13/17 111/76  06/11/17 119/79  05/30/17 117/81   Wt Readings from Last 3 Encounters:  06/13/17 196 lb (88.9 kg)  06/11/17 196 lb 9.6 oz (89.2 kg)  05/30/17 202 lb 9.6 oz (91.9 kg)   Immunization History  Administered Date(s) Administered  . Tdap 02/13/2015    Visual Acuity Screening   Right eye Left eye Both eyes  Without correction: 20/20 20/20 20/20   With correction:       Weight is down; has been doing three day smoothie diet.  Has been working on weight loss.  Slowly add food back in.  Continues to drink one smoothie per day.  Not that hungry.  Finished cleanse last week; no alcohol since stopping.  No formal exercise; plans to start with yoga.  [place]. [employer].   Drinking a bottle of wine and limearhitas on off days; no drinking five days of the week.  Heavy drinker for 15 years.   Review of Systems  Constitutional: Negative for activity change, appetite change, chills, diaphoresis, fatigue, fever and unexpected weight change.  HENT: Negative for congestion, dental problem, drooling, ear discharge, ear pain, facial swelling, hearing loss, mouth sores, nosebleeds, postnasal drip, rhinorrhea, sinus pressure, sneezing, sore throat, tinnitus, trouble swallowing and voice change.   Eyes: Negative for photophobia, pain, discharge, redness, itching and visual disturbance.  Respiratory: Negative for apnea, cough, choking, chest tightness, shortness of breath, wheezing and stridor.     Cardiovascular: Positive for palpitations and leg swelling. Negative for chest pain.  Gastrointestinal: Negative for abdominal distention, abdominal pain, anal bleeding, blood in stool, constipation, diarrhea, nausea, rectal pain and vomiting.  Endocrine: Negative for cold intolerance, heat intolerance, polydipsia, polyphagia and polyuria.  Genitourinary: Negative for decreased urine volume, difficulty urinating, dyspareunia, dysuria, enuresis, flank pain, frequency, genital sores, hematuria, menstrual problem, pelvic pain, urgency, vaginal bleeding, vaginal discharge and vaginal pain.  Musculoskeletal: Negative for arthralgias, back pain, gait problem, joint swelling, myalgias, neck pain and neck stiffness.  Skin: Negative for color change, pallor, rash and wound.  Allergic/Immunologic: Negative for environmental allergies, food allergies and immunocompromised state.  Neurological: Positive for numbness. Negative for dizziness, tremors, seizures, syncope, facial asymmetry, speech difficulty, weakness, light-headedness and headaches.  Hematological: Negative for adenopathy. Does not bruise/bleed easily.  Psychiatric/Behavioral: Negative for agitation, behavioral problems, confusion, decreased concentration, dysphoric mood, hallucinations, self-injury, sleep disturbance and suicidal ideas. The patient is not nervous/anxious and is not hyperactive.     Past Medical History:  Diagnosis Date  . Allergy   . Asthma   . Blood type, Rh negative   . Chronic bronchitis ([place])    "get it q yr"  . Depression 10/07/1998  . FBPZWCHE(527.7)    "monthly" (12/21/2014)  . Osteoarthritis of ankle, right   . Seasonal allergies   . Substance abuse    Past Surgical History:  Procedure Laterality Date  . BREAST BIOPSY Right ~ 1993   "took out some benign tissue"  No Known Allergies  Social History   Social History  . Marital status: Single    Spouse name: N/A  . Number of children: 0  . Years of  education: N/A   Occupational History  . 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 Smoking status: Former Research scientist (life sciences)  . Smokeless tobacco: Never Used     Comment: blacky milds  . Alcohol use 0.0 oz/week     Comment: 12/21/2014 "quit drinking in 2013"  . Drug use: Yes    Types: Marijuana     Comment: "quit smoking marijuana in 2013"  . Sexual activity: No   Other Topics Concern  . Not on file   Social History Narrative   Marital status: single; not dating      Children:  None      Lives: alone in house.      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        Tobacco:  Cigars rarely in 2018.      Alcohol: drinks beer 5 per week;  previous alcoholism; stopped drinking heavily 2014; previous counseling; went through EAP with work.      Drugs:  None; h/o marijuana; rare use now.      Exercise:  None      Seatbelt:  100%      Guns:  None      Sexual activity:  Total partners = 25; history of crabs; dates males only.  Last STD screening 4 years ago.     Family History  Problem Relation Age of Onset  . Heart disease Maternal Grandmother        9 bypass surgery  . Cancer Maternal Grandmother        mouth  . Sickle cell trait Father   . Seizures Father   . Alcohol abuse Father   . Thyroid disease Mother        as a child  . Hypertension Mother   . Anemia Mother   . Migraines Mother   . Hernia Mother   . Diabetes Mother   . Hypertension Paternal Grandmother   . Diabetes Paternal Grandmother   . Arthritis Maternal Aunt        Objective:    BP 119/79 (BP Location: Right Arm, Patient Position: Sitting, Cuff Size: Large)   Pulse 93   Temp 98.4 F (36.9 C) (Oral)   Resp 18   Ht 5\' 1"  (1.549 m)   Wt 196 lb 9.6 oz (89.2 kg)   LMP 05/20/2017 (Approximate)   SpO2 98%   BMI 37.15 kg/m  Physical Exam  Constitutional: She is oriented to person, place, and time. She appears  well-developed and well-nourished. No distress.  HENT:  Head: Normocephalic and atraumatic.  Right Ear: External ear normal.  Left Ear: External ear normal.  Nose: Nose normal.  Mouth/Throat: Oropharynx is clear and moist.  Eyes: Pupils are equal, round, and reactive to light. Conjunctivae and EOM are normal.  Neck: Normal range of motion and full passive range of motion without pain. Neck supple. No JVD present. Carotid bruit is not present. No thyromegaly present.  Cardiovascular: Normal rate, regular rhythm and normal heart sounds.  Exam reveals no gallop and no friction rub.   No murmur heard. Pulmonary/Chest: Effort normal and breath sounds normal. She has no wheezes. She has no rales.  Abdominal: Soft. Bowel sounds  are normal. She exhibits no distension and no mass. There is no tenderness. There is no rebound and no guarding.  Musculoskeletal:       Right shoulder: Normal.       Left shoulder: Normal.       Right knee: Normal.       Left knee: Normal.       Right ankle: Normal.       Left ankle: Normal.       Cervical back: Normal.       Lumbar back: Normal.       Right lower leg: Normal.       Left lower leg: Normal.  Lymphadenopathy:    She has no cervical adenopathy.  Neurological: She is alert and oriented to person, place, and time. She has normal reflexes. No cranial nerve deficit. She exhibits normal muscle tone. Coordination normal.  Skin: Skin is warm and dry. No rash noted. She is not diaphoretic. No erythema. No pallor.  Psychiatric: She has a normal mood and affect. Her behavior is normal. Judgment and thought content normal.  Nursing note and vitals reviewed.   No results found. Depression screen [facility] 2/9 06/13/2017 06/11/2017 05/30/2017 05/27/2017 01/21/2017  Decreased Interest 0 0 0 0 0  Down, Depressed, Hopeless 0 0 0 0 -  PHQ - 2 Score 0 0 0 0 0   Fall Risk  06/13/2017 06/11/2017 05/30/2017 05/27/2017 01/21/2017  Falls in the past year? No No No No No          Assessment & Plan:   1. Routine physical examination   2. Screening for diabetes mellitus   3. Screening, lipid   4. Screening for HIV (human immunodeficiency virus)   5. Swelling of limb    -anticipatory guidance provided --- exercise, weight loss, safe driving practices, aspirin 81mg  daily. -obtain age appropriate screening labs and labs for chronic disease management. -s/p extensive work up for swelling in LLE and LUE; negative work up.  Benign exam in office today.  Recommend ongoing attempts at weight loss, low-sodium diet,and exercise. Recommend compression stockings at work due to varicose veins. -recommend close follow-up due to patient's concern with ongoing symptoms.      Orders Placed This Encounter  Procedures  . Urinalysis, Routine w reflex microscopic  . Hemoglobin A1c  . Lipid panel    Order Specific Question:   Has the patient fasted?    Answer:   Yes  . Comprehensive metabolic panel  . Comprehensive metabolic panel   No orders of the defined types were placed in this encounter.   Return in about 4 weeks (around 07/09/2017) for recheck swelling.   Zayne Marovich Elayne Guerin, M.D. Primary Care at [facility] [address] [place], [place]  [address] [phone] phone [phone] fax

## 2017-06-12 LAB — COMPREHENSIVE METABOLIC PANEL
ALBUMIN: 4.2 g/dL (ref 3.5–5.5)
ALK PHOS: 49 IU/L (ref 39–117)
ALT: 17 IU/L (ref 0–32)
AST: 17 IU/L (ref 0–40)
Albumin/Globulin Ratio: 1.3 (ref 1.2–2.2)
BUN / CREAT RATIO: 18 (ref 9–23)
BUN: 13 mg/dL (ref 6–24)
Bilirubin Total: 0.6 mg/dL (ref 0.0–1.2)
CHLORIDE: 105 mmol/L (ref 96–106)
CO2: 20 mmol/L (ref 20–29)
Calcium: 9.2 mg/dL (ref 8.7–10.2)
Creatinine, Ser: 0.71 mg/dL (ref 0.57–1.00)
GFR calc Af Amer: 121 mL/min/{1.73_m2} (ref >59)
GFR calc non Af Amer: 105 mL/min/{1.73_m2} (ref >59)
GLOBULIN, TOTAL: 3.2 g/dL (ref 1.5–4.5)
Glucose: 94 mg/dL (ref 65–99)
Potassium: 4.2 mmol/L (ref 3.5–5.2)
SODIUM: 140 mmol/L (ref 134–144)
Total Protein: 7.4 g/dL (ref 6.0–8.5)

## 2017-06-12 LAB — LIPID PANEL
CHOLESTEROL TOTAL: 144 mg/dL (ref 100–199)
Chol/HDL Ratio: 3.3 ratio (ref 0.0–4.4)
HDL: 44 mg/dL (ref >39)
LDL Calculated: 87 mg/dL (ref 0–99)
TRIGLYCERIDES: 63 mg/dL (ref 0–149)
VLDL CHOLESTEROL CAL: 13 mg/dL (ref 5–40)

## 2017-06-12 LAB — URINALYSIS, ROUTINE W REFLEX MICROSCOPIC
Bilirubin, UA: NEGATIVE
Glucose, UA: NEGATIVE
Leukocytes, UA: NEGATIVE
NITRITE UA: NEGATIVE
RBC, UA: NEGATIVE
Specific Gravity, UA: >=1.03 — AB (ref 1.005–1.030)
Urobilinogen, Ur: 0.2 mg/dL (ref 0.2–1.0)
pH, UA: 5 (ref 5.0–7.5)

## 2017-06-12 LAB — HEMOGLOBIN A1C
ESTIMATED AVERAGE GLUCOSE: 91 mg/dL
Hgb A1c MFr Bld: 4.8 % (ref 4.8–5.6)

## 2017-06-13 ENCOUNTER — Telehealth: Payer: Self-pay | Admitting: Family Medicine

## 2017-06-13 ENCOUNTER — Ambulatory Visit (INDEPENDENT_AMBULATORY_CARE_PROVIDER_SITE_OTHER): Payer: Federal, State, Local not specified - PPO | Admitting: Physician Assistant

## 2017-06-13 ENCOUNTER — Encounter: Payer: Self-pay | Admitting: Physician Assistant

## 2017-06-13 VITALS — BP 111/76 | HR 86 | Temp 98.2°F | Resp 16 | Ht 61.0 in | Wt 196.0 lb

## 2017-06-13 DIAGNOSIS — M545 Low back pain, unspecified: Secondary | ICD-10-CM

## 2017-06-13 LAB — POCT URINALYSIS DIP (MANUAL ENTRY)
GLUCOSE UA: NEGATIVE mg/dL
LEUKOCYTES UA: NEGATIVE
Nitrite, UA: NEGATIVE
Spec Grav, UA: 1.025 (ref 1.010–1.025)
Urobilinogen, UA: 0.2 E.U./dL
pH, UA: 6 (ref 5.0–8.0)

## 2017-06-13 MED ORDER — MELOXICAM 15 MG PO TABS: 15.0000 mg | ORAL_TABLET | Freq: Every day | ORAL | 0 refills | Status: DC

## 2017-06-13 NOTE — Telephone Encounter (Signed)
Pt would like a CB concerning her lab results from DOS of 06/11/17. Please advise at [phone]

## 2017-06-13 NOTE — Patient Instructions (Addendum)
Do not use ibuprofen or naproxen while taking the mobic. Please ice the area three times per day for 15 minutes.  Low Back Strain Rehab Ask your health care provider which exercises are safe for you. Do exercises exactly as told by your health care provider and adjust them as directed. It is normal to feel mild stretching, pulling, tightness, or discomfort as you do these exercises, but you should stop right away if you feel sudden pain or your pain gets worse. Do not begin these exercises until told by your health care provider. Stretching and range of motion exercises These exercises warm up your muscles and joints and improve the movement and flexibility of your back. These exercises also help to relieve pain, numbness, and tingling. Exercise A: Single knee to chest  1. Lie on your back on a firm surface with both legs straight. 2. Bend one of your knees. Use your hands to move your knee up toward your chest until you feel a gentle stretch in your lower back and buttock. ? Hold your leg in this position by holding onto the front of your knee. ? Keep your other leg as straight as possible. 3. Hold for __________ seconds. 4. Slowly return to the starting position. 5. Repeat with your other leg. Repeat __________ times. Complete this exercise __________ times a day. Exercise B: Prone extension on elbows  1. Lie on your abdomen on a firm surface. 2. Prop yourself up on your elbows. 3. Use your arms to help lift your chest up until you feel a gentle stretch in your abdomen and your lower back. ? This will place some of your body weight on your elbows. If this is uncomfortable, try stacking pillows under your chest. ? Your hips should stay down, against the surface that you are lying on. Keep your hip and back muscles relaxed. 4. Hold for __________ seconds. 5. Slowly relax your upper body and return to the starting position. Repeat __________ times. Complete this exercise __________ times a  day. Strengthening exercises These exercises build strength and endurance in your back. Endurance is the ability to use your muscles for a long time, even after they get tired. Exercise C: Pelvic tilt 1. Lie on your back on a firm surface. Bend your knees and keep your feet flat. 2. Tense your abdominal muscles. Tip your pelvis up toward the ceiling and flatten your lower back into the floor. ? To help with this exercise, you may place a small towel under your lower back and try to push your back into the towel. 3. Hold for __________ seconds. 4. Let your muscles relax completely before you repeat this exercise. Repeat __________ times. Complete this exercise __________ times a day. Exercise D: Alternating arm and leg raises  1. Get on your hands and knees on a firm surface. If you are on a hard floor, you may want to use padding to cushion your knees, such as an exercise mat. 2. Line up your arms and legs. Your hands should be below your shoulders, and your knees should be below your hips. 3. Lift your left leg behind you. At the same time, raise your right arm and straighten it in front of you. ? Do not lift your leg higher than your hip. ? Do not lift your arm higher than your shoulder. ? Keep your abdominal and back muscles tight. ? Keep your hips facing the ground. ? Do not arch your back. ? Keep your balance carefully, and do  not hold your breath. 4. Hold for __________ seconds. 5. Slowly return to the starting position and repeat with your right leg and your left arm. Repeat __________ times. Complete this exercise __________times a day. Exercise J: Single leg lower with bent knees 1. Lie on your back on a firm surface. 2. Tense your abdominal muscles and lift your feet off the floor, one foot at a time, so your knees and hips are bent in an "L" shape (at about 90 degrees). ? Your knees should be over your hips and your lower legs should be parallel to the floor. 3. Keeping your  abdominal muscles tense and your knee bent, slowly lower one of your legs so your toe touches the ground. 4. Lift your leg back up to return to the starting position. ? Do not hold your breath. ? Do not let your back arch. Keep your back flat against the ground. 5. Repeat with your other leg. Repeat __________ times. Complete this exercise __________ times a day. Posture and body mechanics  Body mechanics refers to the movements and positions of your body while you do your daily activities. Posture is part of body mechanics. Good posture and healthy body mechanics can help to relieve stress in your body's tissues and joints. Good posture means that your spine is in its natural S-curve position (your spine is neutral), your shoulders are pulled back slightly, and your head is not tipped forward. The following are general guidelines for applying improved posture and body mechanics to your everyday activities. Standing   When standing, keep your spine neutral and your feet about hip-width apart. Keep a slight bend in your knees. Your ears, shoulders, and hips should line up.  When you do a task in which you stand in one place for a long time, place one foot up on a stable object that is 2-4 inches (5-10 cm) high, such as a footstool. This helps keep your spine neutral. Sitting   When sitting, keep your spine neutral and keep your feet flat on the floor. Use a footrest, if necessary, and keep your thighs parallel to the floor. Avoid rounding your shoulders, and avoid tilting your head forward.  When working at a desk or a computer, keep your desk at a height where your hands are slightly lower than your elbows. Slide your chair under your desk so you are close enough to maintain good posture.  When working at a computer, place your monitor at a height where you are looking straight ahead and you do not have to tilt your head forward or downward to look at the screen. Resting   When lying down  and resting, avoid positions that are most painful for you.  If you have pain with activities such as sitting, bending, stooping, or squatting (flexion-based activities), lie in a position in which your body does not bend very much. For example, avoid curling up on your side with your arms and knees near your chest (fetal position).  If you have pain with activities such as standing for a long time or reaching with your arms (extension-based activities), lie with your spine in a neutral position and bend your knees slightly. Try the following positions: ? Lying on your side with a pillow between your knees. ? Lying on your back with a pillow under your knees. Lifting   When lifting objects, keep your feet at least shoulder-width apart and tighten your abdominal muscles.  Bend your knees and hips and keep your  spine neutral. It is important to lift using the strength of your legs, not your back. Do not lock your knees straight out.  Always ask for help to lift heavy or awkward objects. This information is not intended to replace advice given to you by your health care provider. Make sure you discuss any questions you have with your health care provider. Document Released: 09/23/2005 Document Revised: 05/30/2016 Document Reviewed: 07/05/2015 Elsevier Interactive Patient Education  2018 [employer].     IF you received an x-ray today, you will receive an invoice from [facility]. Please contact [facility] at [phone] with questions or concerns regarding your invoice.   IF you received labwork today, you will receive an invoice from [facility]. Please contact [facility] at [phone] with questions or concerns regarding your invoice.   Our billing staff will not be able to assist you with questions regarding bills from these companies.  You will be contacted with the lab results as soon as they are available. The fastest way to get your results is to activate your My  Chart account. Instructions are located on the last page of this paperwork. If you have not heard from [place] regarding the results in 2 weeks, please contact this office.

## 2017-06-13 NOTE — Progress Notes (Signed)
PRIMARY CARE AT [facility] [address] [place] [address] [phone]  Date:  06/13/2017   Name:  Sherri Buckley   DOB:  01-08-1976   MRN:  [id number]  PCP:  Wardell Honour, MD    History of Present Illness:  Sherri Buckley is a 41 y.o. female patient who presents to PCP with  Chief Complaint  Patient presents with  . Back Pain    lower right side/ x 1 day   Woke up, and had right sided pain.  She attempted to get up, but she had pain with walking.  Pain described as pressure at her hip pain.  Pain does not radiate anywhere.  No abnormal frequency, no hematuria, or dysuria.  No known injury.  She does not recall sleeping any different position.  She was sleeping on her couch, which is normal for her.  No fever.     Bottom right side of back hurts  Patient noted that she had started her period when she went to give urine sample.   Patient Active Problem List   Diagnosis Date Noted  . Anxiety about health 02/17/2017  . Asthma 02/21/2016  . BMI 36.0-36.9,adult 02/21/2016  . Palpitation 12/22/2014  . Elevated troponin 12/21/2014  . Allergic rhinitis 03/06/2014  . Nipple discharge 06/21/2013  . Hypovitaminosis D 02/09/2013  . Alcohol abuse 02/09/2013    Past Medical History:  Diagnosis Date  . Allergy   . Asthma   . Blood type, Rh negative   . Chronic bronchitis ([place])    "get it q yr"  . Depression 10/07/1998  . QBHALPFX(902.4)    "monthly" (12/21/2014)  . Osteoarthritis of ankle, right   . Seasonal allergies   . Substance abuse     Past Surgical History:  Procedure Laterality Date  . BREAST BIOPSY Right ~ 1993   "took out some benign tissue"    Social History  Substance Use Topics  . Smoking status: Former Research scientist (life sciences)  . Smokeless tobacco: Never Used     Comment: blacky milds  . Alcohol use 0.0 oz/week     Comment: 12/21/2014 "quit drinking in 2013"    Family History  Problem Relation Age of Onset  . Heart disease Maternal Grandmother        9 bypass  surgery  . Cancer Maternal Grandmother        mouth  . Sickle cell trait Father   . Seizures Father   . Alcohol abuse Father   . Thyroid disease Mother        as a child  . Hypertension Mother   . Anemia Mother   . Migraines Mother   . Hernia Mother   . Diabetes Mother   . Hypertension Paternal Grandmother   . Diabetes Paternal Grandmother   . Arthritis Maternal Aunt     No Known Allergies  Medication list has been reviewed and updated.  Current Outpatient Prescriptions on File Prior to Visit  Medication Sig Dispense Refill  . albuterol (PROVENTIL HFA;VENTOLIN HFA) 108 (90 Base) MCG/ACT inhaler Inhale 2 puffs into the lungs every 6 (six) hours as needed for wheezing or shortness of breath. 18 g 1  . beclomethasone (QVAR) 40 MCG/ACT inhaler INHALE TWO PUFFS INTO THE LUNGS TWO TIMES DAILY 3 Inhaler 3  . fluticasone (FLONASE) 50 MCG/ACT nasal spray Place 2 sprays into both nostrils daily. 16 g 0  . Multiple Vitamin (MULTIVITAMIN WITH MINERALS) TABS Take 1 tablet by mouth daily.  Current Facility-Administered Medications on File Prior to Visit  Medication Dose Route Frequency Provider Last Rate Last Dose  . albuterol (PROVENTIL) (2.5 MG/3ML) 0.083% nebulizer solution 2.5 mg  2.5 mg Nebulization Once Wardell Honour, MD        ROS ROS otherwise unremarkable unless listed above.  Physical Examination: BP 111/76   Pulse 86   Temp 98.2 F (36.8 C) (Oral)   Resp 16   Ht 5\' 1"  (1.549 m)   Wt 196 lb (88.9 kg)   LMP 05/20/2017 (Approximate)   SpO2 99%   BMI 37.03 kg/m  Ideal Body Weight: Weight in (lb) to have BMI = 25: 132  Physical Exam  Constitutional: She is oriented to person, place, and time. She appears well-developed and well-nourished. No distress.  HENT:  Head: Normocephalic and atraumatic.  Right Ear: External ear normal.  Left Ear: External ear normal.  Eyes: Pupils are equal, round, and reactive to light. Conjunctivae and EOM are normal.  Cardiovascular:  Normal rate, regular rhythm, normal heart sounds and intact distal pulses.   No murmur heard. Pulmonary/Chest: Effort normal. No respiratory distress.  Musculoskeletal:  Right sided low flank pain above the pelvic bone.  No spinous or iliac tenderness.    Neurological: She is alert and oriented to person, place, and time.  Skin: She is not diaphoretic.  Psychiatric: She has a normal mood and affect. Her behavior is normal.    Results for orders placed or performed in visit on 06/13/17  POCT urinalysis dipstick  Result Value Ref Range   Color, UA brown (A) yellow   Clarity, UA cloudy (A) clear   Glucose, UA negative negative mg/dL   Bilirubin, UA small (A) negative   Ketones, POC UA trace (5) (A) negative mg/dL   Spec Grav, UA 1.025 1.010 - 1.025   Blood, UA large (A) negative   pH, UA 6.0 5.0 - 8.0   Protein Ur, POC =100 (A) negative mg/dL   Urobilinogen, UA 0.2 0.2 or 1.0 E.U./dL   Nitrite, UA Negative Negative   Leukocytes, UA Negative Negative    Assessment and Plan: Sherri Buckley is a 41 y.o. female who is here today for cc of  Chief Complaint  Patient presents with  . Back Pain    lower right side/ x 1 day  advised icing and nsaid.  Precaution of mobic advised.  rtc in 2 weeks if sxs do not improve Stretches given Acute right-sided low back pain without sciatica - Plan: POCT urinalysis dipstick, meloxicam (MOBIC) 15 MG tablet, CANCELED: POCT Microscopic Urinalysis ([facility])  Ivar Drape, PA-C [facility] 9/12/201811:13 AM

## 2017-06-19 DIAGNOSIS — M545 Low back pain: Secondary | ICD-10-CM | POA: Diagnosis not present

## 2017-06-19 NOTE — Telephone Encounter (Signed)
Pt informed of labs

## 2017-06-23 DIAGNOSIS — M545 Low back pain: Secondary | ICD-10-CM | POA: Diagnosis not present

## 2017-06-26 DIAGNOSIS — M545 Low back pain: Secondary | ICD-10-CM | POA: Diagnosis not present

## 2017-06-30 DIAGNOSIS — M545 Low back pain: Secondary | ICD-10-CM | POA: Diagnosis not present

## 2017-07-03 DIAGNOSIS — M545 Low back pain: Secondary | ICD-10-CM | POA: Diagnosis not present

## 2017-07-03 DIAGNOSIS — M5136 Other intervertebral disc degeneration, lumbar region: Secondary | ICD-10-CM | POA: Diagnosis not present

## 2017-07-03 DIAGNOSIS — S335XXA Sprain of ligaments of lumbar spine, initial encounter: Secondary | ICD-10-CM | POA: Diagnosis not present

## 2017-07-07 ENCOUNTER — Ambulatory Visit: Payer: Federal, State, Local not specified - PPO | Admitting: Family Medicine

## 2017-07-14 ENCOUNTER — Ambulatory Visit: Payer: Federal, State, Local not specified - PPO | Admitting: Family Medicine

## 2017-07-14 DIAGNOSIS — R569 Unspecified convulsions: Secondary | ICD-10-CM | POA: Diagnosis not present

## 2017-07-14 DIAGNOSIS — R202 Paresthesia of skin: Secondary | ICD-10-CM | POA: Diagnosis not present

## 2017-07-31 ENCOUNTER — Other Ambulatory Visit: Payer: Self-pay | Admitting: General Surgery

## 2017-07-31 DIAGNOSIS — R5381 Other malaise: Secondary | ICD-10-CM

## 2017-08-05 ENCOUNTER — Other Ambulatory Visit: Payer: Self-pay | Admitting: General Surgery

## 2017-08-05 DIAGNOSIS — N6452 Nipple discharge: Secondary | ICD-10-CM

## 2017-08-11 DIAGNOSIS — M19071 Primary osteoarthritis, right ankle and foot: Secondary | ICD-10-CM | POA: Diagnosis not present

## 2017-08-11 DIAGNOSIS — M79671 Pain in right foot: Secondary | ICD-10-CM | POA: Diagnosis not present

## 2017-08-25 ENCOUNTER — Other Ambulatory Visit: Payer: Self-pay

## 2017-08-25 ENCOUNTER — Ambulatory Visit: Payer: Federal, State, Local not specified - PPO | Admitting: Physician Assistant

## 2017-08-25 ENCOUNTER — Encounter: Payer: Self-pay | Admitting: Physician Assistant

## 2017-08-25 VITALS — BP 122/78 | HR 93 | Temp 98.0°F | Resp 16 | Ht 61.25 in | Wt 207.2 lb

## 2017-08-25 DIAGNOSIS — M79672 Pain in left foot: Secondary | ICD-10-CM

## 2017-08-25 DIAGNOSIS — M79662 Pain in left lower leg: Secondary | ICD-10-CM

## 2017-08-25 DIAGNOSIS — I8392 Asymptomatic varicose veins of left lower extremity: Secondary | ICD-10-CM

## 2017-08-25 MED ORDER — MELOXICAM 15 MG PO TABS: 15.0000 mg | ORAL_TABLET | Freq: Every day | ORAL | 0 refills | Status: DC

## 2017-08-25 NOTE — Progress Notes (Signed)
RAEL TILLY  MRN: [id number] DOB: 1976/10/07  Subjective:  Sherri Buckley is a 41 y.o. female seen in office today for a chief complaint of left foot pain, left bottom foot swelling, and left calf pain x 5 days. Symptoms have stayed about the same. Denies acute injury. Denies redness, warmth, and swelling of the calf. Denies numbness and tingling. Denies thinning hair on lower extremities and lower extremity ulcers.  Pain is aggravated when she walks. Does notice her spider veins have been spreading as she gets older. Her mother had very bad varicose veins. Denies smoking, recent traveling, recent hospitalizations,and oral hormone replacement. No hx of blood clots or hx of cancer. Has not tried anything for pain.  Of note, pt tends to put more weight on the left foot compared to the right due to arthritis in the right ankle.  Does wear supportive shoes.  She does not mix of walking and sitting at work.  Review of Systems  Constitutional: Negative for chills, fatigue and fever.  Respiratory: Negative for cough and shortness of breath.   Cardiovascular: Negative for chest pain, palpitations and leg swelling.  Gastrointestinal: Negative for nausea and vomiting.    Patient Active Problem List   Diagnosis Date Noted  . Anxiety about health 02/17/2017  . Asthma 02/21/2016  . BMI 36.0-36.9,adult 02/21/2016  . Palpitation 12/22/2014  . Elevated troponin 12/21/2014  . Allergic rhinitis 03/06/2014  . Nipple discharge 06/21/2013  . Hypovitaminosis D 02/09/2013  . Alcohol abuse 02/09/2013    Current Outpatient Medications on File Prior to Visit  Medication Sig Dispense Refill  . albuterol (PROVENTIL HFA;VENTOLIN HFA) 108 (90 Base) MCG/ACT inhaler Inhale 2 puffs into the lungs every 6 (six) hours as needed for wheezing or shortness of breath. 18 g 1  . beclomethasone (QVAR) 40 MCG/ACT inhaler INHALE TWO PUFFS INTO THE LUNGS TWO TIMES DAILY 3 Inhaler 3  . fluticasone (FLONASE) 50 MCG/ACT  nasal spray Place 2 sprays into both nostrils daily. 16 g 0  . Multiple Vitamin (MULTIVITAMIN WITH MINERALS) TABS Take 1 tablet by mouth daily.    . meloxicam (MOBIC) 15 MG tablet Take 1 tablet (15 mg total) by mouth daily. (Patient not taking: Reported on 08/25/2017) 30 tablet 0   Current Facility-Administered Medications on File Prior to Visit  Medication Dose Route Frequency Provider Last Rate Last Dose  . albuterol (PROVENTIL) (2.5 MG/3ML) 0.083% nebulizer solution 2.5 mg  2.5 mg Nebulization Once Wardell Honour, MD        No Known Allergies     Objective:  BP 122/78 (BP Location: Left Arm, Patient Position: Sitting, Cuff Size: Large)   Pulse (!) 109   Temp 98 F (36.7 C) (Oral)   Resp 16   Ht 5' 1.25" (1.556 m)   Wt 207 lb 3.2 oz (94 kg)   LMP 08/04/2017   SpO2 99%   BMI 38.83 kg/m   Physical Exam  Constitutional: She is oriented to person, place, and time and well-developed, well-nourished, and in no distress.  HENT:  Head: Normocephalic and atraumatic.  Eyes: Conjunctivae are normal.  Neck: Normal range of motion.  Cardiovascular:  Pulses:      Dorsalis pedis pulses are 2+ on the right side, and 2+ on the left side.       Posterior tibial pulses are 2+ on the right side, and 2+ on the left side.  Pulmonary/Chest: Effort normal.  Musculoskeletal:       Right lower leg: She  exhibits no tenderness, no bony tenderness and no swelling.       Left lower leg: She exhibits no tenderness, no bony tenderness and no swelling.       Left foot: There is swelling (mild swelling at ball of foot). There is normal range of motion, no tenderness and normal capillary refill.  Calf measures 46.25 cm in right and 46.5 cm in left.  Negative Homan's sign in left lower extremity.   Neurological: She is alert and oriented to person, place, and time. Gait normal.  Lower extremity muscular strength is 5/5 bilaterally.  Sensation of feet intact bilaterally.    Skin: Skin is warm and dry.    Multiple telangectasia noted in bilateral lower extremities. Few varicose veins noted in posterior aspect of left lower extremity.   No redness, warmth, or tenderness of lower extremities bilaterally.  Psychiatric: Affect normal.  Vitals reviewed.      Assessment and Plan :  1. Left foot pain There is mild swelling about the left foot but otherwise exam is completely normal.  This could be due to overuse injury as she does put more weight on her left foot compared to her right foot daily.  There is no obvious injury.  Will treat with anti-inflammatory, rest, elevation, and ice for the next week.  Return for further evaluation by PCP in 1 week if no improvement with conservative treatment.  Return sooner if symptoms worsen. - meloxicam (MOBIC) 15 MG tablet; Take 1 tablet (15 mg total) daily by mouth.  Dispense: 30 tablet; Refill: 0 - Ambulatory referral to Vascular Surgery - Care order/instruction:  2. Pain of left calf Once again, I think this has to do with improper biomechanics.  Instructed to start applying equal amount of weight to each foot when walking.  There are no acute findings on exam.  Patient was educated on signs of DVT to be aware of.  Educated that if she develops any redness, warmth, or swelling in the left lower calf to seek care immediately.  3. Varicose veins of left lower extremity, unspecified whether complicated Due to telangectasia and varicose veins of lower extremities, will refer to vein and vascular at this time.  Instructed to wear compression stockings while sitting or standing for long periods at work. - Ambulatory referral to Vascular Surgery  Tenna Delaine PA-C  [facility] 08/25/2017 8:34 AM

## 2017-08-25 NOTE — Patient Instructions (Addendum)
If physical exam findings are reassuring.  You do have a little bit of swelling about the foot.  I recommend using an anti-inflammatory for the next week.  Also recommend applying cold ice pack to the bottom of her foot 4-5 times a day for 20 minutes at a time.  Elevate your feet when you are at home.  You are likely experiencing this pain from overuse of the left foot.  Make sure you apply equal amount of weight each foot when walking.  For lower extremity spider veins and varicose veins, I am going to referral to vein and vascular.  They should contact you within 1-2 weeks.  In the meantime, I do recommend wearing compression stockings while sitting for prolonged periods or standing for long periods at work.  If you develop any redness, warmth, swelling, or exquisite pain in the calf, please seek care immediately.  If you are still having this foot pain in the next week please follow-up with your PCP.  Foot Pain Many things can cause foot pain. Some common causes are:  An injury.  A sprain.  Arthritis.  Blisters.  Bunions.  Follow these instructions at home: Pay attention to any changes in your symptoms. Take these actions to help with your discomfort:  If directed, put ice on the affected area: ? Put ice in a plastic bag. ? Place a towel between your skin and the bag. ? Leave the ice on for 15-20 minutes, 3?4 times a day for 2 days.  Take over-the-counter and prescription medicines only as told by your health care provider.  Wear comfortable, supportive shoes that fit you well. Do not wear high heels.  Do not stand or walk for long periods of time.  Do not lift a lot of weight. This can put added pressure on your feet.  Do stretches to relieve foot pain and stiffness as told by your health care provider.  Rub your foot gently.  Keep your feet clean and dry.  Contact a health care provider if:  Your pain does not get better after a few days of self-care.  Your pain  gets worse.  You cannot stand on your foot. Get help right away if:  Your foot is numb or tingling.  Your foot or toes are swollen.  Your foot or toes turn white or blue.  You have warmth and redness along your foot. This information is not intended to replace advice given to you by your health care provider. Make sure you discuss any questions you have with your health care provider. Document Released: 10/20/2015 Document Revised: 02/29/2016 Document Reviewed: 10/19/2014 Elsevier Interactive Patient Education  2018 [employer].    IF you received an x-ray today, you will receive an invoice from [facility]. Please contact [facility] at [phone] with questions or concerns regarding your invoice.   IF you received labwork today, you will receive an invoice from [facility]. Please contact [facility] at [phone] with questions or concerns regarding your invoice.   Our billing staff will not be able to assist you with questions regarding bills from these companies.  You will be contacted with the lab results as soon as they are available. The fastest way to get your results is to activate your My Chart account. Instructions are located on the last page of this paperwork. If you have not heard from [place] regarding the results in 2 weeks, please contact this office.

## 2017-09-01 ENCOUNTER — Ambulatory Visit: Payer: Federal, State, Local not specified - PPO | Admitting: Family Medicine

## 2017-09-02 ENCOUNTER — Ambulatory Visit: Payer: Federal, State, Local not specified - PPO | Admitting: Family Medicine

## 2017-09-03 ENCOUNTER — Ambulatory Visit: Payer: Federal, State, Local not specified - PPO | Admitting: Physician Assistant

## 2017-09-08 ENCOUNTER — Ambulatory Visit
Admission: RE | Admit: 2017-09-08 | Discharge: 2017-09-08 | Disposition: A | Discharge status: Home / Self Care | Payer: Federal, State, Local not specified - PPO | Source: Ambulatory Visit | Attending: General Surgery | Admitting: General Surgery

## 2017-09-08 DIAGNOSIS — N6489 Other specified disorders of breast: Secondary | ICD-10-CM | POA: Diagnosis not present

## 2017-09-08 DIAGNOSIS — N6452 Nipple discharge: Secondary | ICD-10-CM

## 2017-09-08 MED ORDER — GADOBENATE DIMEGLUMINE 529 MG/ML IV SOLN
20.0000 mL | Freq: Once | INTRAVENOUS | Status: AC | PRN
  Administered 2017-09-08: 20 mL via INTRAVENOUS

## 2017-09-08 NOTE — Progress Notes (Signed)
Please let patient know MR breast was negative.

## 2017-09-11 ENCOUNTER — Other Ambulatory Visit: Payer: Self-pay

## 2017-09-11 ENCOUNTER — Ambulatory Visit: Payer: Federal, State, Local not specified - PPO | Admitting: Physician Assistant

## 2017-09-11 ENCOUNTER — Other Ambulatory Visit: Payer: Self-pay | Admitting: General Surgery

## 2017-09-11 DIAGNOSIS — I83812 Varicose veins of left lower extremities with pain: Secondary | ICD-10-CM

## 2017-09-15 ENCOUNTER — Ambulatory Visit: Payer: Federal, State, Local not specified - PPO | Admitting: Physician Assistant

## 2017-10-06 ENCOUNTER — Ambulatory Visit: Payer: Federal, State, Local not specified - PPO | Admitting: Physician Assistant

## 2017-10-13 ENCOUNTER — Ambulatory Visit: Payer: Federal, State, Local not specified - PPO | Admitting: Physician Assistant

## 2017-10-13 ENCOUNTER — Encounter: Payer: Self-pay | Admitting: Physician Assistant

## 2017-10-13 ENCOUNTER — Other Ambulatory Visit: Payer: Self-pay

## 2017-10-13 VITALS — BP 120/80 | HR 97 | Temp 98.2°F | Resp 18 | Ht 62.21 in | Wt 211.8 lb

## 2017-10-13 DIAGNOSIS — R21 Rash and other nonspecific skin eruption: Secondary | ICD-10-CM | POA: Diagnosis not present

## 2017-10-13 DIAGNOSIS — L503 Dermatographic urticaria: Secondary | ICD-10-CM

## 2017-10-13 DIAGNOSIS — Z711 Person with feared health complaint in whom no diagnosis is made: Secondary | ICD-10-CM

## 2017-10-13 LAB — CBC WITH DIFFERENTIAL/PLATELET
Basophils Absolute: 0 10*3/uL (ref 0.0–0.2)
Basos: 0 %
EOS (ABSOLUTE): 0.6 10*3/uL — ABNORMAL HIGH (ref 0.0–0.4)
EOS: 7 %
HEMATOCRIT: 40.5 % (ref 34.0–46.6)
Hemoglobin: 13.7 g/dL (ref 11.1–15.9)
Immature Grans (Abs): 0 10*3/uL (ref 0.0–0.1)
Immature Granulocytes: 0 %
LYMPHS ABS: 2.2 10*3/uL (ref 0.7–3.1)
Lymphs: 25 %
MCH: 27.5 pg (ref 26.6–33.0)
MCHC: 33.8 g/dL (ref 31.5–35.7)
MCV: 81 fL (ref 79–97)
MONOS ABS: 0.7 10*3/uL (ref 0.1–0.9)
Monocytes: 8 %
NEUTROS ABS: 5.3 10*3/uL (ref 1.4–7.0)
Neutrophils: 60 %
Platelets: 344 10*3/uL (ref 150–379)
RBC: 4.98 x10E6/uL (ref 3.77–5.28)
RDW: 15 % (ref 12.3–15.4)
WBC: 8.9 10*3/uL (ref 3.4–10.8)

## 2017-10-13 MED ORDER — HYDROXYZINE HCL 25 MG PO TABS: 12.5000 mg | ORAL_TABLET | Freq: Three times a day (TID) | ORAL | 0 refills | Status: DC | PRN

## 2017-10-13 NOTE — Patient Instructions (Addendum)
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 Simple dermographism that is nonpruritic requires no therapy, and treatment involves avoidance of inciting triggers. If the skin appears dry, emollients (such as hydrated petrolatum applied daily immediately after bathing) can be helpful.  This could be due to underlying allergy.  I would make sure use switch laundry detergents to a non-scented skin sensitive laundry detergent. If it becomes itching, I have given you a prescription for a strong antihistamine to take at night time. This can make you drowsy, so use cautiously.  Please collected a CBC today and will contact you with those results.  In the meantime, make sure you are avoiding excess salt and exercising as tolerated.  I also recommend doing daily tumeric (about a teaspoon) in a smoothie, as this can help with underlying inflammation. Follow up with allergy specialist if your rash continues.     Rash A rash is a change in the color of the skin. A rash can also change the way your skin feels. There are many different conditions and factors that can cause a rash. Follow these instructions at home: Pay attention to any changes in your symptoms. Follow these instructions to help with your condition: Medicine Take or apply over-the-counter and prescription medicines only as told by your doctor. These may include:  Corticosteroid cream.  Anti-itch lotions.  Oral antihistamines.  Skin Care  Put cool compresses on the affected areas.  Try taking a bath with: ? Epsom salts. Follow the instructions on the packaging. You can get these at your local pharmacy or grocery store. ? Baking soda. Pour a small amount into the bath as told by your doctor. ? Colloidal oatmeal. Follow the instructions on the packaging. You can get this at your local pharmacy or grocery store.  Try putting baking soda paste onto your skin. Stir water into baking soda until it gets like a paste.  Do not scratch or rub your  skin.  Avoid covering the rash. Make sure the rash is exposed to air as much as possible. General instructions  Avoid hot showers or baths, which can make itching worse. A cold shower may help.  Avoid scented soaps, detergents, and perfumes. Use gentle soaps, detergents, perfumes, and other cosmetic products.  Avoid anything that causes your rash. Keep a journal to help track what causes your rash. Write down: ? What you eat. ? What cosmetic products you use. ? What you drink. ? What you wear. This includes jewelry.  Keep all follow-up visits as told by your doctor. This is important. Contact a doctor if:  You sweat at night.  You lose weight.  You pee (urinate) more than normal.  You feel weak.  You throw up (vomit).  Your skin or the whites of your eyes look yellow (jaundice).  Your skin: ? Tingles. ? Is numb.  Your rash: ? Does not go away after a few days. ? Gets worse.  You are: ? More thirsty than normal. ? More tired than normal.  You have: ? New symptoms. ? Pain in your belly (abdomen). ? A fever. ? Watery poop (diarrhea). Get help right away if:  Your rash covers all or most of your body. The rash may or may not be painful.  You have blisters that: ? Are on top of the rash. ? Grow larger. ? Grow together. ? Are painful. ? Are inside your nose or mouth.  You have a rash that: ? Looks like purple pinprick-sized spots all over your body. ?  Has a "bull's eye" or looks like a target. ? Is red and painful, causes your skin to peel, and is not from being in the sun too long. This information is not intended to replace advice given to you by your health care provider. Make sure you discuss any questions you have with your health care provider. Document Released: 03/11/2008 Document Revised: 02/29/2016 Document Reviewed: 02/08/2015 Elsevier Interactive Patient Education  2018 [employer].  IF you received an x-ray today, you will receive an invoice  from [facility]. Please contact [facility] at [phone] with questions or concerns regarding your invoice.   IF you received labwork today, you will receive an invoice from [facility]. Please contact [facility] at [phone] with questions or concerns regarding your invoice.   Our billing staff will not be able to assist you with questions regarding bills from these companies.  You will be contacted with the lab results as soon as they are available. The fastest way to get your results is to activate your My Chart account. Instructions are located on the last page of this paperwork. If you have not heard from [place] regarding the results in 2 weeks, please contact this office.

## 2017-10-13 NOTE — Progress Notes (Signed)
Sherri Buckley  MRN: [id number] DOB: 16-Sep-1976  Subjective:  Sherri Buckley is a 42 y.o. female seen in office today for a chief complaint of rash on anterior chest x months. Notes she will wake up and have hives on her chest, that resolved after a few hours.  She denies pain or pruritus.  Did have itching with it one time and took over-the-counter antihistamine, which worked.  Denies fever, chills, diaphoresis, difficulty swallowing, shortness of breath, voice change, weight loss, nausea, vomiting, and abdominal pain.  She does not recall scratching in the areas where the rash appears.  Of note, she did change laundry detergents a while back but then changed back to her old detergent.  Denies new exposure to soaps, lotions, medications, plants, and animals.  Of note, patient has already had this evaluated at another primary care.  They recommended she see an allergy specialist and placed this referral.  Patient notes that allergy specialist called her but she did not answer to schedule the appointment.  She would also like me to examine her right shoulder as she feels like she saw some overlying swelling this morning.  Denies acute injury, shoulder pain, limited range of motion, numbness, tingling, and weakness.  Has no other questions or complaints today.  Review of Systems  Constitutional: Negative for activity change and fatigue.  Musculoskeletal: Negative for arthralgias, joint swelling and myalgias.  Allergic/Immunologic: Positive for environmental allergies.  Neurological: Negative for dizziness and light-headedness.    Patient Active Problem List   Diagnosis Date Noted  . Anxiety about health 02/17/2017  . Asthma 02/21/2016  . BMI 36.0-36.9,adult 02/21/2016  . Palpitation 12/22/2014  . Elevated troponin 12/21/2014  . Allergic rhinitis 03/06/2014  . Nipple discharge 06/21/2013  . Hypovitaminosis D 02/09/2013  . Alcohol abuse 02/09/2013    Current Outpatient Medications  on File Prior to Visit  Medication Sig Dispense Refill  . albuterol (PROVENTIL HFA;VENTOLIN HFA) 108 (90 Base) MCG/ACT inhaler Inhale 2 puffs into the lungs every 6 (six) hours as needed for wheezing or shortness of breath. 18 g 1  . beclomethasone (QVAR) 40 MCG/ACT inhaler INHALE TWO PUFFS INTO THE LUNGS TWO TIMES DAILY 3 Inhaler 3  . fluticasone (FLONASE) 50 MCG/ACT nasal spray Place 2 sprays into both nostrils daily. 16 g 0  . Multiple Vitamin (MULTIVITAMIN WITH MINERALS) TABS Take 1 tablet by mouth daily.    . meloxicam (MOBIC) 15 MG tablet Take 1 tablet (15 mg total) daily by mouth. (Patient not taking: Reported on 10/13/2017) 30 tablet 0   Current Facility-Administered Medications on File Prior to Visit  Medication Dose Route Frequency Provider Last Rate Last Dose  . albuterol (PROVENTIL) (2.5 MG/3ML) 0.083% nebulizer solution 2.5 mg  2.5 mg Nebulization Once Wardell Honour, MD        No Known Allergies   Objective:  BP 120/80 (BP Location: Left Arm, Patient Position: Sitting, Cuff Size: Large)   Pulse 97   Temp 98.2 F (36.8 C) (Oral)   Resp 18   Ht 5' 2.21" (1.58 m)   Wt 211 lb 12.8 oz (96.1 kg)   LMP 09/23/2017 (Exact Date)   SpO2 99%   BMI 38.48 kg/m   Physical Exam  Constitutional: She is oriented to person, place, and time and well-developed, well-nourished, and in no distress.  HENT:  Head: Normocephalic and atraumatic.  Eyes: Conjunctivae are normal.  Neck: Normal range of motion.  Pulmonary/Chest: Effort normal.  Musculoskeletal:  Right shoulder: She exhibits normal range of motion, no tenderness, no bony tenderness, no swelling, no spasm and normal strength.       Cervical back: Normal. She exhibits normal range of motion, no tenderness and no bony tenderness.  Lymphadenopathy:       Head (right side): No submental, no submandibular, no tonsillar, no preauricular, no posterior auricular and no occipital adenopathy present.       Head (left side): No  submandibular, no tonsillar, no preauricular, no posterior auricular and no occipital adenopathy present.    She has no cervical adenopathy.       Right: No supraclavicular adenopathy present.       Left: No supraclavicular adenopathy present.  Neurological: She is alert and oriented to person, place, and time. Gait normal.  Skin: Skin is warm and dry. Rash noted. Rash is urticarial (inducible urticaria noted on anterior and posterior trunk and neck).  Psychiatric: Affect normal.  Vitals reviewed.   Assessment and Plan :  1. Rash and nonspecific skin eruption - hydrOXYzine (ATARAX/VISTARIL) 25 MG tablet; Take 0.5-1 tablets (12.5-25 mg total) by mouth every 8 (eight) hours as needed for itching.  Dispense: 30 tablet; Refill: 0 - CBC with Differential/Platelet 2. Dermatographism Rash consistent with dermographism.  It is not pruritic, which is reassuring.  Patient given hydroxyzine in case it becomes pruritic.  Educated patient that this can be aggravated by dry skin.  Advised to buy a skin sensitive lotion, such as Aveeno, or emoillent, and apply daily after bathing.  Also educated patient to continue with the laundry detergent that does not irritate her skin as this could have been brought on by new laundry detergent as well. Advised to return to clinic if symptoms worsen, do not improve, or as needed.  3. Feared condition not demonstrated No swelling noted on shoulder exam.  Patient reassured.  Follow-up as needed.  Tenna Delaine PA-C  [facility] 10/13/2017 1:52 PM

## 2017-10-15 ENCOUNTER — Encounter: Payer: Self-pay | Admitting: Physician Assistant

## 2017-10-16 ENCOUNTER — Other Ambulatory Visit: Payer: Self-pay | Admitting: Physician Assistant

## 2017-10-16 DIAGNOSIS — R21 Rash and other nonspecific skin eruption: Secondary | ICD-10-CM

## 2017-10-16 NOTE — Progress Notes (Signed)
Orders Placed This Encounter  Procedures   Ambulatory referral to Allergy    Referral Priority:   Routine    Referral Type:   Allergy Testing    Referral Reason:   Specialty Services Required    Requested Specialty:   Allergy    Number of Visits Requested:   1    

## 2017-10-24 ENCOUNTER — Encounter: Payer: Self-pay | Admitting: Vascular Surgery

## 2017-10-24 ENCOUNTER — Encounter (HOSPITAL_COMMUNITY): Payer: Self-pay

## 2017-11-17 ENCOUNTER — Inpatient Hospital Stay (HOSPITAL_COMMUNITY): Admission: RE | Admit: 2017-11-17 | Payer: Self-pay | Source: Ambulatory Visit

## 2017-11-17 ENCOUNTER — Encounter: Payer: Federal, State, Local not specified - PPO | Admitting: Surgery

## 2017-11-24 ENCOUNTER — Other Ambulatory Visit: Payer: Self-pay | Admitting: Family Medicine

## 2017-11-24 DIAGNOSIS — Z1231 Encounter for screening mammogram for malignant neoplasm of breast: Secondary | ICD-10-CM

## 2017-12-15 ENCOUNTER — Encounter: Payer: Self-pay | Admitting: Allergy & Immunology

## 2017-12-15 ENCOUNTER — Ambulatory Visit: Payer: Federal, State, Local not specified - PPO | Admitting: Allergy & Immunology

## 2017-12-15 VITALS — BP 130/78 | HR 92 | Temp 98.7°F | Resp 18 | Ht 61.0 in | Wt 210.0 lb

## 2017-12-15 DIAGNOSIS — J3089 Other allergic rhinitis: Secondary | ICD-10-CM | POA: Diagnosis not present

## 2017-12-15 DIAGNOSIS — J302 Other seasonal allergic rhinitis: Secondary | ICD-10-CM | POA: Diagnosis not present

## 2017-12-15 DIAGNOSIS — J4530 Mild persistent asthma, uncomplicated: Secondary | ICD-10-CM

## 2017-12-15 DIAGNOSIS — L508 Other urticaria: Secondary | ICD-10-CM

## 2017-12-15 MED ORDER — FLUTICASONE PROPIONATE 93 MCG/ACT NA EXHU: 2.0000 | INHALANT_SUSPENSION | Freq: Two times a day (BID) | NASAL | 5 refills | Status: DC

## 2017-12-15 NOTE — Progress Notes (Signed)
NEW PATIENT  Date of Service/Encounter:  12/15/17  Referring provider: Wardell Honour, MD   Assessment:    Mild persistent asthma, uncomplicated   Seasonal and perennial allergic rhinitis (trees, weeds, grasses, indoor molds, dust mites and cockroach)   Chronic urticaria   Asthma Reportables:  Severity: mild persistent  Risk: low Control: well controlled  Plan/Recommendations:   1. Mild persistent asthma, uncomplicated - Lung testing looks great today. - It seems that the Qvar is working very well for you.  - Daily controller medication(s): Qvar 26mcg Redihaler 1 puffs once daily - Prior to physical activity: ProAir 2 puffs 10-15 minutes before physical activity. - Rescue medications: ProAir 4 puffs every 4-6 hours as needed - Changes during respiratory infections or worsening symptoms: Increase Qvar 108mcg to 4 puffs twice daily for TWO WEEKS. - Asthma control goals:  * Full participation in all desired activities (may need albuterol before activity) * Albuterol use two time or less a week on average (not counting use with activity) * Cough interfering with sleep two time or less a month * Oral steroids no more than once a year * No hospitalizations  2. Chronic rhinitis - Testing today showed: trees, weeds, grasses, indoor molds, dust mites and cockroach - Avoidance measures provided. - Continue with: Zyrtec as below  - Start taking: Xhance (fluticasone) 1-2 sprays per nostril daily - You can use an extra dose of the antihistamine, if needed, for breakthrough symptoms.  - Consider nasal saline rinses 1-2 times daily to remove allergens from the nasal cavities as well as help with mucous clearance (this is especially helpful to do before the nasal sprays are given) - Consider allergy shots as a means of long-term control. - Allergy shots "re-train" and "reset" the immune system to ignore environmental allergens and decrease the resulting immune response to those  allergens (sneezing, itchy watery eyes, runny nose, nasal congestion, etc).    - Allergy shots improve symptoms in 75-85% of patients.  - We can discuss more at the next appointment if the medications are not working for you.  3. Chronic urticaria - Your history does not have any "red flags" such as fevers, joint pains, or permanent skin changes that would be concerning for a more serious cause of hives.  - We will get some labs to rule out serious causes of hives: tryptase level, CMP anti-thyroid antibodies. - We will call you in 1-2 weeks with the results.  - Chronic hives are often times a self limited process and will "burn themselves out" over 6-12 months, although this is not always the case.  - In the meantime, start suppressive dosing of antihistamines:   - Morning: Zyrtec (cetirizine) 20mg  (two tablets)  - Evening: Zyrtec (cetirizine) 20mg  (two tablets) + Zantac (ranitidine) 75mg  - 150mg  (one to two tablets) - You can change this dosing at home, decreasing the dose as needed or increasing the dosing as needed.  - If you are not tolerating the medications or are tired of taking them every day, we can start treatment with a monthly injectable medication called Xolair.   4. Return in about 2 months (around 02/14/2018).   Subjective:   Sherri Buckley is a 42 y.o. female presenting today for evaluation of  Chief Complaint  Patient presents with  . Urticaria    ongoing x6 mo. Comes and goes within small time intervals. Zyrtec and Zantac does seem to help. Her blood work showed elevated eosinophils.     Sherri Malta  E Buckley has a history of the following: Patient Active Problem List   Diagnosis Date Noted  . Seasonal and perennial allergic rhinitis 12/15/2017  . Chronic urticaria 12/15/2017  . Mild persistent asthma, uncomplicated 56/31/4970  . Anxiety about health 02/17/2017  . Asthma 02/21/2016  . BMI 36.0-36.9,adult 02/21/2016  . Palpitation 12/22/2014  . Elevated troponin  12/21/2014  . Allergic rhinitis 03/06/2014  . Nipple discharge 06/21/2013  . Hypovitaminosis D 02/09/2013  . Alcohol abuse 02/09/2013    History obtained from: chart review and patient.  Sherri Buckley was referred by Wardell Honour, MD.     Sherri Buckley is a 42 y.o. female presenting for evaluation of urticaria. She has had a rash that comes and goes around six months ago. At the time, she is unsure about new exposures. She was hoping htat it would resolve on its own. It did not bother her when it was on her body and then it started getting on her face which seemed to bother her more. Her skin returns to normal upon resolution. Lesions stick around for around ten minutes. She denies joint pain, swelling, and fevers.   She stopped taking the cetirizine on Thursday and then the itching became very intense. She was taking one tablet daily. Her PCP did recommend to take Zantac with it because her eosinophils were elevated. She did feel that the Zantac did provide some relief. Her PCP recommended using fragrance free detergent. She uses unscented [employer] for the past ten years. She uses Vaseline intensive care lotion. She does use All Free and Clear.   She does not eat peanuts often, but she does eat cashews and pistachios. She loves eggs and eats wheat. She does eat seafood and cheese. She drinks soy milk.    Sherri Buckley does have a history of asthma. She does use Qvar and an emergency inhaler. She has been told that she has asthma, but she thinks that she hears "crackling" in her upper airways. The Qvar two puffs once daily seems to clear it up. She denies the use of prednisone for her breathing. She was diagnosed around the age of 62 years.   Sherri Buckley does have a history of chronic sinusitis, noted on various MRIs. She has these performed due to a "freezing" sensation in her brain. She has an EEG scheduled for March 24th. She does report some head pain occasionally and headache. She does use nasal  sprays when her nasal cavity (fluticasone). She was on Claritin in the past. She has never been tested in the past. Symptoms persist throughout the year.   She does have a history of several right breast masses. She underwent a biopsy which was reassuring. She is also concerned about swelling around the right clavicle.   Otherwise, there is no history of other atopic diseases, including asthma, drug allergies, food allergies, or stinging insect allergies. There is no significant infectious history. Vaccinations are up to date.    Past Medical History: Patient Active Problem List   Diagnosis Date Noted  . Seasonal and perennial allergic rhinitis 12/15/2017  . Chronic urticaria 12/15/2017  . Mild persistent asthma, uncomplicated 26/37/8588  . Anxiety about health 02/17/2017  . Asthma 02/21/2016  . BMI 36.0-36.9,adult 02/21/2016  . Palpitation 12/22/2014  . Elevated troponin 12/21/2014  . Allergic rhinitis 03/06/2014  . Nipple discharge 06/21/2013  . Hypovitaminosis D 02/09/2013  . Alcohol abuse 02/09/2013    Medication List:  Allergies as of 12/15/2017   No Known Allergies  Medication List        Accurate as of 12/15/17 11:34 AM. Always use your most recent med list.          albuterol 108 (90 Base) MCG/ACT inhaler Commonly known as:  PROVENTIL HFA;VENTOLIN HFA Inhale 2 puffs into the lungs every 6 (six) hours as needed for wheezing or shortness of breath.   beclomethasone 40 MCG/ACT inhaler Commonly known as:  QVAR INHALE TWO PUFFS INTO THE LUNGS TWO TIMES DAILY   cetirizine 10 MG tablet Commonly known as:  ZYRTEC Take 10 mg by mouth daily.   Ciclopirox 1 % shampoo ciclopirox 1 % shampoo   Fluocinolone Acetonide Scalp 0.01 % Oil fluocinolone 0.01 % topical body oil   fluticasone 50 MCG/ACT nasal spray Commonly known as:  FLONASE Place 2 sprays into both nostrils daily.   ibuprofen 200 MG tablet Commonly known as:  ADVIL,MOTRIN Take 200 mg by mouth every 6  (six) hours as needed.   multivitamin with minerals Tabs tablet Take 1 tablet by mouth daily.   ranitidine 150 MG tablet Commonly known as:  ZANTAC Take 150 mg by mouth 2 (two) times daily.   vitamin C 1000 MG tablet Take 1,000 mg by mouth daily.       Birth History: non-contributory.   Developmental History: non-contributory.   Past Surgical History: Past Surgical History:  Procedure Laterality Date  . BREAST BIOPSY Right ~ 1993   "took out some benign tissue"     Family History: Family History  Problem Relation Age of Onset  . Heart disease Maternal Grandmother        9 bypass surgery  . Cancer Maternal Grandmother        mouth  . Sickle cell trait Father   . Seizures Father   . Alcohol abuse Father   . Thyroid disease Mother        as a child  . Hypertension Mother   . Anemia Mother   . Migraines Mother   . Hernia Mother   . Diabetes Mother   . Hypertension Paternal Grandmother   . Diabetes Paternal Grandmother   . Arthritis Maternal Aunt      Social History: Sherri Buckley lives at home by herself. She lives in a 44yo house that has carpeting in the main living areas and vinyl flooring in the bedrooms. They have gas heating and central cooling. There are no animals inside or outside of the home. There are no dust mite coverings on the bedding. There is no tobacco exposure. She works for [employer] and therefore is in extremely dusty conditions.      Review of Systems: a 14-point review of systems is pertinent for what is mentioned in HPI.  Otherwise, all other systems were negative. Constitutional: negative other than that listed in the HPI Eyes: negative other than that listed in the HPI Ears, nose, mouth, throat, and face: negative other than that listed in the HPI Respiratory: negative other than that listed in the HPI Cardiovascular: negative other than that listed in the HPI Gastrointestinal: negative other than that listed in the HPI Genitourinary:  negative other than that listed in the HPI Integument: negative other than that listed in the HPI Hematologic: negative other than that listed in the HPI Musculoskeletal: negative other than that listed in the HPI Neurological: negative other than that listed in the HPI Allergy/Immunologic: negative other than that listed in the HPI    Objective:   Blood pressure 130/78, pulse 92, temperature 98.7  F (37.1 C), temperature source Oral, resp. rate 18, height 5\' 1"  (1.549 m), weight 210 lb (95.3 kg), SpO2 98 %. Body mass index is 39.68 kg/m.   Physical Exam:  General: Alert, interactive, in no acute distress. Very talkative.  Eyes: No conjunctival injection bilaterally, no discharge on the right, no discharge on the left and no Horner-Trantas dots present. PERRL bilaterally. EOMI without pain. No photophobia.  Ears: Right TM pearly gray with normal light reflex, Left TM pearly gray with normal light reflex, Right TM intact without perforation and Left TM intact without perforation.  Nose/Throat: External nose within normal limits, nasal crease present and septum midline. Turbinates markedly edematous and pale with clear discharge. Posterior oropharynx erythematous without cobblestoning in the posterior oropharynx. Tonsils 2+ without exudates.  Tongue without thrush. Neck: Supple without thyromegaly. Trachea midline. Adenopathy: no enlarged lymph nodes appreciated in the anterior cervical, occipital, axillary, epitrochlear, inguinal, or popliteal regions. Lungs: Clear to auscultation without wheezing, rhonchi or rales. No increased work of breathing. CV: Normal S1/S2. No murmurs. Capillary refill <2 seconds.  Abdomen: Nondistended, nontender. No guarding or rebound tenderness. Bowel sounds faint and present in all fields  Skin: Urticarial lesions on the anterior chest. Marked dermatographism. Extremities:  No clubbing, cyanosis or edema. Neuro:   Grossly intact. No focal deficits  appreciated. Responsive to questions.  Diagnostic studies:  Spirometry: results normal (FEV1: 1.98/89%, FVC: 2.36/87%, FEV1/FVC: 84%).    Spirometry consistent with normal pattern.  Allergy Studies:   Indoor/Outdoor Percutaneous Adult Environmental Panel: positive to johnson grass, meadow fescue grass, perennial rye grass, lamb's quarters, sheep sorrel, ash, eastern cottonwood, hickory, maple, pecan pollen, pine, black walnut pollen, Aspergillus, Aureobasidium, Candida, Df mite, Dp mites, horse and cockroach. Otherwise negative with adequate controls.      Salvatore Marvel, MD [facility]

## 2017-12-15 NOTE — Patient Instructions (Addendum)
1. Mild persistent asthma, uncomplicated - Lung testing looks great today. - It seems that the Qvar is working very well for you.  - Daily controller medication(s): Qvar 32mcg Redihaler 1 puffs once daily - Prior to physical activity: ProAir 2 puffs 10-15 minutes before physical activity. - Rescue medications: ProAir 4 puffs every 4-6 hours as needed - Changes during respiratory infections or worsening symptoms: Increase Qvar 29mcg to 4 puffs twice daily for TWO WEEKS. - Asthma control goals:  * Full participation in all desired activities (may need albuterol before activity) * Albuterol use two time or less a week on average (not counting use with activity) * Cough interfering with sleep two time or less a month * Oral steroids no more than once a year * No hospitalizations  2. Chronic rhinitis - Testing today showed: trees, weeds, grasses, indoor molds, dust mites and cockroach - Avoidance measures provided. - Continue with: Zyrtec as below  - Start taking: Xhance (fluticasone) 1-2 sprays per nostril daily - You can use an extra dose of the antihistamine, if needed, for breakthrough symptoms.  - Consider nasal saline rinses 1-2 times daily to remove allergens from the nasal cavities as well as help with mucous clearance (this is especially helpful to do before the nasal sprays are given) - Consider allergy shots as a means of long-term control. - Allergy shots "re-train" and "reset" the immune system to ignore environmental allergens and decrease the resulting immune response to those allergens (sneezing, itchy watery eyes, runny nose, nasal congestion, etc).    - Allergy shots improve symptoms in 75-85% of patients.  - We can discuss more at the next appointment if the medications are not working for you.  3. Chronic urticaria - Your history does not have any "red flags" such as fevers, joint pains, or permanent skin changes that would be concerning for a more serious cause of hives.    - We will get some labs to rule out serious causes of hives: tryptase level, CMP anti-thyroid antibodies. - We will call you in 1-2 weeks with the results.  - Chronic hives are often times a self limited process and will "burn themselves out" over 6-12 months, although this is not always the case.  - In the meantime, start suppressive dosing of antihistamines:   - Morning: Zyrtec (cetirizine) 20mg  (two tablets)  - Evening: Zyrtec (cetirizine) 20mg  (two tablets) + Zantac (ranitidine) 75mg  - 150mg  (one to two tablets) - You can change this dosing at home, decreasing the dose as needed or increasing the dosing as needed.  - If you are not tolerating the medications or are tired of taking them every day, we can start treatment with a monthly injectable medication called Xolair.   4. Return in about 2 months (around 02/14/2018).  Please inform us of any Emergency Department visits, hospitalizations, or changes in symptoms. Call us before going to the ED for breathing or allergy symptoms since we might be able to fit you in for a sick visit. Feel free to contact us anytime with any questions, problems, or concerns.  It was a pleasure to meet you today!  Websites that have reliable patient information: 1. American Academy of Asthma, Allergy, and Immunology: www.aaaai.org 2. Food Allergy Research and Education (FARE): foodallergy.org 3. Mothers of Asthmatics: http://www.asthmacommunitynetwork.org 4. American College of Allergy, Asthma, and Immunology: www.acaai.org    Reducing Pollen Exposure  The American Academy of Allergy, Asthma and Immunology suggests the following steps to reduce your exposure to  pollen during allergy seasons.    1. Do not hang sheets or clothing out to dry; pollen may collect on these items. 2. Do not mow lawns or spend time around freshly cut grass; mowing stirs up pollen. 3. Keep windows closed at night.  Keep car windows closed while driving. 4. Minimize morning  activities outdoors, a time when pollen counts are usually at their highest. 5. Stay indoors as much as possible when pollen counts or humidity is high and on windy days when pollen tends to remain in the air longer. 6. Use air conditioning when possible.  Many air conditioners have filters that trap the pollen spores. 7. Use a HEPA room air filter to remove pollen form the indoor air you breathe.  Control of Mold Allergen   Mold and fungi can grow on a variety of surfaces provided certain temperature and moisture conditions exist.  Outdoor molds grow on plants, decaying vegetation and soil.  The major outdoor mold, Alternaria and Cladosporium, are found in very high numbers during hot and dry conditions.  Generally, a late Summer - Fall peak is seen for common outdoor fungal spores.  Rain will temporarily lower outdoor mold spore count, but counts rise rapidly when the rainy period ends.  The most important indoor molds are Aspergillus and Penicillium.  Dark, humid and poorly ventilated basements are ideal sites for mold growth.  The next most common sites of mold growth are the bathroom and the kitchen.   Indoor (Perennial) Mold Control   Positive indoor molds via skin testing: Aspergillus, Aureobasidium ([place]) and Candida  1. Maintain humidity below 50%. 2. Clean washable surfaces with 5% bleach solution. 3. Remove sources e.g. contaminated carpets.   Control of House Dust Mite Allergen    House dust mites play a major role in allergic asthma and rhinitis.  They occur in environments with high humidity wherever human skin, the food for dust mites is found. High levels have been detected in dust obtained from mattresses, pillows, carpets, upholstered furniture, bed covers, clothes and soft toys.  The principal allergen of the house dust mite is found in its feces.  A gram of dust may contain 1,000 mites and 250,000 fecal particles.  Mite antigen is easily measured in the air during  house cleaning activities.    1. Encase mattresses, including the box spring, and pillow, in an air tight cover.  Seal the zipper end of the encased mattresses with wide adhesive tape. 2. Wash the bedding in water of 130 degrees Farenheit weekly.  Avoid cotton comforters/quilts and flannel bedding: the most ideal bed covering is the dacron comforter. 3. Remove all upholstered furniture from the bedroom. 4. Remove carpets, carpet padding, rugs, and non-washable window drapes from the bedroom.  Wash drapes weekly or use plastic window coverings. 5. Remove all non-washable stuffed toys from the bedroom.  Wash stuffed toys weekly. 6. Have the room cleaned frequently with a vacuum cleaner and a damp dust-mop.  The patient should not be in a room which is being cleaned and should wait 1 hour after cleaning before going into the room. 7. Close and seal all heating outlets in the bedroom.  Otherwise, the room will become filled with dust-laden air.  An electric heater can be used to heat the room. 8. Reduce indoor humidity to less than 50%.  Do not use a humidifier.  Control of Cockroach Allergen  Cockroach allergen has been identified as an important cause of acute attacks of asthma, especially in  urban settings.  There are fifty-five species of cockroach that exist in the [place], however only three, the [place], [place] species produce allergen that can affect patients with Asthma.  Allergens can be obtained from fecal particles, egg casings and secretions from cockroaches.    1. Remove food sources. 2. Reduce access to water. 3. Seal access and entry points. 4. Spray runways with 0.5-1% Diazinon or Chlorpyrifos 5. Blow boric acid power under stoves and refrigerator. 6. Place bait stations (hydramethylnon) at feeding sites.

## 2017-12-17 LAB — CMP14+EGFR
A/G RATIO: 1.2 (ref 1.2–2.2)
ALT: 21 IU/L (ref 0–32)
AST: 21 IU/L (ref 0–40)
Albumin: 3.9 g/dL (ref 3.5–5.5)
Alkaline Phosphatase: 49 IU/L (ref 39–117)
BUN/Creatinine Ratio: 14 (ref 9–23)
BUN: 11 mg/dL (ref 6–24)
Bilirubin Total: 0.6 mg/dL (ref 0.0–1.2)
CALCIUM: 9 mg/dL (ref 8.7–10.2)
CO2: 20 mmol/L (ref 20–29)
CREATININE: 0.76 mg/dL (ref 0.57–1.00)
Chloride: 105 mmol/L (ref 96–106)
GFR calc Af Amer: 111 mL/min/{1.73_m2} (ref >59)
GFR, EST NON AFRICAN AMERICAN: 96 mL/min/{1.73_m2} (ref >59)
GLOBULIN, TOTAL: 3.2 g/dL (ref 1.5–4.5)
Glucose: 95 mg/dL (ref 65–99)
POTASSIUM: 4.1 mmol/L (ref 3.5–5.2)
Sodium: 140 mmol/L (ref 134–144)
TOTAL PROTEIN: 7.1 g/dL (ref 6.0–8.5)

## 2017-12-17 LAB — THYROID ANTIBODIES
THYROGLOBULIN ANTIBODY: 1 [IU]/mL — AB (ref 0.0–0.9)
Thyroperoxidase Ab SerPl-aCnc: 30 IU/mL (ref 0–34)

## 2017-12-17 LAB — TRYPTASE: Tryptase: 2.2 ug/L (ref 2.2–13.2)

## 2017-12-22 ENCOUNTER — Ambulatory Visit
Admission: RE | Admit: 2017-12-22 | Discharge: 2017-12-22 | Disposition: A | Discharge status: Home / Self Care | Payer: Federal, State, Local not specified - PPO | Source: Ambulatory Visit | Attending: Family Medicine | Admitting: Family Medicine

## 2017-12-22 DIAGNOSIS — Z1231 Encounter for screening mammogram for malignant neoplasm of breast: Secondary | ICD-10-CM | POA: Diagnosis not present

## 2017-12-28 DIAGNOSIS — R569 Unspecified convulsions: Secondary | ICD-10-CM | POA: Diagnosis not present

## 2018-01-03 ENCOUNTER — Other Ambulatory Visit: Payer: Self-pay

## 2018-01-03 ENCOUNTER — Ambulatory Visit: Payer: Federal, State, Local not specified - PPO | Admitting: Physician Assistant

## 2018-01-03 ENCOUNTER — Ambulatory Visit (INDEPENDENT_AMBULATORY_CARE_PROVIDER_SITE_OTHER): Payer: Federal, State, Local not specified - PPO

## 2018-01-03 ENCOUNTER — Encounter: Payer: Self-pay | Admitting: Physician Assistant

## 2018-01-03 VITALS — BP 114/76 | HR 89 | Temp 98.3°F | Resp 16 | Ht 61.0 in | Wt 209.0 lb

## 2018-01-03 DIAGNOSIS — J029 Acute pharyngitis, unspecified: Secondary | ICD-10-CM

## 2018-01-03 DIAGNOSIS — R059 Cough, unspecified: Secondary | ICD-10-CM

## 2018-01-03 DIAGNOSIS — J069 Acute upper respiratory infection, unspecified: Secondary | ICD-10-CM | POA: Diagnosis not present

## 2018-01-03 DIAGNOSIS — R05 Cough: Secondary | ICD-10-CM

## 2018-01-03 DIAGNOSIS — J4531 Mild persistent asthma with (acute) exacerbation: Secondary | ICD-10-CM | POA: Diagnosis not present

## 2018-01-03 LAB — POCT INFLUENZA A/B
INFLUENZA A, POC: NEGATIVE
Influenza B, POC: NEGATIVE

## 2018-01-03 LAB — POCT RAPID STREP A (OFFICE): Rapid Strep A Screen: NEGATIVE

## 2018-01-03 MED ORDER — PREDNISONE 20 MG PO TABS: 40.0000 mg | ORAL_TABLET | Freq: Every day | ORAL | 0 refills | Status: AC

## 2018-01-03 MED ORDER — BENZONATATE 100 MG PO CAPS: 100.0000 mg | ORAL_CAPSULE | Freq: Three times a day (TID) | ORAL | 0 refills | Status: DC | PRN

## 2018-01-03 NOTE — Progress Notes (Signed)
MRN: [id number] DOB: 03/30/1976  Subjective:   Sherri Buckley is a 42 y.o. female presenting for chief complaint of Cough (productive and diarrhea x 2 days) and Sore Throat .  Reports 2 day history of sore throat, rhinorrhea, and productive cough. Has associated sneezing.  Has tried robitussin cough syrup and allergy medication with some relief. Has had some diarrhea (1-2 episodes), improving.  Cough is worsening. Not keeping her up at night. Denies fever, sinus pain, ear pain, wheezing, shortness of breath, chest pain and body aches, nausea, vomiting and abdominal pain. Has had sick contact with coworkers. Has history of seasonal allergies, takes zyrtec and zantac daily. Has history of asthma, uses qvar daily and albuterol prn. Has not had to increase use of albuterol this illness. Patient has not had flu shot this season. Former smoker. Denies any other aggravating or relieving factors, no other questions or concerns.  Sherri Buckley has a current medication list which includes the following prescription(s): albuterol, vitamin c, beclomethasone, cetirizine, ciclopirox, fluocinolone acetonide scalp, ibuprofen, multivitamin with minerals, ranitidine, fluticasone, and fluticasone propionate, and the following Facility-Administered Medications: albuterol. Also has No Known Allergies.  Sherri Buckley  has a past medical history of Allergy, Asthma, Blood type, Rh negative, Chronic bronchitis ([place]), Depression (10/07/1998), Headache(784.0), Osteoarthritis of ankle, right, Seasonal allergies, Substance abuse ([place]), and Urticaria. Also  has a past surgical history that includes Breast biopsy (Right, ~ 1993).   Objective:   Vitals: BP 114/76 (BP Location: Right Arm)   Pulse 89   Temp 98.3 F (36.8 C) (Oral)   Resp 16   Ht 5\' 1"  (1.549 m)   Wt 209 lb (94.8 kg)   LMP 12/23/2017   SpO2 98%   BMI 39.49 kg/m   Physical Exam  Constitutional: She is oriented to person, place, and time. She appears  well-developed and well-nourished. No distress.  HENT:  Head: Normocephalic and atraumatic.  Nose: Mucosal edema (bilateral) and rhinorrhea present. Right sinus exhibits no maxillary sinus tenderness and no frontal sinus tenderness. Left sinus exhibits no maxillary sinus tenderness and no frontal sinus tenderness.  Mouth/Throat: Uvula is midline and mucous membranes are normal. Posterior oropharyngeal erythema present. No posterior oropharyngeal edema or tonsillar abscesses. Tonsils are 2+ on the right. Tonsils are 2+ on the left. No tonsillar exudate.  Eyes: Conjunctivae are normal.  Neck: Normal range of motion.  Cardiovascular: Normal rate, regular rhythm and normal heart sounds.  Pulmonary/Chest: Effort normal. No accessory muscle usage. No tachypnea. No respiratory distress. She has no decreased breath sounds. She has wheezes (few faint wheezes in right lower lung field ). She has rhonchi (scattered in posterior right lung field). She has no rales.  Lymphadenopathy:       Head (right side): No submental, no submandibular, no tonsillar, no preauricular, no posterior auricular and no occipital adenopathy present.       Head (left side): No submental, no submandibular, no tonsillar, no preauricular, no posterior auricular and no occipital adenopathy present.    She has no cervical adenopathy.       Right: No supraclavicular adenopathy present.       Left: No supraclavicular adenopathy present.  Neurological: She is alert and oriented to person, place, and time.  Skin: Skin is warm and dry.  Psychiatric: She has a normal mood and affect.  Vitals reviewed.   Results for orders placed or performed in visit on 01/03/18 (from the past 24 hour(s))  POCT rapid strep A  Status: None   Collection Time: 01/03/18 11:53 AM  Result Value Ref Range   Rapid Strep A Screen Negative Negative  POCT Influenza A/B     Status: None   Collection Time: 01/03/18 11:58 AM  Result Value Ref Range   Influenza  A, POC Negative Negative   Influenza B, POC Negative Negative   Dg Chest 2 View  Result Date: 01/03/2018 CLINICAL DATA:  Productive cough for 2 days. EXAM: CHEST - 2 VIEW COMPARISON:  December 12, 2015 FINDINGS: The heart size and mediastinal contours are within normal limits. Both lungs are clear. The visualized skeletal structures are unremarkable. IMPRESSION: No active cardiopulmonary disease. Electronically Signed   By: Dorise Bullion III M.D   On: 01/03/2018 12:27   Peak flow reading is 320, about 83 % of predicted.  Assessment and Plan :  1. Mild persistent asthma with exacerbation History and physical exam findings consistent with viral URI mildly exacerbating asthma. She is overall well-appearing, no distress.  Vital stable.  There are few faint wheezes noted on exam.  Patient does not have shortness of breath or chest tightness.  Peak flow is 83% of predicted, which is reassuring.  CXR normal. POC flu and strep test negative. Recommend short course of prednisone.  Albuterol inhaler every 4-6 hours as needed.  Continue with daily maintenance inhaler as prescribed.  Given strict return/ED precautions. - predniSONE (DELTASONE) 20 MG tablet; Take 2 tablets (40 mg total) by mouth daily with breakfast for 5 days.  Dispense: 10 tablet; Refill: 0  2. Acute upper respiratory infection - benzonatate (TESSALON) 100 MG capsule; Take 1-2 capsules (100-200 mg total) by mouth 3 (three) times daily as needed for cough.  Dispense: 40 capsule; Refill: 0  3. Sorethroat - POCT Influenza A/B - POCT rapid strep A - Culture, Group A Strep  4. Cough - POCT Influenza A/B - DG Chest 2 View; Future - Care order/instruction   Tenna Delaine, PA-C  [facility] 01/03/2018 12:34 PM

## 2018-01-03 NOTE — Patient Instructions (Addendum)
- We will treat this as a respiratory viral infection. We are going to treat your underlying inflammation with oral prednisone. Prednisone is a steroid and can cause side effects such as headache, irritability, nausea, vomiting, increased heart rate, increased blood pressure, increased blood sugar, appetite changes, and insomnia. Please take tablets in the morning with a full meal to help decrease the chances of these side effects.  - Use albuterol inhaler every 4-6 hours as needed for shortness of breath or wheezing.  - I recommend you rest, drink plenty of fluids, eat light meals including soups.  - You may use tessalon perlese for cough.  - You may also use Tylenol or ibuprofen over-the-counter for your sore throat. Tea recipe for sore throat: boil water, add 2 inches shaved ginger root, steep 15 minutes, add juice from 2 full lemons, and 2 tbsp honey. - Please let me know if you are not seeing any improvement in 5-7 days or if your symptoms worsen or you develop new concerning symptoms.     Upper Respiratory Infection, Adult Most upper respiratory infections (URIs) are caused by a virus. A URI affects the nose, throat, and upper air passages. The most common type of URI is often called "the common cold." Follow these instructions at home:  Take medicines only as told by your doctor.  Gargle warm saltwater or take cough drops to comfort your throat as told by your doctor.  Use a warm mist humidifier or inhale steam from a shower to increase air moisture. This may make it easier to breathe.  Drink enough fluid to keep your pee (urine) clear or pale yellow.  Eat soups and other clear broths.  Have a healthy diet.  Rest as needed.  Go back to work when your fever is gone or your doctor says it is okay. ? You may need to stay home longer to avoid giving your URI to others. ? You can also wear a face mask and wash your hands often to prevent spread of the virus.  Use your inhaler more  if you have asthma.  Do not use any tobacco products, including cigarettes, chewing tobacco, or electronic cigarettes. If you need help quitting, ask your doctor. Contact a doctor if:  You are getting worse, not better.  Your symptoms are not helped by medicine.  You have chills.  You are getting more short of breath.  You have brown or red mucus.  You have yellow or brown discharge from your nose.  You have pain in your face, especially when you bend forward.  You have a fever.  You have puffy (swollen) neck glands.  You have pain while swallowing.  You have white areas in the back of your throat. Get help right away if:  You have very bad or constant: ? Headache. ? Ear pain. ? Pain in your forehead, behind your eyes, and over your cheekbones (sinus pain). ? Chest pain.  You have long-lasting (chronic) lung disease and any of the following: ? Wheezing. ? Long-lasting cough. ? Coughing up blood. ? A change in your usual mucus.  You have a stiff neck.  You have changes in your: ? Vision. ? Hearing. ? Thinking. ? Mood. This information is not intended to replace advice given to you by your health care provider. Make sure you discuss any questions you have with your health care provider. Document Released: 03/11/2008 Document Revised: 05/26/2016 Document Reviewed: 12/29/2013 Elsevier Interactive Patient Education  2018 [employer].   Asthma,  Acute Bronchospasm Acute bronchospasm caused by asthma is also referred to as an asthma attack. Bronchospasm means your air passages become narrowed. The narrowing is caused by inflammation and tightening of the muscles in the air tubes (bronchi) in your lungs. This can make it hard to breathe or cause you to wheeze and cough. What are the causes? Possible triggers are:  Animal dander from the skin, hair, or feathers of animals.  Dust mites contained in house dust.  Cockroaches.  Pollen from trees or  grass.  Mold.  Cigarette or tobacco smoke.  Air pollutants such as dust, household cleaners, hair sprays, aerosol sprays, paint fumes, strong chemicals, or strong odors.  Cold air or weather changes. Cold air may trigger inflammation. Winds increase molds and pollens in the air.  Strong emotions such as crying or laughing hard.  Stress.  Certain medicines such as aspirin or beta-blockers.  Sulfites in foods and drinks, such as dried fruits and wine.  Infections or inflammatory conditions, such as a flu, cold, or inflammation of the nasal membranes (rhinitis).  Gastroesophageal reflux disease (GERD). GERD is a condition where stomach acid backs up into your esophagus.  Exercise or strenuous activity.  What are the signs or symptoms?  Wheezing.  Excessive coughing, particularly at night.  Chest tightness.  Shortness of breath. How is this diagnosed? Your health care provider will ask you about your medical history and perform a physical exam. A chest X-ray or blood testing may be performed to look for other causes of your symptoms or other conditions that may have triggered your asthma attack. How is this treated? Treatment is aimed at reducing inflammation and opening up the airways in your lungs. Most asthma attacks are treated with inhaled medicines. These include quick relief or rescue medicines (such as bronchodilators) and controller medicines (such as inhaled corticosteroids). These medicines are sometimes given through an inhaler or a nebulizer. Systemic steroid medicine taken by mouth or given through an IV tube also can be used to reduce the inflammation when an attack is moderate or severe. Antibiotic medicines are only used if a bacterial infection is present. Follow these instructions at home:  Rest.  Drink plenty of liquids. This helps the mucus to remain thin and be easily coughed up. Only use caffeine in moderation and do not use alcohol until you have recovered  from your illness.  Do not smoke. Avoid being exposed to secondhand smoke.  You play a critical role in keeping yourself in good health. Avoid exposure to things that cause you to wheeze or to have breathing problems.  Keep your medicines up-to-date and available. Carefully follow your health care provider's treatment plan.  Take your medicine exactly as prescribed.  When pollen or pollution is bad, keep windows closed and use an air conditioner or go to places with air conditioning.  Asthma requires careful medical care. See your health care provider for a follow-up as advised. If you are more than [redacted] weeks pregnant and you were prescribed any new medicines, let your obstetrician know about the visit and how you are doing. Follow up with your health care provider as directed.  After you have recovered from your asthma attack, make an appointment with your outpatient doctor to talk about ways to reduce the likelihood of future attacks. If you do not have a doctor who manages your asthma, make an appointment with a primary care doctor to discuss your asthma. Get help right away if:  You are getting worse.  You have trouble breathing. If severe, call your local emergency services (911 in the [place]  You develop chest pain or discomfort.  You are vomiting.  You are not able to keep fluids down.  You are coughing up yellow, green, brown, or bloody sputum.  You have a fever and your symptoms suddenly get worse.  You have trouble swallowing. This information is not intended to replace advice given to you by your health care provider. Make sure you discuss any questions you have with your health care provider. Document Released: 01/08/2007 Document Revised: 03/06/2016 Document Reviewed: 03/31/2013 Elsevier Interactive Patient Education  2017 [employer].    IF you received an x-ray today, you will receive an invoice from [facility]. Please contact [facility] at  [phone] with questions or concerns regarding your invoice.   IF you received labwork today, you will receive an invoice from [facility]. Please contact [facility] at [phone] with questions or concerns regarding your invoice.   Our billing staff will not be able to assist you with questions regarding bills from these companies.  You will be contacted with the lab results as soon as they are available. The fastest way to get your results is to activate your My Chart account. Instructions are located on the last page of this paperwork. If you have not heard from [place] regarding the results in 2 weeks, please contact this office.

## 2018-01-05 ENCOUNTER — Ambulatory Visit: Payer: Federal, State, Local not specified - PPO | Admitting: Physician Assistant

## 2018-01-06 LAB — CULTURE, GROUP A STREP: Strep A Culture: NEGATIVE

## 2018-01-14 ENCOUNTER — Encounter: Payer: Self-pay | Admitting: Physician Assistant

## 2018-02-02 DIAGNOSIS — Z6841 Body Mass Index (BMI) 40.0 and over, adult: Secondary | ICD-10-CM | POA: Diagnosis not present

## 2018-02-02 DIAGNOSIS — R87610 Atypical squamous cells of undetermined significance on cytologic smear of cervix (ASC-US): Secondary | ICD-10-CM | POA: Diagnosis not present

## 2018-02-02 DIAGNOSIS — Z01419 Encounter for gynecological examination (general) (routine) without abnormal findings: Secondary | ICD-10-CM | POA: Diagnosis not present

## 2018-02-02 DIAGNOSIS — Z124 Encounter for screening for malignant neoplasm of cervix: Secondary | ICD-10-CM | POA: Diagnosis not present

## 2018-02-02 LAB — HM PAP SMEAR: HM Pap smear: NEGATIVE

## 2018-02-09 DIAGNOSIS — R202 Paresthesia of skin: Secondary | ICD-10-CM | POA: Diagnosis not present

## 2018-02-16 ENCOUNTER — Other Ambulatory Visit: Payer: Self-pay | Admitting: Physician Assistant

## 2018-02-16 DIAGNOSIS — R609 Edema, unspecified: Secondary | ICD-10-CM

## 2018-02-20 ENCOUNTER — Other Ambulatory Visit: Payer: Self-pay | Admitting: Obstetrics and Gynecology

## 2018-02-20 DIAGNOSIS — R87619 Unspecified abnormal cytological findings in specimens from cervix uteri: Secondary | ICD-10-CM | POA: Diagnosis not present

## 2018-02-20 DIAGNOSIS — N888 Other specified noninflammatory disorders of cervix uteri: Secondary | ICD-10-CM | POA: Diagnosis not present

## 2018-02-23 ENCOUNTER — Ambulatory Visit: Payer: Federal, State, Local not specified - PPO | Admitting: Allergy & Immunology

## 2018-02-26 ENCOUNTER — Encounter: Payer: Self-pay | Admitting: Family Medicine

## 2018-06-22 DIAGNOSIS — M19071 Primary osteoarthritis, right ankle and foot: Secondary | ICD-10-CM | POA: Diagnosis not present

## 2018-08-17 ENCOUNTER — Encounter

## 2018-08-17 ENCOUNTER — Ambulatory Visit (INDEPENDENT_AMBULATORY_CARE_PROVIDER_SITE_OTHER): Payer: Federal, State, Local not specified - PPO | Admitting: Physician Assistant

## 2018-08-17 ENCOUNTER — Encounter: Payer: Self-pay | Admitting: Physician Assistant

## 2018-08-17 ENCOUNTER — Other Ambulatory Visit: Payer: Self-pay

## 2018-08-17 VITALS — BP 126/84 | HR 79 | Temp 98.4°F | Resp 20 | Ht 61.69 in | Wt 211.0 lb

## 2018-08-17 DIAGNOSIS — Z13220 Encounter for screening for lipoid disorders: Secondary | ICD-10-CM | POA: Diagnosis not present

## 2018-08-17 DIAGNOSIS — Z0000 Encounter for general adult medical examination without abnormal findings: Secondary | ICD-10-CM | POA: Diagnosis not present

## 2018-08-17 DIAGNOSIS — Z13228 Encounter for screening for other metabolic disorders: Secondary | ICD-10-CM

## 2018-08-17 DIAGNOSIS — Z1329 Encounter for screening for other suspected endocrine disorder: Secondary | ICD-10-CM | POA: Diagnosis not present

## 2018-08-17 DIAGNOSIS — Z87898 Personal history of other specified conditions: Secondary | ICD-10-CM | POA: Insufficient documentation

## 2018-08-17 DIAGNOSIS — Z130 Encounter for screening for diseases of the blood and blood-forming organs and certain disorders involving the immune mechanism: Secondary | ICD-10-CM | POA: Diagnosis not present

## 2018-08-17 DIAGNOSIS — E6609 Other obesity due to excess calories: Secondary | ICD-10-CM

## 2018-08-17 DIAGNOSIS — N938 Other specified abnormal uterine and vaginal bleeding: Secondary | ICD-10-CM | POA: Insufficient documentation

## 2018-08-17 DIAGNOSIS — Z6838 Body mass index (BMI) 38.0-38.9, adult: Secondary | ICD-10-CM

## 2018-08-17 DIAGNOSIS — N921 Excessive and frequent menstruation with irregular cycle: Secondary | ICD-10-CM | POA: Insufficient documentation

## 2018-08-17 MED ORDER — CETIRIZINE HCL 10 MG PO TABS: 10.0000 mg | ORAL_TABLET | Freq: Every day | ORAL | 3 refills | Status: AC

## 2018-08-17 MED ORDER — FLUTICASONE PROPIONATE 50 MCG/ACT NA SUSP: 2.0000 | Freq: Every day | NASAL | 6 refills | Status: AC

## 2018-08-17 MED ORDER — ALBUTEROL SULFATE HFA 108 (90 BASE) MCG/ACT IN AERS: 2.0000 | INHALATION_SPRAY | Freq: Four times a day (QID) | RESPIRATORY_TRACT | 1 refills | Status: DC | PRN

## 2018-08-17 MED ORDER — BECLOMETHASONE DIPROP HFA 40 MCG/ACT IN AERB: INHALATION_SPRAY | RESPIRATORY_TRACT | 4 refills | Status: DC

## 2018-08-17 NOTE — Progress Notes (Signed)
Sherri Buckley  MRN: [id number] DOB: 1976-02-17  Subjective:  Pt is a 42 y.o. female who presents for annual physical exam. Pt is fasting today.   Primary Preventative Screenings: Cervical Cancer: 01/2018 with gynecology, was abnormal, had bx, which was normal Family Planning: 0 children, not planning on having any at this time; no current boyfriend. Cycles are regular at this time. Currently on menstrual cycle.  STI screening: Not currently sexually active, has been at least 8 years.  Breast Cancer: mammogram 2019, no FH of breast cancer Colorectal Cancer:  no FH of colon cancer  Weight/Blood sugar/Diet/Exercise: No structured exercise. Eating "everything I shouldn't be eating: chinese food, mcdonalds, [place], chick fila." OTC/Vit/Supp/Herbal: OTC multivitamin. Dentist/Optho: Recent dentist appointment, brushes BID. Eye exam annually.  Immunizations: Tdap: 2016 Flu: never  Having BM daily.   Needs refill for asthma inhalers. Uses qvar twice daily and albuterol prn. Asthma feels well controlled at this time.   Patient Active Problem List   Diagnosis Date Noted  . Dysfunctional uterine bleeding 08/17/2018  . Irregular intermenstrual bleeding 08/17/2018  . History of substance use 08/17/2018  . Seasonal and perennial allergic rhinitis 12/15/2017  . Chronic urticaria 12/15/2017  . Mild persistent asthma, uncomplicated 21/97/5883  . Anxiety about health 02/17/2017  . Asthma 02/21/2016  . BMI 36.0-36.9,adult 02/21/2016  . Palpitation 12/22/2014  . Elevated troponin 12/21/2014  . Allergic rhinitis 03/06/2014  . Nipple discharge 06/21/2013  . Hypovitaminosis D 02/09/2013  . Alcohol abuse 02/09/2013    Current Outpatient Medications on File Prior to Visit  Medication Sig Dispense Refill  . Ciclopirox 1 % shampoo ciclopirox 1 % shampoo    . Fluocinolone Acetonide 0.01 % OIL fluocinolone 0.01 % topical body oil    . Fluocinolone Acetonide Scalp 0.01 % OIL fluocinolone  0.01 % topical body oil    . Ibuprofen (ADVIL) 200 MG CAPS Advil  prn    . Multiple Vitamins-Minerals (MULTIVITAMIN ADULT PO) multivitamin     Current Facility-Administered Medications on File Prior to Visit  Medication Dose Route Frequency Provider Last Rate Last Dose  . albuterol (PROVENTIL) (2.5 MG/3ML) 0.083% nebulizer solution 2.5 mg  2.5 mg Nebulization Once Wardell Honour, MD        No Known Allergies  Social History   Socioeconomic History  . Marital status: Single    Spouse name: Not on file  . Number of children: 0  . Years of education: Not on file  . Highest education level: Not on file  Occupational History  . Occupation: employed    Fish farm manager: [employer]    Comment: Teacher, early years/pre  Social Needs  . Financial resource strain: Not hard at all  . Food insecurity:    Worry: Never true    Inability: Never true  . Transportation needs:    Medical: No    Non-medical: No  Tobacco Use  . Smoking status: Former Research scientist (life sciences)  . Smokeless tobacco: Never Used  . Tobacco comment: black and milds  Substance and Sexual Activity  . Alcohol use: Yes    Alcohol/week: 8.0 standard drinks    Types: 8 Shots of liquor per week  . Drug use: Not Currently    Types: Marijuana    Comment: "quit smoking marijuana in 2013"  . Sexual activity: Not Currently  Lifestyle  . Physical activity:    Days per week: 0 days    Minutes per session: 0 min  . Stress: Not on file  Relationships  .  Social connections:    Talks on phone: More than three times a week    Gets together: Never    Attends religious service: More than 4 times per year    Active member of club or organization: Yes    Attends meetings of clubs or organizations: More than 4 times per year    Relationship status: Never married  Other Topics Concern  . Not on file  Social History Narrative   Marital status: single; not dating      Children:  None      Lives: alone in house.      Employment: Actor x  11 years; clerk processing; happy; 3rd shift.   Works a lot of overtime.        Tobacco:  Cigars rarely in 2018.      Alcohol: drinks beer 5 per week;  previous alcoholism; stopped drinking heavily 2014; previous counseling; went through EAP with work.      Drugs:  None; h/o marijuana; rare use now.      Exercise:  None      Seatbelt:  100%      Guns:  None      Sexual activity:  Total partners = 25; history of crabs; dates males only.  Last STD screening 4 years ago.      Past Surgical History:  Procedure Laterality Date  . BREAST BIOPSY Right ~ 1993   "took out some benign tissue"    Family History  Problem Relation Age of Onset  . Heart disease Maternal Grandmother        9 bypass surgery  . Cancer Maternal Grandmother        mouth  . Sickle cell trait Father   . Seizures Father   . Alcohol abuse Father   . Thyroid disease Mother        as a child  . Hypertension Mother   . Anemia Mother   . Migraines Mother   . Hernia Mother   . Diabetes Mother   . Hypertension Paternal Grandmother   . Diabetes Paternal Grandmother   . Arthritis Maternal Aunt     Review of Systems  Constitutional: Negative for activity change, appetite change, chills, diaphoresis, fatigue, fever and unexpected weight change.  HENT: Negative for congestion, dental problem, drooling, ear discharge, ear pain, facial swelling, hearing loss, mouth sores, nosebleeds, postnasal drip, rhinorrhea, sinus pressure, sinus pain, sneezing, sore throat, tinnitus, trouble swallowing and voice change.   Eyes: Negative for photophobia, pain, discharge, redness, itching and visual disturbance.  Respiratory: Negative for apnea, cough, choking, chest tightness, shortness of breath, wheezing and stridor.   Cardiovascular: Negative for chest pain, palpitations and leg swelling.  Gastrointestinal: Negative for abdominal distention, abdominal pain, anal bleeding, blood in stool, constipation, diarrhea, nausea, rectal pain and  vomiting.  Endocrine: Negative for cold intolerance, heat intolerance, polydipsia, polyphagia and polyuria.       + hair thinning for years, followed by derm   Genitourinary: Negative for decreased urine volume, difficulty urinating, dyspareunia, dysuria, enuresis, flank pain, frequency, genital sores, hematuria, menstrual problem, pelvic pain, urgency, vaginal bleeding, vaginal discharge and vaginal pain.  Musculoskeletal: Negative for arthralgias, back pain, gait problem, joint swelling, myalgias, neck pain and neck stiffness.       +foot pain, has appointment with podiatrist in one week   Skin: Negative for color change, pallor, rash and wound.  Allergic/Immunologic: Negative for environmental allergies, food allergies and immunocompromised state.  Neurological: Negative for dizziness,  tremors, seizures, syncope, facial asymmetry, speech difficulty, weakness, light-headedness, numbness and headaches.  Hematological: Negative for adenopathy. Does not bruise/bleed easily.  Psychiatric/Behavioral: Negative for agitation, behavioral problems, confusion, decreased concentration, dysphoric mood, hallucinations, self-injury, sleep disturbance and suicidal ideas. The patient is not nervous/anxious and is not hyperactive.     Objective:  BP 126/84   Pulse 79   Temp 98.4 F (36.9 C) (Oral)   Resp 20   Ht 5' 1.69" (1.567 m)   Wt 211 lb (95.7 kg)   LMP 08/13/2018 (Approximate)   SpO2 100%   BMI 38.98 kg/m   Physical Exam  Constitutional: She is oriented to person, place, and time. She appears well-developed and well-nourished. No distress.  HENT:  Head: Normocephalic and atraumatic.  Right Ear: Hearing, tympanic membrane, external ear and ear canal normal.  Left Ear: Hearing, tympanic membrane, external ear and ear canal normal.  Nose: Nose normal.  Mouth/Throat: Uvula is midline, oropharynx is clear and moist and mucous membranes are normal. No oropharyngeal exudate.  Eyes: Pupils are  equal, round, and reactive to light. Conjunctivae, EOM and lids are normal. No scleral icterus.  Neck: Trachea normal and normal range of motion. No thyroid mass and no thyromegaly present.  Cardiovascular: Normal rate, regular rhythm, normal heart sounds and intact distal pulses.  Pulmonary/Chest: Effort normal and breath sounds normal. She has no wheezes. She has no rhonchi. She has no rales.  Abdominal: Soft. Normal appearance and bowel sounds are normal. There is no tenderness.  Lymphadenopathy:       Head (right side): No tonsillar, no preauricular, no posterior auricular and no occipital adenopathy present.       Head (left side): No tonsillar, no preauricular, no posterior auricular and no occipital adenopathy present.    She has no cervical adenopathy.       Right: No supraclavicular adenopathy present.       Left: No supraclavicular adenopathy present.  Neurological: She is alert and oriented to person, place, and time. She has normal strength and normal reflexes.  Skin: Skin is warm and dry.    Visual Acuity Screening   Right eye Left eye Both eyes  Without correction: _0  With correction:      Wt Readings from Last 3 Encounters:  08/17/18 211 lb (95.7 kg)  01/03/18 209 lb (94.8 kg)  12/15/17 210 lb (95.3 kg)    Assessment and Plan :  Discussed healthy lifestyle, diet, exercise, preventative care, vaccinations, and addressed patient's concerns. Plan for follow up in one year. Otherwise, plan for specific conditions below.  1. Routine physical examination Await lab results. Declines flu vaccine.   2. Class 2 obesity due to excess calories without serious comorbidity with body mass index (BMI) of 38.0 to 38.9 in adult -Discussed lifestyle modifications.   3. Screening, lipid - Lipid panel  4. Screening for deficiency anemia - CBC with Differential/Platelet  5. Screening for metabolic disorder - [id number]  6. Screening for thyroid disorder -  TSH   Tenna Delaine, PA-C  [facility] 08/17/2018 2:15 PM

## 2018-08-17 NOTE — Patient Instructions (Addendum)
 If you have lab work done today you will be contacted with your lab results within the next 2 weeks.  If you have not heard from us then please contact us. The fastest way to get your results is to register for My Chart.   Health Maintenance, Female Adopting a healthy lifestyle and getting preventive care can go a long way to promote health and wellness. Talk with your health care provider about what schedule of regular examinations is right for you. This is a good chance for you to check in with your provider about disease prevention and staying healthy. In between checkups, there are plenty of things you can do on your own. Experts have done a lot of research about which lifestyle changes and preventive measures are most likely to keep you healthy. Ask your health care provider for more information. Weight and diet Eat a healthy diet  Be sure to include plenty of vegetables, fruits, low-fat dairy products, and lean protein.  Do not eat a lot of foods high in solid fats, added sugars, or salt.  Get regular exercise. This is one of the most important things you can do for your health. ? Most adults should exercise for at least 150 minutes each week. The exercise should increase your heart rate and make you sweat (moderate-intensity exercise). ? Most adults should also do strengthening exercises at least twice a week. This is in addition to the moderate-intensity exercise.  Maintain a healthy weight  Body mass index (BMI) is a measurement that can be used to identify possible weight problems. It estimates body fat based on height and weight. Your health care provider can help determine your BMI and help you achieve or maintain a healthy weight.  For females 20 years of age and older: ? A BMI below 18.5 is considered underweight. ? A BMI of 18.5 to 24.9 is normal. ? A BMI of 25 to 29.9 is considered overweight. ? A BMI of 30 and above is considered obese.  Watch levels of cholesterol and  blood lipids  You should start having your blood tested for lipids and cholesterol at 44 years of age, then have this test every 5 years.  You may need to have your cholesterol levels checked more often if: ? Your lipid or cholesterol levels are high. ? You are older than 44 years of age. ? You are at high risk for heart disease.  Cancer screening Lung Cancer  Lung cancer screening is recommended for adults 55-80 years old who are at high risk for lung cancer because of a history of smoking.  A yearly low-dose CT scan of the lungs is recommended for people who: ? Currently smoke. ? Have quit within the past 15 years. ? Have at least a 30-pack-year history of smoking. A pack year is smoking an average of one pack of cigarettes a day for 1 year.  Yearly screening should continue until it has been 15 years since you quit.  Yearly screening should stop if you develop a health problem that would prevent you from having lung cancer treatment.  Breast Cancer  Practice breast self-awareness. This means understanding how your breasts normally appear and feel.  It also means doing regular breast self-exams. Let your health care provider know about any changes, no matter how small.  If you are in your 20s or 30s, you should have a clinical breast exam (CBE) by a health care provider every 1-3 years as part of a regular health   exam.  If you are 40 or older, have a CBE every year. Also consider having a breast X-ray (mammogram) every year.  If you have a family history of breast cancer, talk to your health care provider about genetic screening.  If you are at high risk for breast cancer, talk to your health care provider about having an MRI and a mammogram every year.  Breast cancer gene (BRCA) assessment is recommended for women who have family members with BRCA-related cancers. BRCA-related cancers include: ? Breast. ? Ovarian. ? Tubal. ? Peritoneal cancers.  Results of the assessment  will determine the need for genetic counseling and BRCA1 and BRCA2 testing.  Cervical Cancer Your health care provider may recommend that you be screened regularly for cancer of the pelvic organs (ovaries, uterus, and vagina). This screening involves a pelvic examination, including checking for microscopic changes to the surface of your cervix (Pap test). You may be encouraged to have this screening done every 3 years, beginning at age 21.  For women ages 30-65, health care providers may recommend pelvic exams and Pap testing every 3 years, or they may recommend the Pap and pelvic exam, combined with testing for human papilloma virus (HPV), every 5 years. Some types of HPV increase your risk of cervical cancer. Testing for HPV may also be done on women of any age with unclear Pap test results.  Other health care providers may not recommend any screening for nonpregnant women who are considered low risk for pelvic cancer and who do not have symptoms. Ask your health care provider if a screening pelvic exam is right for you.  If you have had past treatment for cervical cancer or a condition that could lead to cancer, you need Pap tests and screening for cancer for at least 20 years after your treatment. If Pap tests have been discontinued, your risk factors (such as having a new sexual partner) need to be reassessed to determine if screening should resume. Some women have medical problems that increase the chance of getting cervical cancer. In these cases, your health care provider may recommend more frequent screening and Pap tests.  Colorectal Cancer  This type of cancer can be detected and often prevented.  Routine colorectal cancer screening usually begins at 44 years of age and continues through 44 years of age.  Your health care provider may recommend screening at an earlier age if you have risk factors for colon cancer.  Your health care provider may also recommend using home test kits to  check for hidden blood in the stool.  A small camera at the end of a tube can be used to examine your colon directly (sigmoidoscopy or colonoscopy). This is done to check for the earliest forms of colorectal cancer.  Routine screening usually begins at age 50.  Direct examination of the colon should be repeated every 5-10 years through 44 years of age. However, you may need to be screened more often if early forms of precancerous polyps or small growths are found.  Skin Cancer  Check your skin from head to toe regularly.  Tell your health care provider about any new moles or changes in moles, especially if there is a change in a mole's shape or color.  Also tell your health care provider if you have a mole that is larger than the size of a pencil eraser.  Always use sunscreen. Apply sunscreen liberally and repeatedly throughout the day.  Protect yourself by wearing long sleeves, pants, a wide-brimmed   sunglasses whenever you are outside.  Heart disease, diabetes, and high blood pressure  High blood pressure causes heart disease and increases the risk of stroke. High blood pressure is more likely to develop in: ? People who have blood pressure in the high end of the normal range (130-139/85-89 mm Hg). ? People who are overweight or obese. ? People who are African American.  If you are 67-53 years of age, have your blood pressure checked every 3-5 years. If you are 45 years of age or older, have your blood pressure checked every year. You should have your blood pressure measured twice-once when you are at a hospital or clinic, and once when you are not at a hospital or clinic. Record the average of the two measurements. To check your blood pressure when you are not at a hospital or clinic, you can use: ? An automated blood pressure machine at a pharmacy. ? A home blood pressure monitor.  If you are between 47 years and 65 years old, ask your health care provider if you should  take aspirin to prevent strokes.  Have regular diabetes screenings. This involves taking a blood sample to check your fasting blood sugar level. ? If you are at a normal weight and have a low risk for diabetes, have this test once every three years after 44 years of age. ? If you are overweight and have a high risk for diabetes, consider being tested at a younger age or more often. Preventing infection Hepatitis B  If you have a higher risk for hepatitis B, you should be screened for this virus. You are considered at high risk for hepatitis B if: ? You were born in a country where hepatitis B is common. Ask your health care provider which countries are considered high risk. ? Your parents were born in a high-risk country, and you have not been immunized against hepatitis B (hepatitis B vaccine). ? You have HIV or AIDS. ? You use needles to inject street drugs. ? You live with someone who has hepatitis B. ? You have had sex with someone who has hepatitis B. ? You get hemodialysis treatment. ? You take certain medicines for conditions, including cancer, organ transplantation, and autoimmune conditions.  Hepatitis C  Blood testing is recommended for: ? Everyone born from [birth year] through 1967. ? Anyone with known risk factors for hepatitis C.  Sexually transmitted infections (STIs)  You should be screened for sexually transmitted infections (STIs) including gonorrhea and chlamydia if: ? You are sexually active and are younger than 44 years of age. ? You are older than 44 years of age and your health care provider tells you that you are at risk for this type of infection. ? Your sexual activity has changed since you were last screened and you are at an increased risk for chlamydia or gonorrhea. Ask your health care provider if you are at risk.  If you do not have HIV, but are at risk, it may be recommended that you take a prescription medicine daily to prevent HIV infection. This is called  pre-exposure prophylaxis (PrEP). You are considered at risk if: ? You are sexually active and do not regularly use condoms or know the HIV status of your partner(s). ? You take drugs by injection. ? You are sexually active with a partner who has HIV.  Talk with your health care provider about whether you are at high risk of being infected with HIV. If you choose to begin PrEP, you  PrEP, you should first be tested for HIV. You should then be tested every 3 months for as long as you are taking PrEP. Pregnancy  If you are premenopausal and you may become pregnant, ask your health care provider about preconception counseling.  If you may become pregnant, take 400 to 800 micrograms (mcg) of folic acid every day.  If you want to prevent pregnancy, talk to your health care provider about birth control (contraception). Osteoporosis and menopause  Osteoporosis is a disease in which the bones lose minerals and strength with aging. This can result in serious bone fractures. Your risk for osteoporosis can be identified using a bone density scan.  If you are 65 years of age or older, or if you are at risk for osteoporosis and fractures, ask your health care provider if you should be screened.  Ask your health care provider whether you should take a calcium or vitamin D supplement to lower your risk for osteoporosis.  Menopause may have certain physical symptoms and risks.  Hormone replacement therapy may reduce some of these symptoms and risks. Talk to your health care provider about whether hormone replacement therapy is right for you. Follow these instructions at home:  Schedule regular health, dental, and eye exams.  Stay current with your immunizations.  Do not use any tobacco products including cigarettes, chewing tobacco, or electronic cigarettes.  If you are pregnant, do not drink alcohol.  If you are breastfeeding, limit how much and how often you drink alcohol.  Limit alcohol intake to no more  than 1 drink per day for nonpregnant women. One drink equals 12 ounces of beer, 5 ounces of wine, or 1 ounces of hard liquor.  Do not use street drugs.  Do not share needles.  Ask your health care provider for help if you need support or information about quitting drugs.  Tell your health care provider if you often feel depressed.  Tell your health care provider if you have ever been abused or do not feel safe at home. This information is not intended to replace advice given to you by your health care provider. Make sure you discuss any questions you have with your health care provider. Document Released: 04/08/2011 Document Revised: 02/29/2016 Document Reviewed: 06/27/2015 Elsevier Interactive Patient Education  2018 Elsevier Inc.   IF you received an x-ray today, you will receive an invoice from [facility]. Please contact [facility] at [phone] with questions or concerns regarding your invoice.   IF you received labwork today, you will receive an invoice from [facility]. Please contact [facility] at [phone] with questions or concerns regarding your invoice.   Our billing staff will not be able to assist you with questions regarding bills from these companies.  You will be contacted with the lab results as soon as they are available. The fastest way to get your results is to activate your My Chart account. Instructions are located on the last page of this paperwork. If you have not heard from us regarding the results in 2 weeks, please contact this office.     

## 2018-08-18 LAB — CBC WITH DIFFERENTIAL/PLATELET
Basophils Absolute: 0 10*3/uL (ref 0.0–0.2)
Basos: 0 %
EOS (ABSOLUTE): 0.4 10*3/uL (ref 0.0–0.4)
Eos: 4 %
HEMOGLOBIN: 12.2 g/dL (ref 11.1–15.9)
Hematocrit: 37.2 % (ref 34.0–46.6)
Immature Grans (Abs): 0 10*3/uL (ref 0.0–0.1)
Immature Granulocytes: 0 %
LYMPHS ABS: 1.8 10*3/uL (ref 0.7–3.1)
LYMPHS: 20 %
MCH: 26.5 pg — ABNORMAL LOW (ref 26.6–33.0)
MCHC: 32.8 g/dL (ref 31.5–35.7)
MCV: 81 fL (ref 79–97)
MONOCYTES: 10 %
Monocytes Absolute: 0.9 10*3/uL (ref 0.1–0.9)
Neutrophils Absolute: 5.8 10*3/uL (ref 1.4–7.0)
Neutrophils: 66 %
PLATELETS: 420 10*3/uL (ref 150–450)
RBC: 4.6 x10E6/uL (ref 3.77–5.28)
RDW: 14.4 % (ref 12.3–15.4)
WBC: 9 10*3/uL (ref 3.4–10.8)

## 2018-08-18 LAB — CMP14+EGFR
ALBUMIN: 3.7 g/dL (ref 3.5–5.5)
ALK PHOS: 57 IU/L (ref 39–117)
ALT: 14 IU/L (ref 0–32)
AST: 15 IU/L (ref 0–40)
Albumin/Globulin Ratio: 1.2 (ref 1.2–2.2)
BILIRUBIN TOTAL: 0.6 mg/dL (ref 0.0–1.2)
BUN / CREAT RATIO: 13 (ref 9–23)
BUN: 10 mg/dL (ref 6–24)
CO2: 22 mmol/L (ref 20–29)
Calcium: 8.7 mg/dL (ref 8.7–10.2)
Chloride: 102 mmol/L (ref 96–106)
Creatinine, Ser: 0.78 mg/dL (ref 0.57–1.00)
GFR calc non Af Amer: 93 mL/min/{1.73_m2} (ref >59)
GFR, EST AFRICAN AMERICAN: 107 mL/min/{1.73_m2} (ref >59)
Globulin, Total: 3 g/dL (ref 1.5–4.5)
Glucose: 96 mg/dL (ref 65–99)
Potassium: 3.8 mmol/L (ref 3.5–5.2)
SODIUM: 137 mmol/L (ref 134–144)
Total Protein: 6.7 g/dL (ref 6.0–8.5)

## 2018-08-18 LAB — LIPID PANEL
CHOL/HDL RATIO: 2.8 ratio (ref 0.0–4.4)
Cholesterol, Total: 166 mg/dL (ref 100–199)
HDL: 59 mg/dL (ref >39)
LDL CALC: 91 mg/dL (ref 0–99)
Triglycerides: 82 mg/dL (ref 0–149)
VLDL Cholesterol Cal: 16 mg/dL (ref 5–40)

## 2018-08-18 LAB — TSH: TSH: 4.34 u[IU]/mL (ref 0.450–4.500)

## 2018-08-21 ENCOUNTER — Ambulatory Visit: Payer: Federal, State, Local not specified - PPO | Admitting: Podiatry

## 2018-08-21 ENCOUNTER — Telehealth: Payer: Self-pay | Admitting: Allergy and Immunology

## 2018-08-21 NOTE — Telephone Encounter (Signed)
Spoke with patient and advised of the standing order for Pepcid 20mg  twice daily.  Patient has started that per PCP started her on this a few days ago but it is not helping.  She is concerned that Pepcid will not help with her itching and is wondering if there is anything else she can take to help?

## 2018-08-21 NOTE — Telephone Encounter (Signed)
Patient is calling about ZANTAC -  Its cancer causing issues has made her wonder what else she can take instead of this medication ??

## 2018-08-21 NOTE — Telephone Encounter (Signed)
I reviewed her note from March 2019.  The Pepcid should replace the Zantac.  Zantac was the drug which.  Please make sure she is using the Zyrtec as recommended as well (2 tablets twice daily).   If this is not working, we may need to start her on Xolair.  We can discuss this when she was supposed to follow-up in May 2019.  Salvatore Marvel, MD [facility]

## 2018-08-21 NOTE — Telephone Encounter (Signed)
Spoke with patient.  She understands plan and will call with concerns.

## 2018-08-24 ENCOUNTER — Encounter: Payer: Self-pay | Admitting: Podiatry

## 2018-08-24 ENCOUNTER — Ambulatory Visit (INDEPENDENT_AMBULATORY_CARE_PROVIDER_SITE_OTHER): Payer: Federal, State, Local not specified - PPO

## 2018-08-24 ENCOUNTER — Encounter: Payer: Federal, State, Local not specified - PPO | Admitting: Physician Assistant

## 2018-08-24 ENCOUNTER — Ambulatory Visit (INDEPENDENT_AMBULATORY_CARE_PROVIDER_SITE_OTHER): Payer: Federal, State, Local not specified - PPO | Admitting: Physician Assistant

## 2018-08-24 ENCOUNTER — Ambulatory Visit: Payer: Federal, State, Local not specified - PPO | Admitting: Podiatry

## 2018-08-24 VITALS — BP 125/75 | HR 79 | Resp 16

## 2018-08-24 DIAGNOSIS — N920 Excessive and frequent menstruation with regular cycle: Secondary | ICD-10-CM | POA: Insufficient documentation

## 2018-08-24 DIAGNOSIS — M779 Enthesopathy, unspecified: Secondary | ICD-10-CM | POA: Diagnosis not present

## 2018-08-24 DIAGNOSIS — N644 Mastodynia: Secondary | ICD-10-CM | POA: Insufficient documentation

## 2018-08-24 DIAGNOSIS — Z0000 Encounter for general adult medical examination without abnormal findings: Secondary | ICD-10-CM | POA: Diagnosis not present

## 2018-08-24 DIAGNOSIS — I10 Essential (primary) hypertension: Secondary | ICD-10-CM | POA: Insufficient documentation

## 2018-08-24 DIAGNOSIS — M1990 Unspecified osteoarthritis, unspecified site: Secondary | ICD-10-CM | POA: Insufficient documentation

## 2018-08-24 DIAGNOSIS — M84371A Stress fracture, right ankle, initial encounter for fracture: Secondary | ICD-10-CM

## 2018-08-24 DIAGNOSIS — M84374A Stress fracture, right foot, initial encounter for fracture: Secondary | ICD-10-CM | POA: Diagnosis not present

## 2018-08-24 DIAGNOSIS — M778 Other enthesopathies, not elsewhere classified: Secondary | ICD-10-CM

## 2018-08-24 LAB — POCT URINALYSIS DIP (MANUAL ENTRY)
Bilirubin, UA: NEGATIVE
GLUCOSE UA: NEGATIVE mg/dL
Ketones, POC UA: NEGATIVE mg/dL
LEUKOCYTES UA: NEGATIVE
NITRITE UA: NEGATIVE
PH UA: 6 (ref 5.0–8.0)
PROTEIN UA: NEGATIVE mg/dL
SPEC GRAV UA: 1.015 (ref 1.010–1.025)
Urobilinogen, UA: 0.2 E.U./dL

## 2018-08-24 NOTE — Patient Instructions (Signed)
Stress Fracture Stress fracture is a small break or crack in a bone. A stress fracture can be fully broken (complete) or partially broken (incomplete). The most common sites for stress fractures are the bones in the front of your feet (metatarsals), your heels (calcaneus), and the long bone of your lower leg (tibia). What are the causes? A stress fracture is caused by overuse or repetitive exercise, such as running. It happens when a bone cannot absorb any more shock because the muscles around it are weak. Stress fractures happen most commonly when:  You rapidly increase or start a new physical activity.  You use shoes that are worn out or do not fit you properly.  You exercise on a new surface.  What increases the risk? You may be at higher risk for this type of fracture if:  You have a condition that causes weak bones (osteoporosis).  You are female. Stress fractures are more likely to occur in women.  What are the signs or symptoms? The most common symptom of a stress fracture is feeling pain when you are using the affected part of your body. The pain usually goes away when you are resting. Other symptoms may include:  Swelling of the affected area.  Pain in the area when it is touched.  Decreased pain while resting.  Stress fracture pain usually develops over time. How is this diagnosed? Diagnosis may include:  Medical history and physical exam.  X-rays.  Bone scan.  MRI.  How is this treated? Treatment depends on the severity of your stress fracture. Treatment usually involves resting, icing, compression, and elevation (RICE) of the affected part of your body. Treatment may also include:  Medicines to reduce inflammation.  A cast or a walking shoe.  Crutches.  Surgery.  Follow these instructions at home: If you have a cast:  Do not stick anything inside the cast to scratch your skin. Doing that increases your risk of infection.  Check the skin around the  cast every day. Report any concerns to your health care provider. You may put lotion on dry skin around the edges of the cast. Do not apply lotion to the skin underneath the cast.  Keep the cast clean and dry.  Cover the cast with a watertight plastic bag to protect it from water while you take a bath or a shower. Do not let the cast get wet.  Do not put pressure on any part of the cast until it is fully hardened. This may take several hours. If You Have a Walking Shoe:   Wear it as directed by your health care provider. Managing pain, stiffness, and swelling  If directed, apply ice to the injured area: ? Put ice in a plastic bag. ? Place a towel between your skin and the bag. ? Leave the ice on for 20 minutes, 2-3 times per day.  Move your fingers or toes often to avoid stiffness and to lessen swelling.  Raise the injured area above the level of your heart while you are sitting or lying down. Activity  Rest as directed by your health care provider. Ask your health care provider if you may do alternative exercises, such as swimming or biking, while you are healing.  Return to your normal activities as directed by your health care provider. Ask your health care provider what activities are safe for you.  Perform range-of-motion exercises only as directed by your health care provider. Safety  Do not use the injured limb to support   yourbody weight until your health care provider says that you can. Use crutches if your health care provider tells you to do so. General instructions  Do not use any tobacco products, including cigarettes, chewing tobacco, or electronic cigarettes. Tobacco can delay bone healing. If you need help quitting, ask your health care provider.  Take medicines only as directed by your health care provider.  Keep all follow-up visits as directed by your health care provider. This is important. How is this prevented?  Only wear shoes that: ? Fit well. ? Are  not worn out.  Eat a healthy diet that contains vitamin D and calcium. This helps keeps your bones strong.  Be careful when you start a new physical activity. Give your body time to adjust.  Avoid doing only one kind of activity. Do different exercises, such as swimming and running, so that no single part of your body gets overused.  Do strength-training exercises. Contact a health care provider if:  Your pain gets worse.  You have new symptoms.  You have increased swelling. Get help right away if:  You lose feeling in the affected area. This information is not intended to replace advice given to you by your health care provider. Make sure you discuss any questions you have with your health care provider. Document Released: 12/14/2002 Document Revised: 05/22/2016 Document Reviewed: 04/28/2014 Elsevier Interactive Patient Education  2018 Elsevier Inc.  

## 2018-08-24 NOTE — Addendum Note (Signed)
Addended by: Doran Clay on: 08/24/2018 11:36 AM   Modules accepted: Orders

## 2018-08-25 ENCOUNTER — Encounter: Payer: Self-pay | Admitting: Physician Assistant

## 2018-08-25 ENCOUNTER — Telehealth: Payer: Self-pay | Admitting: Podiatry

## 2018-08-25 LAB — VITAMIN D 25 HYDROXY (VIT D DEFICIENCY, FRACTURES): VIT D 25 HYDROXY: 27.7 ng/mL — AB (ref 30.0–100.0)

## 2018-08-25 NOTE — Telephone Encounter (Signed)
Patient wanted to speak with you personally, about some issues, she said not the Nurse.

## 2018-08-25 NOTE — Telephone Encounter (Signed)
I called her back. She states that because I told her to wear the CAM boot all the time she is crawling around. Also she was asking if she could drive in the boot. I advised to NOT drive while wearing the boot. We can get her a surgical shoe. She is going to come by the office to pick this up either today or tomorrow.

## 2018-08-26 DIAGNOSIS — M84374A Stress fracture, right foot, initial encounter for fracture: Secondary | ICD-10-CM | POA: Insufficient documentation

## 2018-08-26 NOTE — Progress Notes (Signed)
Subjective:   Patient ID: Sherri Buckley, female   DOB: 42 y.o.   MRN: [id number]   HPI 41 year old female presents the office today for concerns of pain, swelling for the last 2 weeks to the right foot.  She states that hurts in the top of her foot.  She states that she does have some arthritis in her ankle and she sees Dr. Doran Durand for this and she is an injection once a year but this is been doing well.  She denies any recent injury or trauma denies any change in activity level.  She is been taking Advil which does not help at all. No redness or warmth.    Review of Systems  All other systems reviewed and are negative.  Past Medical History:  Diagnosis Date  . Allergy   . Asthma   . Blood type, Rh negative   . Chronic bronchitis ([place])    "get it q yr"  . Depression 10/07/1998  . VQMGQQPY(195.0)    "monthly" (12/21/2014)  . Osteoarthritis of ankle, right   . Seasonal allergies   . Substance abuse ([place])   . Urticaria     Past Surgical History:  Procedure Laterality Date  . BREAST BIOPSY Right ~ 1993   "took out some benign tissue"     Current Outpatient Medications:  .  albuterol (PROVENTIL HFA;VENTOLIN HFA) 108 (90 Base) MCG/ACT inhaler, Inhale 2 puffs into the lungs every 6 (six) hours as needed for wheezing or shortness of breath., Disp: 18 g, Rfl: 1 .  beclomethasone (QVAR) 40 MCG/ACT inhaler, INHALE TWO PUFFS INTO THE LUNGS TWO TIMES DAILY, Disp: 1 Inhaler, Rfl: 4 .  cetirizine (ZYRTEC) 10 MG tablet, Take 1 tablet (10 mg total) by mouth daily., Disp: 90 tablet, Rfl: 3 .  Ciclopirox 1 % shampoo, ciclopirox 1 % shampoo, Disp: , Rfl:  .  Fluocinolone Acetonide Scalp 0.01 % OIL, fluocinolone 0.01 % topical body oil, Disp: , Rfl:  .  fluticasone (FLONASE) 50 MCG/ACT nasal spray, Place 2 sprays into both nostrils daily., Disp: 16 g, Rfl: 6 .  Ibuprofen (ADVIL) 200 MG CAPS, Advil  prn, Disp: , Rfl:  .  Multiple Vitamins-Minerals (MULTIVITAMIN ADULT PO), multivitamin, Disp: ,  Rfl:   Current Facility-Administered Medications:  .  albuterol (PROVENTIL) (2.5 MG/3ML) 0.083% nebulizer solution 2.5 mg, 2.5 mg, Nebulization, Once, Tamala Julian, Renette Butters, MD  No Known Allergies       Objective:  Physical Exam  General: AAO x3, NAD  Dermatological: Skin is warm, dry and supple bilateral. Nails x 10 are well manicured; remaining integument appears unremarkable at this time. There are no open sores, no preulcerative lesions, no rash or signs of infection present.  Vascular: Dorsalis Pedis artery and Posterior Tibial artery pedal pulses are 2/4 bilateral with immedate capillary fill time.  There is no pain with calf compression, swelling, warmth, erythema.   Neruologic: Grossly intact via light touch bilateral. Protective threshold with Semmes Wienstein monofilament intact to all pedal sites bilateral.   Musculoskeletal: There is tenderness to the dorsal aspect of the right foot specifically on the second third metatarsals and there is edema to this area is no erythema warmth.  There is no other areas of pinpoint tenderness identified at this time.  Muscular strength 5/5 in all groups tested bilateral.  Gait: Unassisted, Nonantalgic.     Assessment:   42 year old female stress fracture right third metatarsal    Plan:  -Treatment options discussed including all alternatives, risks, and complications -  Etiology of symptoms were discussed -X-rays were obtained and reviewed with the patient.  Periosteal reaction of the third metatarsal neck concerning for stress fracture. -Cam boot was dispensed.  I also added to guard to help protect the toes while working.  Ice and elevation.  Declines pain medication. -Will check vitamin D levels  Return in about 2 weeks (around 09/07/2018). Repeat x-ray   Trula Slade DPM

## 2018-09-07 ENCOUNTER — Ambulatory Visit (INDEPENDENT_AMBULATORY_CARE_PROVIDER_SITE_OTHER): Payer: Federal, State, Local not specified - PPO | Admitting: Podiatry

## 2018-09-07 ENCOUNTER — Ambulatory Visit (INDEPENDENT_AMBULATORY_CARE_PROVIDER_SITE_OTHER): Payer: Federal, State, Local not specified - PPO

## 2018-09-07 DIAGNOSIS — M84374A Stress fracture, right foot, initial encounter for fracture: Secondary | ICD-10-CM

## 2018-09-07 DIAGNOSIS — M84374D Stress fracture, right foot, subsequent encounter for fracture with routine healing: Secondary | ICD-10-CM | POA: Diagnosis not present

## 2018-09-07 NOTE — Progress Notes (Signed)
Subjective: 42 year old female presents the office today for follow-up evaluation of a stress fracture to her right foot.  She states that since I last saw her the swelling is come out of bed and she is having very minimal discomfort.  She denies any recent injury or trauma she has no other concerns today. Denies any systemic complaints such as fevers, chills, nausea, vomiting. No acute changes since last appointment, and no other complaints at this time.   Objective: AAO x3, NAD DP/PT pulses palpable bilaterally, CRT less than 3 seconds There is some mild swelling on the right foot compared to contralateral extremity.  There is no tenderness palpation no area pinpoint tenderness.  There is no open lesions identified.  Subjectively she is getting some discomfort but she is not taking any pain medicine. No open lesions or pre-ulcerative lesions.  No pain with calf compression, swelling, warmth, erythema  Assessment: Stress fracture right foot third metatarsal  Plan: -All treatment options discussed with the patient including all alternatives, risks, complications.  -Repeat x-rays were obtained reviewed.  Periosteal reaction along the third metatarsal neck.  No evidence of acute fracture or other stress fracture identified at this time. -Although she is doing better I want her to remain in the surgical boot.  Continue with Ace bandage for compression, swelling.  Ice elevation. -I will see her back in 4 weeks and have repeat x-rays at that time I encouraged her to call any questions or concerns in the meantime she has to walk quite a distance at work.  We will get her a temporary handicap sticker so she can park closer to minimize her walking.  Trula Slade DPM

## 2018-09-21 DIAGNOSIS — K080 Exfoliation of teeth due to systemic causes: Secondary | ICD-10-CM | POA: Diagnosis not present

## 2018-10-02 ENCOUNTER — Ambulatory Visit: Payer: Self-pay | Admitting: *Deleted

## 2018-10-02 ENCOUNTER — Encounter: Payer: Self-pay | Admitting: Emergency Medicine

## 2018-10-02 ENCOUNTER — Ambulatory Visit: Payer: Federal, State, Local not specified - PPO

## 2018-10-02 ENCOUNTER — Ambulatory Visit
Admission: EM | Admit: 2018-10-02 | Discharge: 2018-10-02 | Disposition: A | Discharge status: Home / Self Care | Payer: Federal, State, Local not specified - PPO | Attending: Emergency Medicine | Admitting: Emergency Medicine

## 2018-10-02 DIAGNOSIS — J209 Acute bronchitis, unspecified: Secondary | ICD-10-CM | POA: Insufficient documentation

## 2018-10-02 DIAGNOSIS — R05 Cough: Secondary | ICD-10-CM | POA: Diagnosis not present

## 2018-10-02 MED ORDER — IPRATROPIUM-ALBUTEROL 0.5-2.5 (3) MG/3ML IN SOLN
3.0000 mL | Freq: Once | RESPIRATORY_TRACT | Status: AC
  Administered 2018-10-02: 3 mL via RESPIRATORY_TRACT

## 2018-10-02 MED ORDER — ALBUTEROL SULFATE HFA 108 (90 BASE) MCG/ACT IN AERS: 2.0000 | INHALATION_SPRAY | Freq: Four times a day (QID) | RESPIRATORY_TRACT | 1 refills | Status: DC | PRN

## 2018-10-02 MED ORDER — PREDNISONE 50 MG PO TABS: 50.0000 mg | ORAL_TABLET | Freq: Every day | ORAL | 0 refills | Status: DC

## 2018-10-02 MED ORDER — BECLOMETHASONE DIPROP HFA 40 MCG/ACT IN AERB: INHALATION_SPRAY | RESPIRATORY_TRACT | 4 refills | Status: DC

## 2018-10-02 MED ORDER — PSEUDOEPH-BROMPHEN-DM 30-2-10 MG/5ML PO SYRP: 5.0000 mL | ORAL_SOLUTION | Freq: Four times a day (QID) | ORAL | 0 refills | Status: DC | PRN

## 2018-10-02 MED ORDER — PREDNISONE 50 MG PO TABS: 50.0000 mg | ORAL_TABLET | Freq: Every day | ORAL | 0 refills | Status: AC

## 2018-10-02 MED ORDER — AZITHROMYCIN 250 MG PO TABS: 250.0000 mg | ORAL_TABLET | Freq: Every day | ORAL | 0 refills | Status: DC

## 2018-10-02 NOTE — ED Notes (Signed)
Patient able to ambulate independently  

## 2018-10-02 NOTE — ED Triage Notes (Signed)
Pt presents to [facility] for assessment of increasing difficulty managing her asthma at home.  Denies cough, states need to increase use of rescue inhaler.  Also states she hears "crackling" when she lays down and concerned for pneumonia.

## 2018-10-02 NOTE — Discharge Instructions (Signed)
No pneumonia on x-ray Please begin prednisone daily for the next 5 days, please take with food in the morning if you are able, this should help with wheezing, shortness of breath and inflammation in lungs Since her symptoms have been going on for 3 weeks I am still going to go ahead and give you an antibiotic, please begin azithromycin-2 tablets today, 1 tablet for the following 4 days I have sent in a refill of your albuterol and Qvar inhaler.  Please contact your PCP for further refills Continue daily Zyrtec May use cough syrup provided as needed  To monitor breathing, temperature and symptoms, please follow-up if symptoms persisting, not improving with above recommendations or worsening

## 2018-10-02 NOTE — ED Provider Notes (Signed)
[name]    CSN: [id number] Arrival date & time: 10/02/18  1519     History   Chief Complaint Chief Complaint  Patient presents with  . Asthma    HPI Sherri Buckley is a 42 y.o. female history of asthma presenting today for evaluation of a cough.  Patient has had worsening asthma and need for her rescue inhaler more frequently.  She has noticed some crackling and wheezing throughout her chest.  Symptoms worse at nighttime.  She has had some mild associated nasal congestion and drainage.  She takes daily Zyrtec for this.  She also has Qvar inhaler that she is supposed to take twice a day, but states that she only uses it once a day.  She is concerned about pneumonia.  She denies any fevers.  HPI  Past Medical History:  Diagnosis Date  . Allergy   . Asthma   . Blood type, Rh negative   . Chronic bronchitis ([place])    "get it q yr"  . Depression 10/07/1998  . GQQPYPPJ(093.2)    "monthly" (12/21/2014)  . Osteoarthritis of ankle, right   . Seasonal allergies   . Substance abuse ([place])   . Urticaria     Patient Active Problem List   Diagnosis Date Noted  . Stress fracture, right foot, initial encounter for fracture 08/26/2018  . Arthritis 08/24/2018  . Hypertensive disorder 08/24/2018  . Menorrhagia 08/24/2018  . Pain of breast 08/24/2018  . Dysfunctional uterine bleeding 08/17/2018  . Irregular intermenstrual bleeding 08/17/2018  . History of substance use 08/17/2018  . Osteoarthritis of right foot 06/22/2018  . Seasonal and perennial allergic rhinitis 12/15/2017  . Chronic urticaria 12/15/2017  . Mild persistent asthma, uncomplicated 67/09/4579  . Chronic nonintractable headache 03/10/2017  . Nasal turbinate hypertrophy 03/10/2017  . Anxiety about health 02/17/2017  . Galactorrhea not associated with childbirth 10/28/2016  . Asthma 02/21/2016  . BMI 36.0-36.9,adult 02/21/2016  . Palpitation 12/22/2014  . Elevated troponin 12/21/2014  . Allergic  rhinitis 03/06/2014  . Nipple discharge 06/21/2013  . Hypovitaminosis D 02/09/2013  . Alcohol abuse 02/09/2013  . Breast lump 10/07/2012    Past Surgical History:  Procedure Laterality Date  . BREAST BIOPSY Right ~ 1993   "took out some benign tissue"    OB History    Gravida  9   Para  0   Term      Preterm      AB  8   Living  0     SAB  1   TAB      Ectopic      Multiple      Live Births               Home Medications    Prior to Admission medications   Medication Sig Start Date End Date Taking? Authorizing Provider  cetirizine (ZYRTEC) 10 MG tablet Take 1 tablet (10 mg total) by mouth daily. 08/17/18  Yes Timmothy Euler, [place] D, PA-C  fluticasone (FLONASE) 50 MCG/ACT nasal spray Place 2 sprays into both nostrils daily. 08/17/18  Yes Timmothy Euler, [place] D, PA-C  Multiple Vitamins-Minerals (MULTIVITAMIN ADULT PO) multivitamin   Yes [provider]  albuterol (PROVENTIL HFA;VENTOLIN HFA) 108 (90 Base) MCG/ACT inhaler Inhale 2 puffs into the lungs every 6 (six) hours as needed for wheezing or shortness of breath. 10/02/18   Sylas Twombly C, PA-C  azithromycin (ZITHROMAX) 250 MG tablet Take 1 tablet (250 mg total) by mouth daily. Take first  2 tablets together, then 1 every day until finished. 10/02/18   Omkar Stratmann C, PA-C  beclomethasone (QVAR) 40 MCG/ACT inhaler INHALE TWO PUFFS INTO THE LUNGS TWO TIMES DAILY 10/02/18   Havard Radigan C, PA-C  brompheniramine-pseudoephedrine-DM 30-2-10 MG/5ML syrup Take 5 mLs by mouth 4 (four) times daily as needed. 10/02/18   Jolynn Bajorek C, PA-C  Ibuprofen (ADVIL) 200 MG CAPS Advil  prn    [provider]  predniSONE (DELTASONE) 50 MG tablet Take 1 tablet (50 mg total) by mouth daily with breakfast for 5 days. 10/02/18 10/07/18  Giuliana Handyside, Elesa Hacker, PA-C    Family History Family History  Problem Relation Age of Onset  . Heart disease Maternal Grandmother        9 bypass surgery  . Cancer Maternal  Grandmother        mouth  . Sickle cell trait Father   . Seizures Father   . Alcohol abuse Father   . Thyroid disease Mother        as a child  . Hypertension Mother   . Anemia Mother   . Migraines Mother   . Hernia Mother   . Diabetes Mother   . Hypertension Paternal Grandmother   . Diabetes Paternal Grandmother   . Arthritis Maternal Aunt     Social History Social History   Tobacco Use  . Smoking status: Former Research scientist (life sciences)  . Smokeless tobacco: Never Used  . Tobacco comment: black and milds  Substance Use Topics  . Alcohol use: Yes    Alcohol/week: 8.0 standard drinks    Types: 8 Shots of liquor per week  . Drug use: Not Currently    Types: Marijuana    Comment: "quit smoking marijuana in 2013"     Allergies   Patient has no known allergies.   Review of Systems Review of Systems  Constitutional: Negative for activity change, appetite change, chills, fatigue and fever.  HENT: Positive for congestion, postnasal drip and rhinorrhea. Negative for ear pain, sinus pressure, sore throat and trouble swallowing.   Eyes: Negative for discharge and redness.  Respiratory: Positive for cough, shortness of breath and wheezing. Negative for chest tightness.   Cardiovascular: Negative for chest pain.  Gastrointestinal: Negative for abdominal pain, diarrhea, nausea and vomiting.  Musculoskeletal: Negative for myalgias.  Skin: Negative for rash.  Neurological: Negative for dizziness, light-headedness and headaches.     Physical Exam Triage Vital Signs ED Triage Vitals [10/02/18 1535]  Enc Vitals Group     BP 138/88     Pulse Rate 91     Resp 16     Temp 98.1 F (36.7 C)     Temp Source Oral     SpO2 97 %     Weight      Height      Head Circumference      Peak Flow      Pain Score 0     Pain Loc      Pain Edu?      Excl. in [place]?    No data found.  Updated Vital Signs BP 138/88 (BP Location: Left Arm)   Pulse 91   Temp 98.1 F (36.7 C) (Oral)   Resp 16   LMP  10/02/2018   SpO2 97%   Visual Acuity Right Eye Distance:   Left Eye Distance:   Bilateral Distance:    Right Eye Near:   Left Eye Near:    Bilateral Near:     Physical Exam  Vitals signs and nursing note reviewed.  Constitutional:      General: She is not in acute distress.    Appearance: She is well-developed.  HENT:     Head: Normocephalic and atraumatic.     Ears:     Comments: Bilateral ears without tenderness to palpation of external auricle, tragus and mastoid, EAC's without erythema or swelling, TM's with good bony landmarks and cone of light. Non erythematous.    Mouth/Throat:     Comments: Oral mucosa pink and moist, no tonsillar enlargement or exudate. Posterior pharynx patent and nonerythematous, no uvula deviation or swelling. Normal phonation. Eyes:     Conjunctiva/sclera: Conjunctivae normal.  Neck:     Musculoskeletal: Neck supple.  Cardiovascular:     Rate and Rhythm: Normal rate and regular rhythm.     Heart sounds: No murmur.  Pulmonary:     Effort: Pulmonary effort is normal. No respiratory distress.     Breath sounds: Wheezing present.     Comments: Inspiratory and expiratory wheezing throughout bilateral lung sounds, rhonchi present as well Breathing comfortably at rest Abdominal:     Palpations: Abdomen is soft.     Tenderness: There is no abdominal tenderness.  Skin:    General: Skin is warm and dry.  Neurological:     Mental Status: She is alert.      UC Treatments / Results  Labs (all labs ordered are listed, but only abnormal results are displayed) Labs Reviewed - No data to display  EKG None  Radiology Dg Chest 2 View  Result Date: 10/02/2018 CLINICAL DATA:  Dry cough only productive in the morning w yellow sputum, soreness in sternal area x 3 weeks. Pt notes a crackle in lungs when laying down, and a warmness in chest today. Hx of asthma, no known heart conditions. Nonsmoker. EXAM: CHEST - 2 VIEW COMPARISON:  01/03/2018 FINDINGS:  The heart size and mediastinal contours are within normal limits. Both lungs are clear. The visualized skeletal structures are unremarkable. IMPRESSION: No active cardiopulmonary disease. Electronically Signed   By: Nolon Nations M.D.   On: 10/02/2018 16:17    Procedures Procedures (including critical care time)  Medications Ordered in UC Medications  ipratropium-albuterol (DUONEB) 0.5-2.5 (3) MG/3ML nebulizer solution 3 mL (3 mLs Nebulization Given 10/02/18 1612)    Initial Impression / Assessment and Plan / UC Course  I have reviewed the triage vital signs and the nursing notes.  Pertinent labs & imaging results that were available during my care of the patient were reviewed by me and considered in my medical decision making (see chart for details).  Clinical Course as of Oct 02 1645  Fri Oct 02, 2018  1623 DG Chest 2 View [HW]    Clinical Course User Index [HW] Marialuiza Car, [place] C, [place]    Chest x-ray negative for pneumonia.  DuoNeb provided in clinic.  Will treat for asthma exacerbation/bronchitis.  Prednisone daily for 5 days, restart using Qvar twice daily as prescribed.  Inhalers refilled.  Will provide azithromycin to cover atypical respiratory illness given symptoms for 3 weeks.  Continue Zyrtec.  Cough syrup as needed.Discussed strict return precautions. Patient verbalized understanding and is agreeable with plan.  Final Clinical Impressions(s) / UC Diagnoses   Final diagnoses:  Acute bronchitis, unspecified organism     Discharge Instructions     No pneumonia on x-ray Please begin prednisone daily for the next 5 days, please take with food in the morning if you are able, this should  help with wheezing, shortness of breath and inflammation in lungs Since her symptoms have been going on for 3 weeks I am still going to go ahead and give you an antibiotic, please begin azithromycin-2 tablets today, 1 tablet for the following 4 days I have sent in a refill of your  albuterol and Qvar inhaler.  Please contact your PCP for further refills Continue daily Zyrtec May use cough syrup provided as needed  To monitor breathing, temperature and symptoms, please follow-up if symptoms persisting, not improving with above recommendations or worsening   ED Prescriptions    Medication Sig Dispense Auth. Provider   predniSONE (DELTASONE) 50 MG tablet  (Status: Discontinued) Take 1 tablet (50 mg total) by mouth daily with breakfast for 5 days. 5 tablet Casin Federici C, PA-C   azithromycin (ZITHROMAX) 250 MG tablet  (Status: Discontinued) Take 1 tablet (250 mg total) by mouth daily. Take first 2 tablets together, then 1 every day until finished. 6 tablet Michi Herrmann C, PA-C   brompheniramine-pseudoephedrine-DM 30-2-10 MG/5ML syrup  (Status: Discontinued) Take 5 mLs by mouth 4 (four) times daily as needed. 120 mL Sherlene Rickel C, PA-C   beclomethasone (QVAR) 40 MCG/ACT inhaler  (Status: Discontinued) INHALE TWO PUFFS INTO THE LUNGS TWO TIMES DAILY 1 Inhaler Rozelle Caudle C, PA-C   albuterol (PROVENTIL HFA;VENTOLIN HFA) 108 (90 Base) MCG/ACT inhaler  (Status: Discontinued) Inhale 2 puffs into the lungs every 6 (six) hours as needed for wheezing or shortness of breath. 18 g Ivin Rosenbloom C, PA-C   predniSONE (DELTASONE) 50 MG tablet Take 1 tablet (50 mg total) by mouth daily with breakfast for 5 days. 5 tablet Jonnette Nuon C, PA-C   albuterol (PROVENTIL HFA;VENTOLIN HFA) 108 (90 Base) MCG/ACT inhaler Inhale 2 puffs into the lungs every 6 (six) hours as needed for wheezing or shortness of breath. 18 g Lanetra Hartley C, PA-C   azithromycin (ZITHROMAX) 250 MG tablet Take 1 tablet (250 mg total) by mouth daily. Take first 2 tablets together, then 1 every day until finished. 6 tablet Mackinze Criado C, PA-C   beclomethasone (QVAR) 40 MCG/ACT inhaler INHALE TWO PUFFS INTO THE LUNGS TWO TIMES DAILY 1 Inhaler Wendelin Bradt C, PA-C   brompheniramine-pseudoephedrine-DM  30-2-10 MG/5ML syrup Take 5 mLs by mouth 4 (four) times daily as needed. 120 mL Sherill Wegener C, PA-C     Controlled Substance Prescriptions [place] Controlled Substance Registry consulted? Not Applicable   Janith Lima, [place] 10/02/18 1648

## 2018-10-02 NOTE — Telephone Encounter (Signed)
Pt called with complaints of wheezing for    2 weeks; she states that she has an intermittent cough; the pt says that sometimes she has to come in for a breathing treatment; recommendations made per nurse triage protocol; the pt would like to be seen in the office today informed pt that [place] is no longer at practice but other providers are available    but there is no availability in the office today; pt directed to urgent care or ED; she verbalizes understanding and will proceed. Reason for Disposition . [1] MODERATE longstanding difficulty breathing (e.g., speaks in phrases, SOB even at rest, pulse 100-120) AND [2] SAME as normal  Answer Assessment - Initial Assessment Questions 1. RESPIRATORY STATUS: "Describe your breathing?" (e.g., wheezing, shortness of breath, unable to speak, severe coughing)      Wheezing  2. ONSET: "When did this breathing problem begin?"      2 weeks ago 3. PATTERN "Does the difficult breathing come and go, or has it been constant since it started?"      Happens mainly when she is sleeping  4. SEVERITY: "How bad is your breathing?" (e.g., mild, moderate, severe)    - MILD: No SOB at rest, mild SOB with walking, speaks normally in sentences, can lay down, no retractions, pulse < 100.    - MODERATE: SOB at rest, SOB with minimal exertion and prefers to sit, cannot lie down flat, speaks in phrases, mild retractions, audible wheezing, pulse 100-120.    - SEVERE: Very SOB at rest, speaks in single words, struggling to breathe, sitting hunched forward, retractions, pulse > 120      mild 5. RECURRENT SYMPTOM: "Have you had difficulty breathing before?" If so, ask: "When was the last time?" and "What happened that time?"      Yes history of asthma; a month or two ago 6. CARDIAC HISTORY: "Do you have any history of heart disease?" (e.g., heart attack, angina, bypass surgery, angioplasty)      no 7. LUNG HISTORY: "Do you have any history of lung disease?"  (e.g., pulmonary  embolus, asthma, emphysema)     asthma 8. CAUSE: "What do you think is causing the breathing problem?"      asthma 9. OTHER SYMPTOMS: "Do you have any other symptoms? (e.g., dizziness, runny nose, cough, chest pain, fever)     Intermittent cough 10. PREGNANCY: "Is there any chance you are pregnant?" "When was your last menstrual period?"       No LMP 09/28/18 11. TRAVEL: "Have you traveled out of the country in the last month?" (e.g., travel history, exposures)       no  Protocols used: BREATHING DIFFICULTY-A-AH

## 2018-10-04 DIAGNOSIS — Z7951 Long term (current) use of inhaled steroids: Secondary | ICD-10-CM | POA: Diagnosis not present

## 2018-10-04 DIAGNOSIS — R2242 Localized swelling, mass and lump, left lower limb: Secondary | ICD-10-CM | POA: Diagnosis not present

## 2018-10-04 DIAGNOSIS — R05 Cough: Secondary | ICD-10-CM | POA: Diagnosis not present

## 2018-10-04 DIAGNOSIS — M7989 Other specified soft tissue disorders: Secondary | ICD-10-CM | POA: Diagnosis not present

## 2018-10-04 DIAGNOSIS — R062 Wheezing: Secondary | ICD-10-CM | POA: Diagnosis not present

## 2018-10-12 ENCOUNTER — Ambulatory Visit (INDEPENDENT_AMBULATORY_CARE_PROVIDER_SITE_OTHER): Payer: Federal, State, Local not specified - PPO | Admitting: Podiatry

## 2018-10-12 ENCOUNTER — Ambulatory Visit (INDEPENDENT_AMBULATORY_CARE_PROVIDER_SITE_OTHER): Payer: Federal, State, Local not specified - PPO

## 2018-10-12 DIAGNOSIS — M84374D Stress fracture, right foot, subsequent encounter for fracture with routine healing: Secondary | ICD-10-CM | POA: Diagnosis not present

## 2018-10-12 DIAGNOSIS — R609 Edema, unspecified: Secondary | ICD-10-CM

## 2018-10-12 NOTE — Progress Notes (Addendum)
Subjective: 43 year old female presents the office today for follow-up evaluation of a stress fracture to her right foot.  Overall she states that she is doing much better.  She has been wearing the surgical shoe.  She has some very intermittent discomfort but overall she is feeling much better and she had some minimal swelling.  She states that she is ready to go back into a regular shoe.  Denies any recent injury or falls or any changes since I last saw her and she has no new concerns.  Objective: AAO x3, NAD DP/PT pulses palpable bilaterally, CRT less than 3 seconds There is trace residual swelling to the right foot but there is no area pinpoint tenderness identified this time.  Overall she is doing much better and the pain has resolved.  No other areas of tenderness. No open lesions or pre-ulcerative lesions.  No pain with calf compression, swelling, warmth, erythema  Assessment: Stress fracture right foot third metatarsal-with improvement  Plan: -All treatment options discussed with the patient including all alternatives, risks, complications.  -Repeat x-rays were obtained reviewed.  Periosteal reaction along the third metatarsal neck and evidence of increased healing.  No evidence of acute fracture identified otherwise. -At this point she is doing well she can slowly transition back into her wear a regular shoe.  We discussed how to do this today.  I want her to wear the cam boot at work for the next couple days and as she is doing better wearing a regular shoe she can slowly incorporate that as well.  When she is back into regular shoe she can return back to her full duty at work. -Compression anklet dispensed -We discussed orthotics.  I will see her back in 4 weeks if needed and at that point we will evaluate for possible orthotics.  Trula Slade DPM

## 2018-10-13 ENCOUNTER — Ambulatory Visit: Payer: Self-pay | Admitting: Nurse Practitioner

## 2018-10-15 ENCOUNTER — Encounter: Payer: Self-pay | Admitting: Family Medicine

## 2018-10-19 ENCOUNTER — Encounter: Payer: Self-pay | Admitting: Physician Assistant

## 2018-10-19 ENCOUNTER — Encounter: Payer: Self-pay | Admitting: Family Medicine

## 2018-10-19 ENCOUNTER — Ambulatory Visit (INDEPENDENT_AMBULATORY_CARE_PROVIDER_SITE_OTHER): Payer: Federal, State, Local not specified - PPO | Admitting: Family Medicine

## 2018-10-19 VITALS — BP 115/71 | HR 122 | Temp 98.1°F | Resp 17 | Ht 61.5 in | Wt 212.6 lb

## 2018-10-19 DIAGNOSIS — R030 Elevated blood-pressure reading, without diagnosis of hypertension: Secondary | ICD-10-CM

## 2018-10-19 DIAGNOSIS — R600 Localized edema: Secondary | ICD-10-CM | POA: Diagnosis not present

## 2018-10-19 DIAGNOSIS — R009 Unspecified abnormalities of heart beat: Secondary | ICD-10-CM | POA: Diagnosis not present

## 2018-10-19 DIAGNOSIS — Z7689 Persons encountering health services in other specified circumstances: Secondary | ICD-10-CM

## 2018-10-19 DIAGNOSIS — M7989 Other specified soft tissue disorders: Secondary | ICD-10-CM

## 2018-10-19 DIAGNOSIS — E559 Vitamin D deficiency, unspecified: Secondary | ICD-10-CM

## 2018-10-19 MED ORDER — VITAMIN D (ERGOCALCIFEROL) 1.25 MG (50000 UNIT) PO CAPS: 50000.0000 [IU] | ORAL_CAPSULE | ORAL | 0 refills | Status: AC

## 2018-10-19 MED ORDER — POTASSIUM CHLORIDE CRYS ER 20 MEQ PO TBCR: 20.0000 meq | EXTENDED_RELEASE_TABLET | Freq: Every day | ORAL | 0 refills | Status: AC

## 2018-10-19 MED ORDER — FUROSEMIDE 20 MG PO TABS: 20.0000 mg | ORAL_TABLET | Freq: Every day | ORAL | 0 refills | Status: AC

## 2018-10-19 NOTE — Progress Notes (Addendum)
Sherri Buckley, is a 43 y.o. female  VXB:[id number]  [id number]:[id number]  DOB - May 31, 1976  CC:  Chief Complaint  Patient presents with  . Establish Care  . Follow-up    uc 12/27: acute bronchitis. feeling better. no cough, SHOB or wheezing       HPI: Sherri Buckley is a 43 y.o. female is here today to establish care, lower extremity swelling, left arm swelling, and asthma  AIRLIE BLUMENBERG has Hypovitaminosis D; Alcohol abuse; Nipple discharge; Allergic rhinitis; Elevated troponin; Palpitation; Asthma; BMI 36.0-36.9,adult; Anxiety about health; Seasonal and perennial allergic rhinitis; Chronic urticaria; Mild persistent asthma, uncomplicated; Dysfunctional uterine bleeding; Irregular intermenstrual bleeding; History of substance use; Arthritis; Breast lump; Chronic nonintractable headache; Galactorrhea not associated with childbirth; Hypertensive disorder; Menorrhagia; Nasal turbinate hypertrophy; Osteoarthritis of right foot; Pain of breast; and Stress fracture, right foot, initial encounter for fracture on their problem list.    Patient is here today to establish care.  She was referred here for primary care after a visit at the [facility] on 10/02/2018 for acute bronchitis which is since resolved. She subsequently presented to [facility] on 10/04/2018 with a complaint of left arm swelling. She underwent a CTA to rule out PE as she was also complaining of cough. Doppler study were negative.  She complains that arm swelling has not improved.  She has not attempted relief with any compression sleeve.  She also complains of chronic persistent lower leg swelling in which she has previously been referred to vascular for evaluation although admits she never followed up with referral.  Leg swelling is localized to the upper thighs.  She suffers from varicose veins therefore her prior PCP felt it was warranted to have her follow-up for additional vascular studies.  She is obese the current Body  mass index is 39.52 kg/m.  She admits that she does not like to take medication therefore has not consistently tried any diuretic therapy to resolve the swelling.  She works for the [employer] center and is able to sit during work as well as stand up, therefore note to prolonged standing or sitting is not the cause of swelling.  She had recently started exercise although had to stop after sustaining an ankle fracture for which she has been followed by orthopedics at present.  Denies any history of any CHF or hypertension.  Swelling has been attributed to lymphedema. Patient denies chest pain, abdominal pain, nausea, new weakness , numbness or tingling, or SOB.  Tachycardia Patient has a long history of elevated HR. Previously followed by cardiology, wore a Holter monitor and underwent a stress test which all were normal.  Heart rate is elevated during today's visit although she is in no distress.  Heart rate tach up to 122 max and  fluctuates between 99 and 110 at rest. Reports occasional palpitations. No chest pain or shortness of breath.  Current medications: Current Outpatient Medications:  .  albuterol (PROVENTIL HFA;VENTOLIN HFA) 108 (90 Base) MCG/ACT inhaler, Inhale 2 puffs into the lungs every 6 (six) hours as needed for wheezing or shortness of breath., Disp: 18 g, Rfl: 1 .  beclomethasone (QVAR) 40 MCG/ACT inhaler, INHALE TWO PUFFS INTO THE LUNGS TWO TIMES DAILY, Disp: 1 Inhaler, Rfl: 4 .  cetirizine (ZYRTEC) 10 MG tablet, Take 1 tablet (10 mg total) by mouth daily., Disp: 90 tablet, Rfl: 3 .  fluticasone (FLONASE) 50 MCG/ACT nasal spray, Place 2 sprays into both nostrils daily., Disp: 16 g, Rfl: 6 .  Ibuprofen (  ADVIL) 200 MG CAPS, Take 1-2 capsules by mouth as needed. , Disp: , Rfl:  .  Multiple Vitamins-Minerals (MULTIVITAMIN ADULT PO), Take 1 tablet by mouth daily. , Disp: , Rfl:    Pertinent family medical history: family history includes Alcohol abuse in her father;  Anemia in her mother; Arthritis in her maternal aunt; Cancer - Other in her maternal grandmother; Diabetes in her mother and paternal grandmother; Heart disease in her maternal grandmother; Hernia in her mother; Hypertension in her mother and paternal grandmother; Migraines in her mother; Seizures in her father; Sickle cell trait in her father; Thyroid disease in her mother.   No Known Allergies  Social History   Socioeconomic History  . Marital status: Single    Spouse name: Not on file  . Number of children: 0  . Years of education: Not on file  . Highest education level: Not on file  Occupational History  . Occupation: employed    Fish farm manager: [employer]    Comment: Teacher, early years/pre  Social Needs  . Financial resource strain: Not hard at all  . Food insecurity:    Worry: Never true    Inability: Never true  . Transportation needs:    Medical: No    Non-medical: No  Tobacco Use  . Smoking status: Former Research scientist (life sciences)  . Smokeless tobacco: Never Used  . Tobacco comment: black and milds  Substance and Sexual Activity  . Alcohol use: Yes    Alcohol/week: 8.0 standard drinks    Types: 8 Shots of liquor per week  . Drug use: Not Currently    Types: Marijuana    Comment: "quit smoking marijuana in 2013"  . Sexual activity: Not Currently  Lifestyle  . Physical activity:    Days per week: 0 days    Minutes per session: 0 min  . Stress: Not on file  Relationships  . Social connections:    Talks on phone: More than three times a week    Gets together: Never    Attends religious service: More than 4 times per year    Active member of club or organization: Yes    Attends meetings of clubs or organizations: More than 4 times per year    Relationship status: Never married  . Intimate partner violence:    Fear of current or ex partner: Patient refused    Emotionally abused: Patient refused    Physically abused: Patient refused    Forced sexual activity: Patient refused  Other  Topics Concern  . Not on file  Social History Narrative   Marital status: single; not dating      Children:  None      Lives: alone in house.      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        Tobacco:  Cigars rarely in 2018.      Alcohol: drinks beer 5 per week;  previous alcoholism; stopped drinking heavily 2014; previous counseling; went through EAP with work.      Drugs:  None; h/o marijuana; rare use now.      Exercise:  None      Seatbelt:  100%      Guns:  None      Sexual activity:  Total partners = 25; history of crabs; dates males only.  Last STD screening 4 years ago.      Review of Systems: Pertinent negatives listed in  HPI Objective:   Vitals:   10/19/18 1341  BP: 115/71  Pulse: (!) 122  Resp: 17  Temp: 98.1 F (36.7 C)  SpO2: 96%    BP Readings from Last 3 Encounters:  10/19/18 115/71  10/02/18 138/88  08/24/18 125/75    Filed Weights   10/19/18 1341  Weight: 212 lb 9.6 oz (96.4 kg)      Physical Exam: Constitutional: Patient appears well-developed and well-nourished. No distress. HENT: Normocephalic, atraumatic, External right and left ear normal.  Eyes: Conjunctivae and EOM are normal. PERRLA, no scleral icterus. Neck: Normal ROM. Neck supple. No JVD. No tracheal deviation. No thyromegaly. CVS: Tacycardia,  no murmurs, no gallops, no carotid bruit.  Pulmonary: Effort and breath sounds normal, no stridor, rhonchi, wheezes, rales.  Abdominal: Soft. BS +, no distension, tenderness, rebound or guarding.  Musculoskeletal: Normal range of motion.BUE and BLE non-pitting edema. Non-painful with palpation.  Neuro: Alert. Normal muscle tone coordination. Normal gait. BUE and BLE strength 5/5.  Skin: Skin is warm and dry. No rash noted. Not diaphoretic. No erythema. No pallor. Psychiatric: Normal mood and affect. Behavior, judgment, thought content normal.  Lab Results  Component Value Date    WBC 9.0 08/17/2018   HGB 12.2 08/17/2018   HCT 37.2 08/17/2018   MCV 81 08/17/2018   PLT 420 08/17/2018   Lab Results  Component Value Date   CREATININE 0.78 08/17/2018   BUN 10 08/17/2018   NA 137 08/17/2018   K 3.8 08/17/2018   CL 102 08/17/2018   CO2 22 08/17/2018    Lab Results  Component Value Date   HGBA1C 4.8 06/11/2017       Component Value Date/Time   CHOL 166 08/17/2018 1417   TRIG 82 08/17/2018 1417   HDL 59 08/17/2018 1417   CHOLHDL 2.8 08/17/2018 1417   CHOLHDL 3.4 12/22/2014 0535   VLDL 9 12/22/2014 0535   LDLCALC 91 08/17/2018 1417        Assessment and plan:  1. Encounter to establish care 2. Elevated heart rate with elevated blood pressure without diagnosis of hypertension - EKG 12-Lead, Sinus Tachycardia 101, negative T wave changes -Checking potassium level, if normal. May consider starting patient on a low dose propanol or metoprolol to reduce HR  3. Vitamin D deficiency -Recently checked by prior provider and level was low. Will start 50 units of ergocalciferol once weekly for vitamin D replacement.  Encourage patient to purchase OTC calcium supplements as she suffered a recent fracture and admits to poor dairy and vitamin D intake.  4. Bilateral lower extremity edema 5. Left arm swelling -Recommended 20 mmHg weighted compression tights and compression sleeve to reduce swelling.  Will trial Lasix 20 mg once weekly with potassium replacement with each dose for 2 weeks.  Patient will return in 2 weeks for follow-up evaluation of leg swelling if improvement will consider continuing therapy versus referring patient out to vascular for further evaluation.  Return in about 2 weeks (around 11/02/2018) for leg swelling.   The patient was given clear instructions to go to ER or return to medical center if symptoms don't improve, worsen or new problems develop. The patient verbalized understanding. The patient was advised  to call and obtain lab results if  they haven't heard anything from out office within 7-10 business days.  Molli Barrows, FNP Primary Care at [facility] [address] [place] [address] [phone]: [phone]    This note has been created with Dragon speech recognition software  and smart Company secretary. Any transcriptional errors are unintentional.

## 2018-10-19 NOTE — Patient Instructions (Addendum)
Thank you for choosing Primary Care at [facility] to be your medical home!    Sherri Buckley was seen by Molli Barrows, FNP today.   Desma Paganini primary care provider is Scot Jun, FNP.   For the best care possible, you should try to see Molli Barrows, FNP-C whenever you come to the clinic.   We look forward to seeing you again soon!  If you have any questions about your visit today, please call us at [phone] or feel free to reach your primary care provider via [place].       Edema  Edema is when you have too much fluid in your body or under your skin. Edema may make your legs, feet, and ankles swell up. Swelling is also common in looser tissues, like around your eyes. This is a common condition. It gets more common as you get older. There are many possible causes of edema. Eating too much salt (sodium) and being on your feet or sitting for a long time can cause edema in your legs, feet, and ankles. Hot weather may make edema worse. Edema is usually painless. Your skin may look swollen or shiny. Follow these instructions at home:  Keep the swollen body part raised (elevated) above the level of your heart when you are sitting or lying down.  Do not sit still or stand for a long time.  Do not wear tight clothes. Do not wear garters on your upper legs.  Exercise your legs. This can help the swelling go down.  Wear elastic bandages or support stockings as told by your doctor.  Eat a low-salt (low-sodium) diet to reduce fluid as told by your doctor.  Depending on the cause of your swelling, you may need to limit how much fluid you drink (fluid restriction).  Take over-the-counter and prescription medicines only as told by your doctor. Contact a doctor if:  Treatment is not working.  You have heart, liver, or kidney disease and have symptoms of edema.  You have sudden and unexplained weight gain. Get help right away if:  You have shortness of  breath or chest pain.  You cannot breathe when you lie down.  You have pain, redness, or warmth in the swollen areas.  You have heart, liver, or kidney disease and get edema all of a sudden.  You have a fever and your symptoms get worse all of a sudden. Summary  Edema is when you have too much fluid in your body or under your skin.  Edema may make your legs, feet, and ankles swell up. Swelling is also common in looser tissues, like around your eyes.  Raise (elevate) the swollen body part above the level of your heart when you are sitting or lying down.  Follow your doctor's instructions about diet and how much fluid you can drink (fluid restriction). This information is not intended to replace advice given to you by your health care provider. Make sure you discuss any questions you have with your health care provider. Document Released: 03/11/2008 Document Revised: 10/11/2016 Document Reviewed: 10/11/2016 Elsevier Interactive Patient Education  2019 [place] protect organs, store calcium, anchor muscles, and support the whole body. Keeping your bones strong is important, especially as you get older. You can take actions to help keep your bones strong and healthy. Why is keeping my bones healthy important?  Keeping your bones healthy is important because your body constantly replaces bone cells. Cells get old, and  new cells take their place. As we age, we lose bone cells because the body may not be able to make enough new cells to replace the old cells. The amount of bone cells and bone tissue you have is referred to as bone mass. The higher your bone mass, the stronger your bones. The aging process leads to an overall loss of bone mass in the body, which can increase the likelihood of:  Joint pain and stiffness.  Broken bones.  A condition in which the bones become weak and brittle (osteoporosis). A large decline in bone mass occurs in older adults. In  women, it occurs about the time of menopause. What actions can I take to keep my bones healthy? Good health habits are important for maintaining healthy bones. This includes eating nutritious foods and exercising regularly. To have healthy bones, you need to get enough of the right minerals and vitamins. Most nutrition experts recommend getting these nutrients from the foods that you eat. In some cases, taking supplements may also be recommended. Doing certain types of exercise is also important for bone health. What are the nutritional recommendations for healthy bones?  Eating a well-balanced diet with plenty of calcium and vitamin D will help to protect your bones. Nutritional recommendations vary from person to person. Ask your health care provider what is healthy for you. Here are some general guidelines. Get enough calcium Calcium is the most important (essential) mineral for bone health. Most people can get enough calcium from their diet, but supplements may be recommended for people who are at risk for osteoporosis. Good sources of calcium include:  Dairy products, such as low-fat or nonfat milk, cheese, and yogurt.  Dark green leafy vegetables, such as bok choy and broccoli.  Calcium-fortified foods, such as orange juice, cereal, bread, soy beverages, and tofu products.  Nuts, such as almonds. Follow these recommended amounts for daily calcium intake:  Children, age 84-3: 700 mg.  Children, age 36-8: 1,000 mg.  Children, age 69-13: 1,300 mg.  Teens, age 60-18: 1,300 mg.  Adults, age 29-50: 1,000 mg.  Adults, age 76-70: ? Men: 1,000 mg. ? Women: 1,200 mg.  Adults, age 36 or older: 1,200 mg.  Pregnant and breastfeeding females: ? Teens: 1,300 mg. ? Adults: 1,000 mg. Get enough vitamin D Vitamin D is the most essential vitamin for bone health. It helps the body absorb calcium. Sunlight stimulates the skin to make vitamin D, so be sure to get enough sunlight. If you live in a  cold climate or you do not get outside often, your health care provider may recommend that you take vitamin D supplements. Good sources of vitamin D in your diet include:  Egg yolks.  Saltwater fish.  Milk and cereal fortified with vitamin D. Follow these recommended amounts for daily vitamin D intake:  Children and teens, age 84-18: 600 international units.  Adults, age 8 or younger: 400-800 international units.  Adults, age 65 or older: 800-1,000 international units. Get other important nutrients Other nutrients that are important for bone health include:  Phosphorus. This mineral is found in meat, poultry, dairy foods, nuts, and legumes. The recommended daily intake for adult men and adult women is 700 mg.  Magnesium. This mineral is found in seeds, nuts, dark green vegetables, and legumes. The recommended daily intake for adult men is 400-420 mg. For adult women, it is 310-320 mg.  Vitamin K. This vitamin is found in green leafy vegetables. The recommended daily intake is 120 mg  for adult men and 90 mg for adult women. What type of physical activity is best for building and maintaining healthy bones? Weight-bearing and strength-building activities are important for building and maintaining healthy bones. Weight-bearing activities cause muscles and bones to work against gravity. Strength-building activities increase the strength of the muscles that support bones. Weight-bearing and muscle-building activities include:  Walking and hiking.  Jogging and running.  Dancing.  Gym exercises.  Lifting weights.  Tennis and racquetball.  Climbing stairs.  Aerobics. Adults should get at least 30 minutes of moderate physical activity on most days. Children should get at least 60 minutes of moderate physical activity on most days. Ask your health care provider what type of exercise is best for you. How can I find out if my bone mass is low? Bone mass can be measured with an X-ray test  called a bone mineral density (BMD) test. This test is recommended for all women who are age 15 or older. It may also be recommended for:  Men who are age 80 or older.  People who are at risk for osteoporosis because of: ? Having bones that break easily. ? Having a long-term disease that weakens bones, such as kidney disease or rheumatoid arthritis. ? Having menopause earlier than normal. ? Taking medicine that weakens bones, such as steroids, thyroid hormones, or hormone treatment for breast cancer or prostate cancer. ? Smoking. ? Drinking three or more alcoholic drinks a day. If you find that you have a low bone mass, you may be able to prevent osteoporosis or further bone loss by changing your diet and lifestyle. Where can I find more information? For more information, check out the following websites:  [place]: AviationTales.fr  [employer] of Health: www.bones.SouthExposed.es  International Osteoporosis Foundation: Administrator.iofbonehealth.org Summary  The aging process leads to an overall loss of bone mass in the body, which can increase the likelihood of broken bones and osteoporosis.  Eating a well-balanced diet with plenty of calcium and vitamin D will help to protect your bones.  Weight-bearing and strength-building activities are also important for building and maintaining strong bones.  Bone mass can be measured with an X-ray test called a bone mineral density (BMD) test. This information is not intended to replace advice given to you by your health care provider. Make sure you discuss any questions you have with your health care provider. Document Released: 12/14/2003 Document Revised: 10/20/2017 Document Reviewed: 10/20/2017 Elsevier Interactive Patient Education  2019 [employer].

## 2018-10-20 NOTE — Telephone Encounter (Signed)
Hello Anderson Malta.  The weight of compression wear I recommend 20-30 mmHg.    Kindest Regards,  Molli Barrows, FNP

## 2018-10-28 ENCOUNTER — Encounter: Payer: Self-pay | Admitting: Podiatry

## 2018-10-28 ENCOUNTER — Telehealth: Payer: Self-pay | Admitting: *Deleted

## 2018-11-03 ENCOUNTER — Ambulatory Visit: Payer: Self-pay | Admitting: Family Medicine

## 2018-11-03 DIAGNOSIS — N6323 Unspecified lump in the left breast, lower outer quadrant: Secondary | ICD-10-CM | POA: Diagnosis not present

## 2018-11-04 ENCOUNTER — Other Ambulatory Visit: Payer: Self-pay | Admitting: Obstetrics and Gynecology

## 2018-11-04 DIAGNOSIS — N6320 Unspecified lump in the left breast, unspecified quadrant: Secondary | ICD-10-CM

## 2018-11-04 NOTE — Telephone Encounter (Signed)
Entered in error

## 2018-11-05 ENCOUNTER — Ambulatory Visit: Payer: Self-pay | Admitting: Family Medicine

## 2018-11-06 ENCOUNTER — Ambulatory Visit: Payer: Federal, State, Local not specified - PPO | Admitting: Podiatry

## 2018-11-09 DIAGNOSIS — K080 Exfoliation of teeth due to systemic causes: Secondary | ICD-10-CM | POA: Diagnosis not present

## 2018-11-10 ENCOUNTER — Ambulatory Visit
Admission: RE | Admit: 2018-11-10 | Discharge: 2018-11-10 | Disposition: A | Discharge status: Home / Self Care | Payer: Federal, State, Local not specified - PPO | Source: Ambulatory Visit | Attending: Obstetrics and Gynecology | Admitting: Obstetrics and Gynecology

## 2018-11-10 DIAGNOSIS — R922 Inconclusive mammogram: Secondary | ICD-10-CM | POA: Diagnosis not present

## 2018-11-10 DIAGNOSIS — N6320 Unspecified lump in the left breast, unspecified quadrant: Secondary | ICD-10-CM

## 2018-11-10 DIAGNOSIS — N6489 Other specified disorders of breast: Secondary | ICD-10-CM | POA: Diagnosis not present

## 2018-11-16 ENCOUNTER — Ambulatory Visit (INDEPENDENT_AMBULATORY_CARE_PROVIDER_SITE_OTHER): Payer: Federal, State, Local not specified - PPO | Admitting: Podiatry

## 2018-11-16 ENCOUNTER — Encounter: Payer: Self-pay | Admitting: Podiatry

## 2018-11-16 ENCOUNTER — Ambulatory Visit: Payer: Federal, State, Local not specified - PPO | Admitting: Orthotics

## 2018-11-16 DIAGNOSIS — M84374D Stress fracture, right foot, subsequent encounter for fracture with routine healing: Secondary | ICD-10-CM

## 2018-11-16 DIAGNOSIS — M1990 Unspecified osteoarthritis, unspecified site: Secondary | ICD-10-CM

## 2018-11-16 DIAGNOSIS — M7751 Other enthesopathy of right foot: Secondary | ICD-10-CM | POA: Diagnosis not present

## 2018-11-16 DIAGNOSIS — M19071 Primary osteoarthritis, right ankle and foot: Secondary | ICD-10-CM

## 2018-11-16 DIAGNOSIS — M79672 Pain in left foot: Secondary | ICD-10-CM | POA: Diagnosis not present

## 2018-11-16 NOTE — Progress Notes (Signed)

## 2018-11-17 NOTE — Progress Notes (Addendum)
Subjective: 43 year old female presents the office today for follow-up evaluation stress fracture of the right third metatarsal.  She says that overall she is having no issues and her foot is feeling much better.  She is back to regular shoe.  She has been to proceed with orthotics.  She did get orthotics previously from [facility] in about 43 years old.  She got this for tendinitis of her ankle as well as osteoarthritis of her ankle.  She wants to proceed with getting new orthotics. Denies any systemic complaints such as fevers, chills, nausea, vomiting. No acute changes since last appointment, and no other complaints at this time.   Objective: AAO x3, NAD DP/PT pulses palpable bilaterally, CRT less than 3 seconds At this time I am not able to elicit any area of tenderness palpation of the right foot specifically on the third metatarsal.  There is no edema, erythema.  Subjectively she gets occasional discomfort to the ankle that she is been treated for osteoarthritis and she also points the lateral aspect ankle in the course the peroneal tendons where she gets discomfort.  Unable to elicit any tenderness today.  No open lesions or pre-ulcerative lesions.  No pain with calf compression, swelling, warmth, erythema  Assessment: Healed stress fracture right third metatarsal with history of osteoarthritis of the ankle as well as tendinitis of the ankle.  Plan: -All treatment options discussed with the patient including all alternatives, risks, complications.  -Overall from stress fracture standpoint she is doing well is not having any issues.  Discussed supportive shoes and measures to help prevent recurrence.  I held off on any x-rays today as she is walking without any issues and is having no pain on exam. Liliane Channel evaluate her today she was measured for orthotics. -No was provided to her and since she can stand for 1 hour at work and sit for 2 hours.  This was the restriction that were  previously applied by Dr. Doran Durand.  -Patient encouraged to call the office with any questions, concerns, change in symptoms.   Follow-up in 3 weeks to pick up orthotics with Liliane Channel or sooner if needed.  Trula Slade DPM

## 2018-11-30 ENCOUNTER — Ambulatory Visit: Payer: Self-pay | Admitting: Family Medicine

## 2018-12-01 DIAGNOSIS — J4540 Moderate persistent asthma, uncomplicated: Secondary | ICD-10-CM | POA: Diagnosis not present

## 2018-12-01 DIAGNOSIS — E559 Vitamin D deficiency, unspecified: Secondary | ICD-10-CM | POA: Diagnosis not present

## 2018-12-01 DIAGNOSIS — R946 Abnormal results of thyroid function studies: Secondary | ICD-10-CM | POA: Diagnosis not present

## 2018-12-21 ENCOUNTER — Other Ambulatory Visit: Payer: Federal, State, Local not specified - PPO | Admitting: Orthotics

## 2019-01-02 ENCOUNTER — Other Ambulatory Visit: Payer: Self-pay | Admitting: Family Medicine

## 2019-01-02 NOTE — Telephone Encounter (Signed)
Please advise on refill request

## 2019-01-14 ENCOUNTER — Telehealth: Payer: Self-pay | Admitting: Podiatry

## 2019-01-14 NOTE — Telephone Encounter (Signed)
Verified with pt appt cxled for 4.13.2020 and ok to mail orthotics to pt with instructions.  Pt aware if any issues to call to schedule an appt with Liliane Channel.

## 2019-01-18 ENCOUNTER — Other Ambulatory Visit: Payer: Federal, State, Local not specified - PPO | Admitting: Orthotics

## 2019-03-15 IMAGING — DX DG CHEST 2V
2 series · 2 of 2 positions shown · non-contrast
Comparison: December 12, 2015

CLINICAL DATA: Productive cough for 2 days.

EXAM:
CHEST - 2 VIEW

[chest pa]
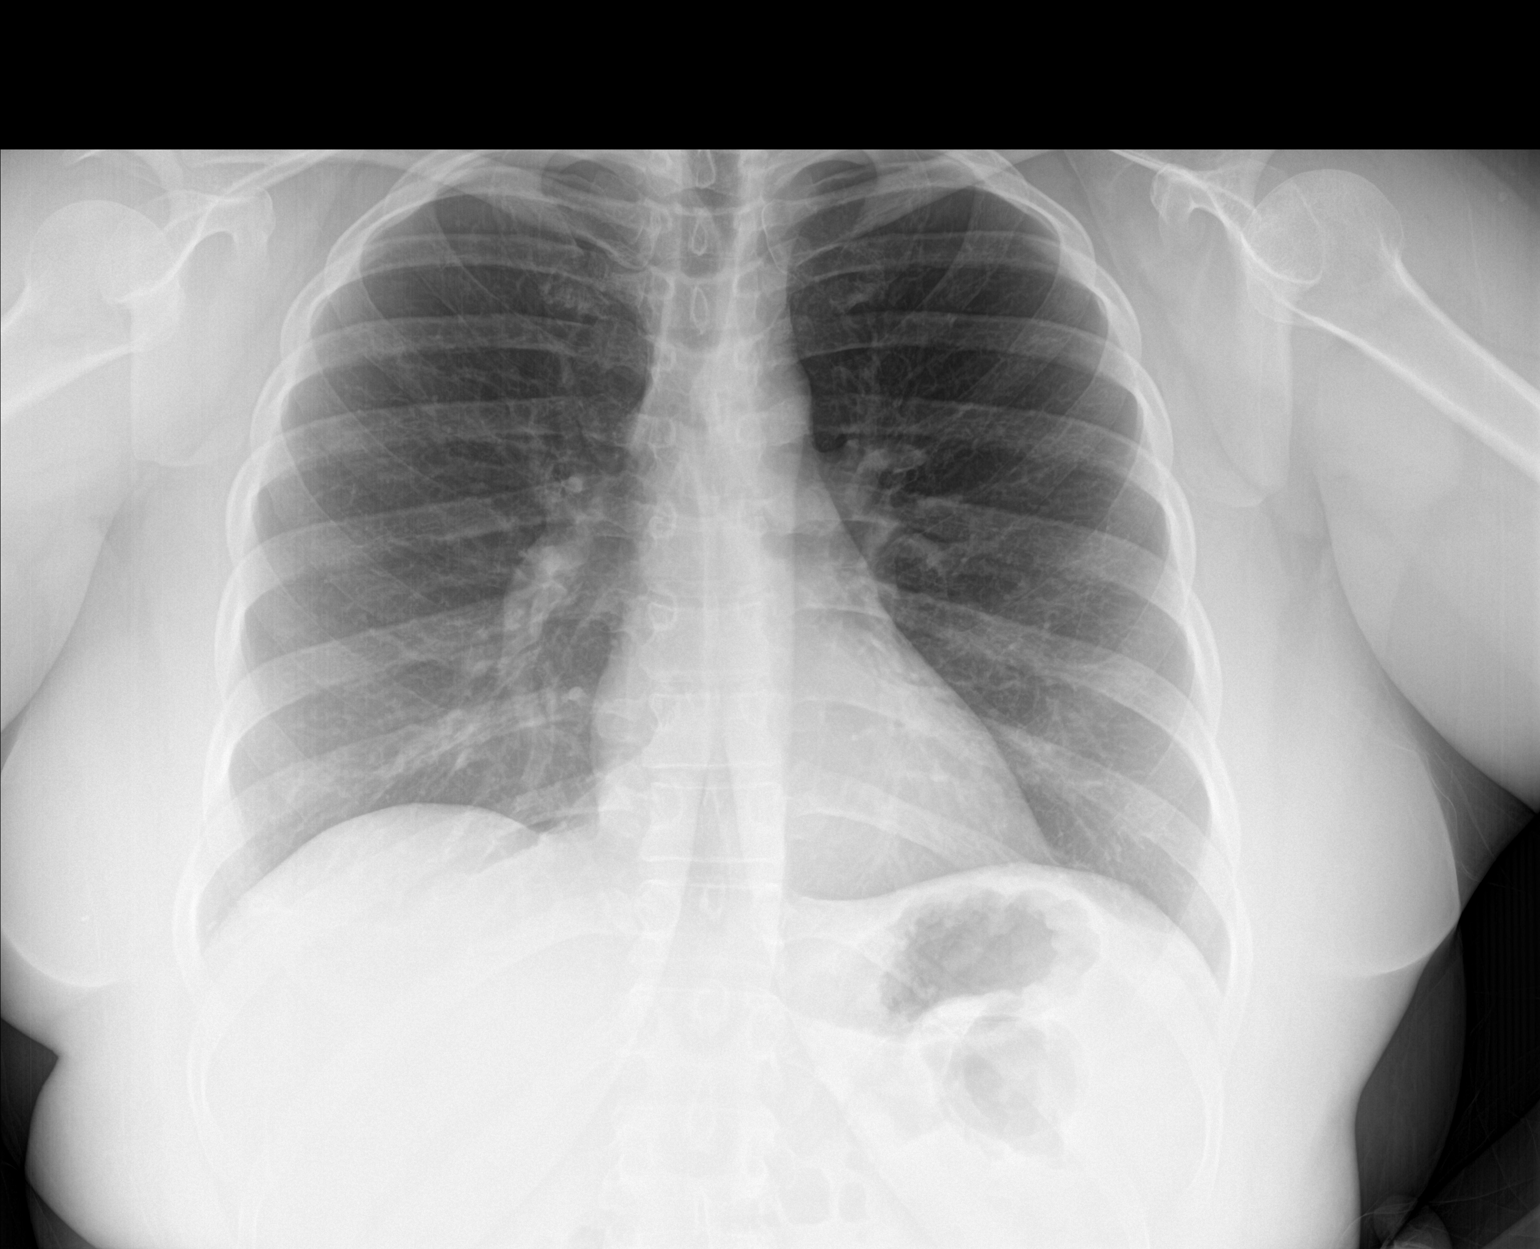

[chest lat]
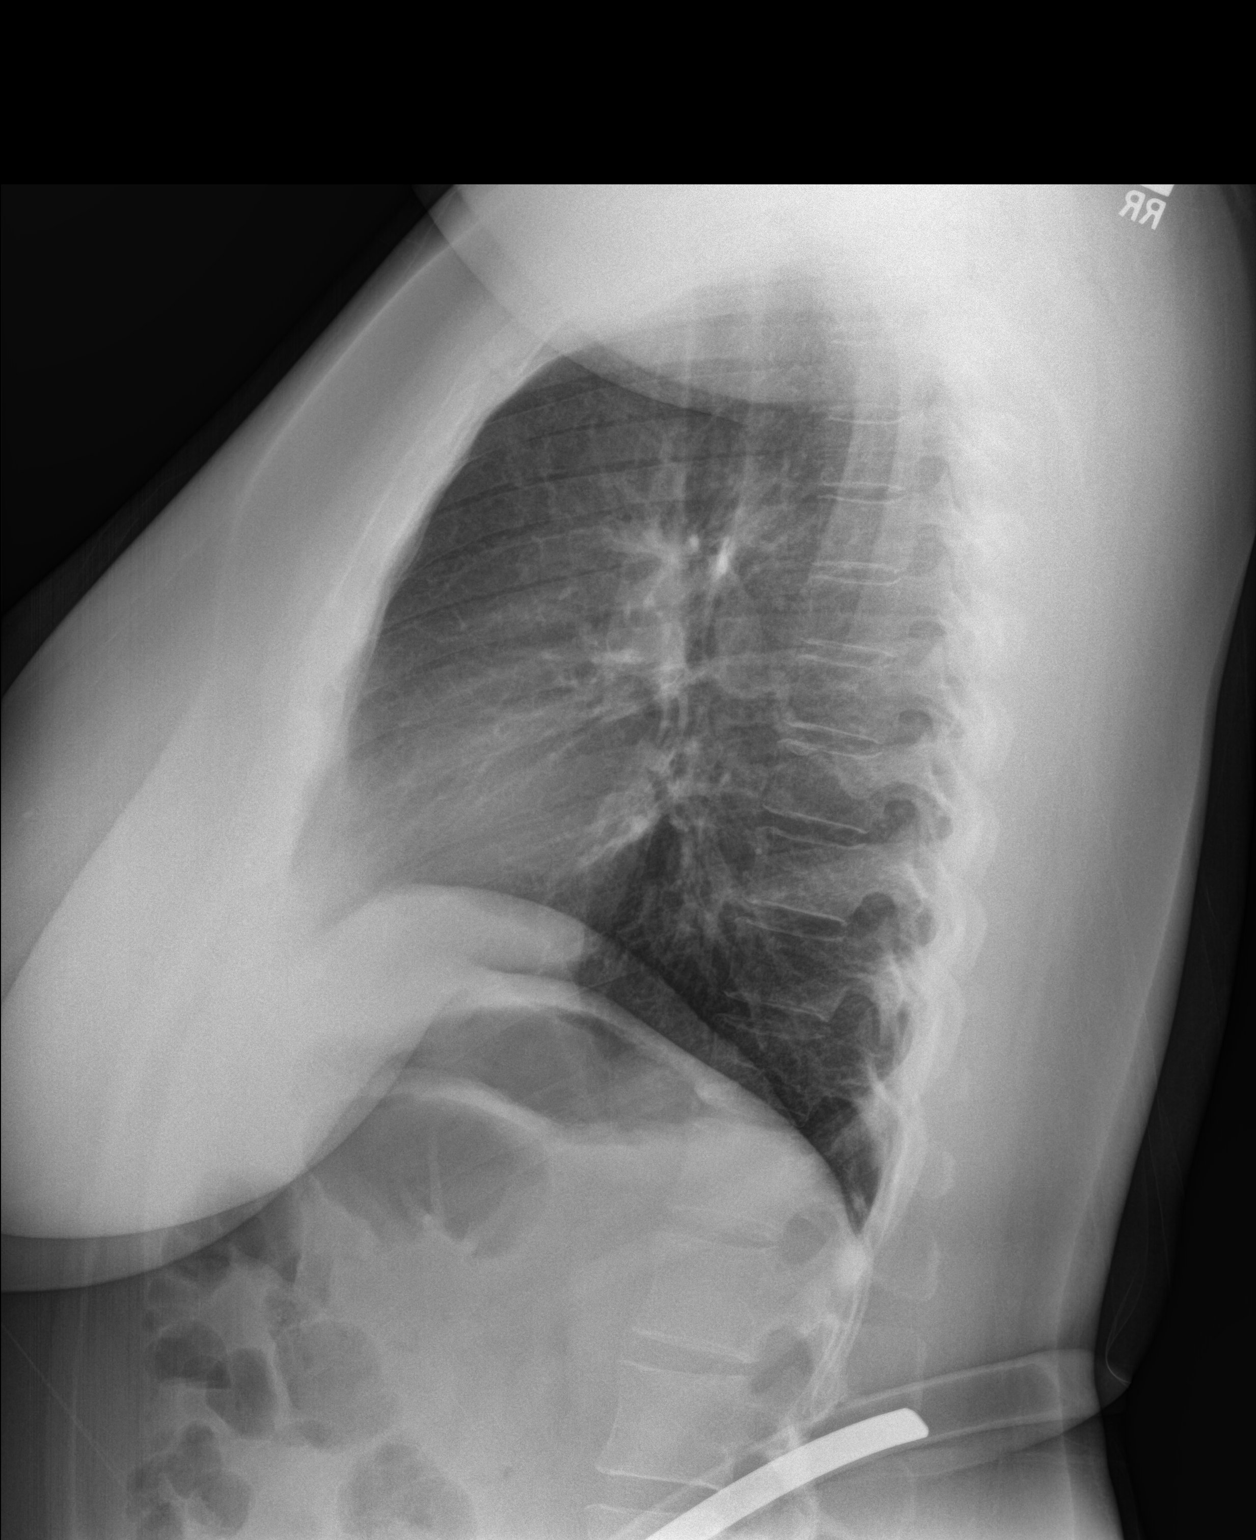

[2 of 2 positions shown; findings below may reference images not displayed]

FINDINGS: The heart size and mediastinal contours are within normal limits.
Both lungs are clear. The visualized skeletal structures are
unremarkable.
IMPRESSION: No active cardiopulmonary disease.

## 2019-04-21 ENCOUNTER — Other Ambulatory Visit: Payer: Self-pay | Admitting: Obstetrics and Gynecology

## 2019-04-21 DIAGNOSIS — Z1231 Encounter for screening mammogram for malignant neoplasm of breast: Secondary | ICD-10-CM

## 2019-04-27 DIAGNOSIS — Z01419 Encounter for gynecological examination (general) (routine) without abnormal findings: Secondary | ICD-10-CM | POA: Diagnosis not present

## 2019-04-27 DIAGNOSIS — N6452 Nipple discharge: Secondary | ICD-10-CM | POA: Diagnosis not present

## 2019-04-27 DIAGNOSIS — Z124 Encounter for screening for malignant neoplasm of cervix: Secondary | ICD-10-CM | POA: Diagnosis not present

## 2019-04-27 DIAGNOSIS — Z6841 Body Mass Index (BMI) 40.0 and over, adult: Secondary | ICD-10-CM | POA: Diagnosis not present

## 2019-05-03 ENCOUNTER — Other Ambulatory Visit: Payer: Self-pay | Admitting: Obstetrics and Gynecology

## 2019-05-03 DIAGNOSIS — N6452 Nipple discharge: Secondary | ICD-10-CM

## 2019-05-17 ENCOUNTER — Other Ambulatory Visit: Payer: Self-pay

## 2019-05-17 ENCOUNTER — Ambulatory Visit
Admission: RE | Admit: 2019-05-17 | Discharge: 2019-05-17 | Disposition: A | Discharge status: Home / Self Care | Payer: Federal, State, Local not specified - PPO | Source: Ambulatory Visit | Attending: Obstetrics and Gynecology | Admitting: Obstetrics and Gynecology

## 2019-05-17 DIAGNOSIS — N6452 Nipple discharge: Secondary | ICD-10-CM | POA: Diagnosis not present

## 2019-05-17 DIAGNOSIS — R922 Inconclusive mammogram: Secondary | ICD-10-CM | POA: Diagnosis not present

## 2019-06-07 ENCOUNTER — Ambulatory Visit: Payer: Federal, State, Local not specified - PPO

## 2019-08-31 ENCOUNTER — Other Ambulatory Visit: Payer: Self-pay | Admitting: Cardiology

## 2019-08-31 DIAGNOSIS — Z20822 Contact with and (suspected) exposure to covid-19: Secondary | ICD-10-CM

## 2019-09-02 LAB — NOVEL CORONAVIRUS, NAA: SARS-CoV-2, NAA: NOT DETECTED

## 2019-09-03 ENCOUNTER — Other Ambulatory Visit: Payer: Self-pay

## 2019-09-03 ENCOUNTER — Encounter: Payer: Self-pay | Admitting: Emergency Medicine

## 2019-09-03 ENCOUNTER — Ambulatory Visit
Admission: EM | Admit: 2019-09-03 | Discharge: 2019-09-03 | Disposition: A | Discharge status: Home / Self Care | Payer: Federal, State, Local not specified - PPO | Attending: Emergency Medicine | Admitting: Emergency Medicine

## 2019-09-03 DIAGNOSIS — J45901 Unspecified asthma with (acute) exacerbation: Secondary | ICD-10-CM

## 2019-09-03 MED ORDER — AEROCHAMBER MV MISC: 0 refills | Status: AC

## 2019-09-03 MED ORDER — PREDNISONE 20 MG PO TABS: 40.0000 mg | ORAL_TABLET | Freq: Every day | ORAL | 0 refills | Status: AC

## 2019-09-03 MED ORDER — BECLOMETHASONE DIPROP HFA 40 MCG/ACT IN AERB: INHALATION_SPRAY | RESPIRATORY_TRACT | 0 refills | Status: AC

## 2019-09-03 MED ORDER — ALBUTEROL SULFATE HFA 108 (90 BASE) MCG/ACT IN AERS: 2.0000 | INHALATION_SPRAY | Freq: Four times a day (QID) | RESPIRATORY_TRACT | 1 refills | Status: AC | PRN

## 2019-09-03 NOTE — ED Triage Notes (Signed)
Pt presents to [facility] for assessment of wheezing, with a hx of asthma, x 1 month.

## 2019-09-03 NOTE — ED Provider Notes (Signed)
[facility]    CSN: [id number] Arrival date & time: 09/03/19  1340      History   Chief Complaint Chief Complaint  Patient presents with  . Asthma    HPI Sherri [facility] is a 43 y.o. female with history of asthma presenting for 1 month course of increased wheezing.  Patient denies cough, fever, known Covid exposure.  Patient states that she underwent Covid testing on Tuesday: Negative-verified by me via chart review at time of appointment.  Patient does note noncompliance with Qvar, has been taking albuterol every 4 hours 3 times daily approximately 4 times weekly with some relief.  Patient is also tried Robitussin, Zyrtec without significant relief.   Past Medical History:  Diagnosis Date  . Allergy   . Asthma   . Blood type, Rh negative   . Chronic bronchitis ([place])    "get it q yr"  . Depression 10/07/1998  . [id number])    "monthly" (12/21/2014)  . Osteoarthritis of ankle, right   . Seasonal allergies   . Substance abuse [facility])     Patient Active Problem List   Diagnosis Date Noted  . Stress fracture, right foot, initial encounter for fracture 08/26/2018  . Arthritis 08/24/2018  . Hypertensive disorder 08/24/2018  . Menorrhagia 08/24/2018  . Pain of breast 08/24/2018  . Dysfunctional uterine bleeding 08/17/2018  . Irregular intermenstrual bleeding 08/17/2018  . History of substance use 08/17/2018  . Osteoarthritis of right foot 06/22/2018  . Seasonal and perennial allergic rhinitis 12/15/2017  . Chronic urticaria 12/15/2017  . Mild persistent asthma, uncomplicated [id number]  . Chronic nonintractable headache 03/10/2017  . Nasal turbinate hypertrophy 03/10/2017  . Anxiety about health 02/17/2017  . Galactorrhea not associated with childbirth 10/28/2016  . Asthma 02/21/2016  . BMI 36.0-36.9,adult 02/21/2016  . Palpitation 12/22/2014  . Elevated troponin 12/21/2014  . Allergic rhinitis 03/06/2014  . Nipple discharge 06/21/2013  .  Hypovitaminosis D 02/09/2013  . Alcohol abuse 02/09/2013  . Breast lump 10/07/2012    Past Surgical History:  Procedure Laterality Date  . BREAST BIOPSY Right ~ 1993   "took out some benign tissue"    OB History    Gravida  9   Para  0   Term      Preterm      AB  8   Living  0     SAB  1   TAB      Ectopic      Multiple      Live Births               Home Medications    Prior to Admission medications   Medication Sig Start Date End Date Taking? Authorizing Provider  albuterol (VENTOLIN HFA) 108 (90 Base) MCG/ACT inhaler Inhale 2 puffs into the lungs every 6 (six) hours as needed for wheezing or shortness of breath. 09/03/19   Hall-Potvin, [place], PA-C  beclomethasone (QVAR) 40 MCG/ACT inhaler INHALE TWO PUFFS INTO THE LUNGS TWO TIMES DAILY 09/03/19   Hall-Potvin, [place], PA-C  cetirizine (ZYRTEC) 10 MG tablet Take 1 tablet (10 mg total) by mouth daily. 08/17/18   Tenna Delaine D, PA-C  fluticasone (FLONASE) 50 MCG/ACT nasal spray Place 2 sprays into both nostrils daily. 08/17/18   Tenna Delaine D, PA-C  furosemide (LASIX) 20 MG tablet Take 1 tablet (20 mg total) by mouth daily for 14 days. 10/19/18 11/02/18  Scot Jun, FNP  Ibuprofen (ADVIL) 200 MG CAPS Take 1-2 capsules by  mouth as needed.     [provider]  Multiple Vitamins-Minerals (MULTIVITAMIN ADULT PO) Take 1 tablet by mouth daily.     [provider]  potassium chloride SA (K-DUR,KLOR-CON) 20 MEQ tablet Take 1 tablet (20 mEq total) by mouth daily. 10/19/18   Scot Jun, FNP  predniSONE (DELTASONE) 20 MG tablet Take 2 tablets (40 mg total) by mouth daily for 5 days. 09/03/19 09/08/19  Hall-Potvin, [place], PA-C  Spacer/Aero-Holding Chambers (AEROCHAMBER MV) inhaler Use as instructed 09/03/19   Hall-Potvin, [place], PA-C  Vitamin D, Ergocalciferol, (DRISDOL) 1.25 MG (50000 UT) CAPS capsule Take 1 capsule (50,000 Units total) by mouth every 7 (seven) days.  10/19/18   Scot Jun, FNP    Family History Family History  Problem Relation Age of Onset  . Heart disease Maternal Grandmother        9 bypass surgery  . Cancer - Other Maternal Grandmother        mouth  . Sickle cell trait Father   . Seizures Father   . Alcohol abuse Father   . Thyroid disease Mother        as a child  . Hypertension Mother   . Anemia Mother   . Migraines Mother   . Hernia Mother   . Diabetes Mother   . Hypertension Paternal Grandmother   . Diabetes Paternal Grandmother   . Arthritis Maternal Aunt     Social History Social History   Tobacco Use  . Smoking status: Former Research scientist (life sciences)  . Smokeless tobacco: Never Used  . Tobacco comment: black and milds  Substance Use Topics  . Alcohol use: Yes    Alcohol/week: 8.0 standard drinks    Types: 8 Shots of liquor per week  . Drug use: Not Currently    Types: Marijuana    Comment: "quit smoking marijuana in 2013"     Allergies   Patient has no known allergies.   Review of Systems Review of Systems  Constitutional: Negative for fatigue and fever.  HENT: Negative for ear pain, sinus pain, sore throat and voice change.   Eyes: Negative for pain, redness and visual disturbance.  Respiratory: Positive for chest tightness and wheezing. Negative for cough, shortness of breath and stridor.   Cardiovascular: Negative for chest pain and palpitations.  Gastrointestinal: Negative for abdominal pain, diarrhea and vomiting.  Musculoskeletal: Negative for arthralgias and myalgias.  Skin: Negative for rash and wound.  Neurological: Negative for syncope and headaches.     Physical Exam Triage Vital Signs ED Triage Vitals  Enc Vitals Group     BP 09/03/19 1356 (!) 148/89     Pulse Rate 09/03/19 1356 (!) 124     Resp 09/03/19 1356 18     Temp 09/03/19 1356 98 F (36.7 C)     Temp Source 09/03/19 1356 Temporal     SpO2 09/03/19 1356 97 %     Weight --      Height --      Head Circumference --       Peak Flow --      Pain Score 09/03/19 1357 0     Pain Loc --      Pain Edu? --      Excl. in [place]? --    No data found.  Updated Vital Signs BP (!) 148/89 (BP Location: Left Arm)   Pulse (!) 108   Temp 98 F (36.7 C) (Temporal)   Resp 18   LMP 08/19/2019  SpO2 97%   Visual Acuity Right Eye Distance:   Left Eye Distance:   Bilateral Distance:    Right Eye Near:   Left Eye Near:    Bilateral Near:     Physical Exam Constitutional:      General: She is not in acute distress.    Appearance: She is obese. She is not ill-appearing.  HENT:     Head: Normocephalic and atraumatic.     Nose: Nose normal.     Mouth/Throat:     Mouth: Mucous membranes are moist.     Pharynx: Oropharynx is clear.  Eyes:     General: No scleral icterus.    Pupils: Pupils are equal, round, and reactive to light.  Neck:     Musculoskeletal: Neck supple. No muscular tenderness.     Comments: Trachea midline, negative JVD Cardiovascular:     Rate and Rhythm: Regular rhythm. Tachycardia present.     Comments: HR 96-104 at bedside Pulmonary:     Effort: Pulmonary effort is normal. No respiratory distress.     Breath sounds: No stridor. Wheezing present. No rhonchi or rales.     Comments: No prolonged expiratory phase or focal adventitious lung sounds Lymphadenopathy:     Cervical: No cervical adenopathy.  Skin:    Capillary Refill: Capillary refill takes less than 2 seconds.     Coloration: Skin is not jaundiced or pale.  Neurological:     General: No focal deficit present.     Mental Status: She is alert and oriented to person, place, and time.      UC Treatments / Results  Labs (all labs ordered are listed, but only abnormal results are displayed) Labs Reviewed - No data to display  EKG   Radiology No results found.  Procedures Procedures (including critical [facility] time)  Medications Ordered in UC Medications - No data to display  Initial Impression / Assessment and Plan / UC  Course  I have reviewed the triage vital signs and the nursing notes.  Pertinent labs & imaging results that were available during my [facility] of the patient were reviewed by me and considered in my medical decision making (see chart for details).     Patient afebrile, nontoxic in office.  No respiratory distress.  H&P consistent with asthma exacerbation, likely second to season change in conjunction with noncompliance of baseline medications.  Reviewed at length importance of Qvar compliance, albuterol administration.  Will refill these medications, provide spacing device, start prednisone.  Return precautions discussed, patient verbalized understanding and is agreeable to plan. Final Clinical Impressions(s) / UC Diagnoses   Final diagnoses:  Asthma with acute exacerbation, unspecified asthma severity, unspecified whether persistent     Discharge Instructions     Please use albuterol inhaler and spacing device as prescribed: May watch YouTube video for additional help. Very important to take Qvar as directed: 2 puffs twice daily for improved baseline functioning. Take prednisone after breakfast daily for the next 5 days. Return for worsening shortness of breath, or development of fever, cough, chest pain.    ED Prescriptions    Medication Sig Dispense Auth. Provider   albuterol (VENTOLIN HFA) 108 (90 Base) MCG/ACT inhaler Inhale 2 puffs into the lungs every 6 (six) hours as needed for wheezing or shortness of breath. 18 g Hall-Potvin, [place], PA-C   beclomethasone (QVAR) 40 MCG/ACT inhaler INHALE TWO PUFFS INTO THE LUNGS TWO TIMES DAILY 10.6 g Hall-Potvin, [place], PA-C   predniSONE (DELTASONE) 20 MG tablet Take  2 tablets (40 mg total) by mouth daily for 5 days. 10 tablet Hall-Potvin, [place], PA-C   Spacer/Aero-Holding Chambers (AEROCHAMBER MV) inhaler Use as instructed 1 each Hall-Potvin, [place], PA-C     PDMP not reviewed this encounter.   Neldon [place], [place] 09/03/19  1923

## 2019-09-03 NOTE — ED Notes (Signed)
Patient able to ambulate independently  

## 2019-09-03 NOTE — Discharge Instructions (Addendum)
Please use albuterol inhaler and spacing device as prescribed: May watch YouTube video for additional help. Very important to take Qvar as directed: 2 puffs twice daily for improved baseline functioning. Take prednisone after breakfast daily for the next 5 days. Return for worsening shortness of breath, or development of fever, cough, chest pain.

## 2019-10-04 ENCOUNTER — Other Ambulatory Visit: Payer: Federal, State, Local not specified - PPO

## 2019-10-05 ENCOUNTER — Ambulatory Visit: Payer: Federal, State, Local not specified - PPO | Attending: Internal Medicine

## 2019-10-05 DIAGNOSIS — Z20822 Contact with and (suspected) exposure to covid-19: Secondary | ICD-10-CM

## 2019-10-05 DIAGNOSIS — Z20828 Contact with and (suspected) exposure to other viral communicable diseases: Secondary | ICD-10-CM | POA: Diagnosis not present

## 2019-10-06 LAB — NOVEL CORONAVIRUS, NAA: SARS-CoV-2, NAA: NOT DETECTED

## 2019-10-11 DIAGNOSIS — D259 Leiomyoma of uterus, unspecified: Secondary | ICD-10-CM | POA: Diagnosis not present

## 2019-10-11 DIAGNOSIS — N83202 Unspecified ovarian cyst, left side: Secondary | ICD-10-CM | POA: Diagnosis not present

## 2019-10-11 DIAGNOSIS — K5790 Diverticulosis of intestine, part unspecified, without perforation or abscess without bleeding: Secondary | ICD-10-CM | POA: Diagnosis not present

## 2019-10-11 DIAGNOSIS — R1013 Epigastric pain: Secondary | ICD-10-CM | POA: Diagnosis not present

## 2019-10-11 DIAGNOSIS — L03311 Cellulitis of abdominal wall: Secondary | ICD-10-CM | POA: Diagnosis not present

## 2019-10-11 DIAGNOSIS — K5730 Diverticulosis of large intestine without perforation or abscess without bleeding: Secondary | ICD-10-CM | POA: Diagnosis not present

## 2019-10-11 DIAGNOSIS — J45909 Unspecified asthma, uncomplicated: Secondary | ICD-10-CM | POA: Diagnosis not present

## 2019-10-11 DIAGNOSIS — K760 Fatty (change of) liver, not elsewhere classified: Secondary | ICD-10-CM | POA: Diagnosis not present

## 2019-10-11 DIAGNOSIS — Z79899 Other long term (current) drug therapy: Secondary | ICD-10-CM | POA: Diagnosis not present

## 2019-10-11 DIAGNOSIS — R1010 Upper abdominal pain, unspecified: Secondary | ICD-10-CM | POA: Diagnosis not present

## 2019-10-12 DIAGNOSIS — N83202 Unspecified ovarian cyst, left side: Secondary | ICD-10-CM | POA: Diagnosis not present

## 2019-10-12 DIAGNOSIS — K5730 Diverticulosis of large intestine without perforation or abscess without bleeding: Secondary | ICD-10-CM | POA: Diagnosis not present

## 2019-10-12 DIAGNOSIS — K760 Fatty (change of) liver, not elsewhere classified: Secondary | ICD-10-CM | POA: Diagnosis not present

## 2019-10-12 DIAGNOSIS — D259 Leiomyoma of uterus, unspecified: Secondary | ICD-10-CM | POA: Diagnosis not present

## 2019-12-13 DIAGNOSIS — M25571 Pain in right ankle and joints of right foot: Secondary | ICD-10-CM | POA: Diagnosis not present

## 2019-12-13 DIAGNOSIS — M19071 Primary osteoarthritis, right ankle and foot: Secondary | ICD-10-CM | POA: Diagnosis not present

## 2019-12-27 DIAGNOSIS — Z0000 Encounter for general adult medical examination without abnormal findings: Secondary | ICD-10-CM | POA: Diagnosis not present

## 2019-12-27 DIAGNOSIS — E559 Vitamin D deficiency, unspecified: Secondary | ICD-10-CM | POA: Diagnosis not present

## 2019-12-27 DIAGNOSIS — R946 Abnormal results of thyroid function studies: Secondary | ICD-10-CM | POA: Diagnosis not present

## 2019-12-27 DIAGNOSIS — Z6841 Body Mass Index (BMI) 40.0 and over, adult: Secondary | ICD-10-CM | POA: Diagnosis not present

## 2019-12-27 DIAGNOSIS — J4540 Moderate persistent asthma, uncomplicated: Secondary | ICD-10-CM | POA: Diagnosis not present

## 2019-12-27 DIAGNOSIS — J302 Other seasonal allergic rhinitis: Secondary | ICD-10-CM | POA: Diagnosis not present

## 2019-12-27 DIAGNOSIS — R7301 Impaired fasting glucose: Secondary | ICD-10-CM | POA: Diagnosis not present

## 2019-12-27 DIAGNOSIS — Z13220 Encounter for screening for lipoid disorders: Secondary | ICD-10-CM | POA: Diagnosis not present

## 2019-12-29 ENCOUNTER — Other Ambulatory Visit: Payer: Self-pay | Admitting: Family Medicine

## 2019-12-29 DIAGNOSIS — L729 Follicular cyst of the skin and subcutaneous tissue, unspecified: Secondary | ICD-10-CM

## 2020-01-24 ENCOUNTER — Other Ambulatory Visit: Payer: Federal, State, Local not specified - PPO

## 2020-07-31 DIAGNOSIS — M79672 Pain in left foot: Secondary | ICD-10-CM | POA: Diagnosis not present

## 2020-07-31 DIAGNOSIS — M7742 Metatarsalgia, left foot: Secondary | ICD-10-CM | POA: Diagnosis not present

## 2020-07-31 DIAGNOSIS — M19071 Primary osteoarthritis, right ankle and foot: Secondary | ICD-10-CM | POA: Diagnosis not present

## 2020-07-31 DIAGNOSIS — M25571 Pain in right ankle and joints of right foot: Secondary | ICD-10-CM | POA: Diagnosis not present

## 2021-05-07 DIAGNOSIS — M25571 Pain in right ankle and joints of right foot: Secondary | ICD-10-CM | POA: Diagnosis not present

## 2021-05-07 DIAGNOSIS — M19071 Primary osteoarthritis, right ankle and foot: Secondary | ICD-10-CM | POA: Diagnosis not present

## 2022-09-11 DIAGNOSIS — M19071 Primary osteoarthritis, right ankle and foot: Secondary | ICD-10-CM | POA: Diagnosis not present

## 2022-09-11 DIAGNOSIS — M25571 Pain in right ankle and joints of right foot: Secondary | ICD-10-CM | POA: Diagnosis not present
